# Patient Record
Sex: Female | Born: 1965 | Race: White | Hispanic: No | Marital: Married | Smoking: Former smoker
Health system: Southern US, Community
[De-identification: ages and names within clinical notes are randomized; demographics above are authoritative.]

## PROBLEM LIST (undated history)

## (undated) DIAGNOSIS — K5790 Diverticulosis of intestine, part unspecified, without perforation or abscess without bleeding: Secondary | ICD-10-CM

## (undated) DIAGNOSIS — F329 Major depressive disorder, single episode, unspecified: Secondary | ICD-10-CM

## (undated) DIAGNOSIS — R112 Nausea with vomiting, unspecified: Secondary | ICD-10-CM

## (undated) DIAGNOSIS — Z9889 Other specified postprocedural states: Secondary | ICD-10-CM

## (undated) DIAGNOSIS — F988 Other specified behavioral and emotional disorders with onset usually occurring in childhood and adolescence: Secondary | ICD-10-CM

## (undated) DIAGNOSIS — K315 Obstruction of duodenum: Secondary | ICD-10-CM

## (undated) DIAGNOSIS — G43909 Migraine, unspecified, not intractable, without status migrainosus: Secondary | ICD-10-CM

## (undated) DIAGNOSIS — F419 Anxiety disorder, unspecified: Secondary | ICD-10-CM

## (undated) DIAGNOSIS — K219 Gastro-esophageal reflux disease without esophagitis: Secondary | ICD-10-CM

## (undated) DIAGNOSIS — R569 Unspecified convulsions: Secondary | ICD-10-CM

## (undated) DIAGNOSIS — I82409 Acute embolism and thrombosis of unspecified deep veins of unspecified lower extremity: Secondary | ICD-10-CM

## (undated) DIAGNOSIS — K859 Acute pancreatitis without necrosis or infection, unspecified: Secondary | ICD-10-CM

## (undated) DIAGNOSIS — J189 Pneumonia, unspecified organism: Secondary | ICD-10-CM

## (undated) DIAGNOSIS — F32A Depression, unspecified: Secondary | ICD-10-CM

## (undated) DIAGNOSIS — K2980 Duodenitis without bleeding: Secondary | ICD-10-CM

## (undated) HISTORY — DX: Acute pancreatitis without necrosis or infection, unspecified: K85.90

## (undated) HISTORY — DX: Migraine, unspecified, not intractable, without status migrainosus: G43.909

## (undated) HISTORY — DX: Duodenitis without bleeding: K29.80

## (undated) HISTORY — DX: Acute embolism and thrombosis of unspecified deep veins of unspecified lower extremity: I82.409

## (undated) HISTORY — PX: APPENDECTOMY: SHX54

## (undated) HISTORY — DX: Major depressive disorder, single episode, unspecified: F32.9

## (undated) HISTORY — DX: Obstruction of duodenum: K31.5

## (undated) HISTORY — PX: CHOLECYSTECTOMY: SHX55

## (undated) HISTORY — DX: Depression, unspecified: F32.A

## (undated) HISTORY — DX: Other specified behavioral and emotional disorders with onset usually occurring in childhood and adolescence: F98.8

## (undated) HISTORY — DX: Diverticulosis of intestine, part unspecified, without perforation or abscess without bleeding: K57.90

## (undated) HISTORY — DX: Gastro-esophageal reflux disease without esophagitis: K21.9

## (undated) HISTORY — DX: Unspecified convulsions: R56.9

---

## 1997-09-27 ENCOUNTER — Inpatient Hospital Stay (HOSPITAL_COMMUNITY): Admission: AD | Admit: 1997-09-27 | Discharge: 1997-09-29 | Payer: Self-pay | Admitting: *Deleted

## 1997-09-30 ENCOUNTER — Encounter: Admission: RE | Admit: 1997-09-30 | Discharge: 1997-12-29 | Payer: Self-pay | Admitting: *Deleted

## 1998-12-25 ENCOUNTER — Other Ambulatory Visit: Admission: RE | Admit: 1998-12-25 | Discharge: 1998-12-25 | Payer: Self-pay | Admitting: *Deleted

## 1999-03-23 ENCOUNTER — Encounter: Payer: Self-pay | Admitting: Emergency Medicine

## 1999-03-23 ENCOUNTER — Emergency Department (HOSPITAL_COMMUNITY): Admission: EM | Admit: 1999-03-23 | Discharge: 1999-03-23 | Payer: Self-pay | Admitting: Emergency Medicine

## 1999-08-04 ENCOUNTER — Encounter: Payer: Self-pay | Admitting: Obstetrics and Gynecology

## 1999-08-04 ENCOUNTER — Inpatient Hospital Stay (HOSPITAL_COMMUNITY): Admission: AD | Admit: 1999-08-04 | Discharge: 1999-08-04 | Payer: Self-pay | Admitting: *Deleted

## 1999-08-05 ENCOUNTER — Inpatient Hospital Stay (HOSPITAL_COMMUNITY): Admission: AD | Admit: 1999-08-05 | Discharge: 1999-08-05 | Payer: Self-pay | Admitting: Obstetrics and Gynecology

## 1999-08-27 ENCOUNTER — Inpatient Hospital Stay (HOSPITAL_COMMUNITY): Admission: AD | Admit: 1999-08-27 | Discharge: 1999-08-29 | Payer: Self-pay | Admitting: *Deleted

## 1999-10-12 ENCOUNTER — Other Ambulatory Visit: Admission: RE | Admit: 1999-10-12 | Discharge: 1999-10-12 | Payer: Self-pay | Admitting: *Deleted

## 1999-10-17 ENCOUNTER — Ambulatory Visit (HOSPITAL_COMMUNITY): Admission: RE | Admit: 1999-10-17 | Discharge: 1999-10-18 | Payer: Self-pay | Admitting: General Surgery

## 1999-10-17 ENCOUNTER — Encounter (INDEPENDENT_AMBULATORY_CARE_PROVIDER_SITE_OTHER): Payer: Self-pay | Admitting: *Deleted

## 2000-10-13 ENCOUNTER — Other Ambulatory Visit: Admission: RE | Admit: 2000-10-13 | Discharge: 2000-10-13 | Payer: Self-pay | Admitting: *Deleted

## 2001-01-07 ENCOUNTER — Encounter: Payer: Self-pay | Admitting: *Deleted

## 2001-01-07 ENCOUNTER — Encounter: Admission: RE | Admit: 2001-01-07 | Discharge: 2001-01-07 | Payer: Self-pay | Admitting: *Deleted

## 2004-10-30 ENCOUNTER — Other Ambulatory Visit: Admission: RE | Admit: 2004-10-30 | Discharge: 2004-10-30 | Payer: Self-pay | Admitting: Obstetrics and Gynecology

## 2007-07-17 ENCOUNTER — Ambulatory Visit: Payer: Self-pay | Admitting: Internal Medicine

## 2007-07-21 ENCOUNTER — Ambulatory Visit: Payer: Self-pay | Admitting: Cardiology

## 2007-12-01 ENCOUNTER — Emergency Department (HOSPITAL_COMMUNITY): Admission: EM | Admit: 2007-12-01 | Discharge: 2007-12-01 | Payer: Self-pay | Admitting: Family Medicine

## 2007-12-04 ENCOUNTER — Emergency Department (HOSPITAL_COMMUNITY): Admission: EM | Admit: 2007-12-04 | Discharge: 2007-12-04 | Payer: Self-pay | Admitting: Emergency Medicine

## 2008-02-09 ENCOUNTER — Emergency Department (HOSPITAL_COMMUNITY): Admission: EM | Admit: 2008-02-09 | Discharge: 2008-02-09 | Payer: Self-pay | Admitting: Emergency Medicine

## 2008-02-23 ENCOUNTER — Ambulatory Visit: Payer: Self-pay | Admitting: Vascular Surgery

## 2008-02-23 ENCOUNTER — Encounter (INDEPENDENT_AMBULATORY_CARE_PROVIDER_SITE_OTHER): Payer: Self-pay | Admitting: Orthopaedic Surgery

## 2008-02-23 ENCOUNTER — Ambulatory Visit: Admission: RE | Admit: 2008-02-23 | Discharge: 2008-02-23 | Payer: Self-pay | Admitting: Orthopaedic Surgery

## 2009-08-21 ENCOUNTER — Telehealth: Payer: Self-pay | Admitting: Internal Medicine

## 2009-08-21 ENCOUNTER — Ambulatory Visit: Payer: Self-pay | Admitting: Gastroenterology

## 2009-08-21 DIAGNOSIS — R112 Nausea with vomiting, unspecified: Secondary | ICD-10-CM

## 2009-08-21 DIAGNOSIS — R1013 Epigastric pain: Secondary | ICD-10-CM | POA: Insufficient documentation

## 2009-08-22 ENCOUNTER — Ambulatory Visit (HOSPITAL_COMMUNITY): Admission: RE | Admit: 2009-08-22 | Discharge: 2009-08-22 | Payer: Self-pay | Admitting: Gastroenterology

## 2009-08-22 LAB — CONVERTED CEMR LAB
Albumin: 4.5 g/dL (ref 3.5–5.2)
Alkaline Phosphatase: 52 units/L (ref 39–117)
BUN: 15 mg/dL (ref 6–23)
Basophils Relative: 0.8 % (ref 0.0–3.0)
Eosinophils Relative: 1.8 % (ref 0.0–5.0)
GFR calc non Af Amer: 97 mL/min (ref >60)
Glucose, Bld: 90 mg/dL (ref 70–99)
MCV: 96.9 fL (ref 78.0–100.0)
Monocytes Absolute: 0.4 10*3/uL (ref 0.1–1.0)
Monocytes Relative: 5.2 % (ref 3.0–12.0)
Neutrophils Relative %: 65.5 % (ref 43.0–77.0)
RBC: 4.12 M/uL (ref 3.87–5.11)
Total Bilirubin: 0.8 mg/dL (ref 0.3–1.2)
WBC: 6.8 10*3/uL (ref 4.5–10.5)

## 2009-08-29 ENCOUNTER — Encounter: Payer: Self-pay | Admitting: Internal Medicine

## 2009-08-29 ENCOUNTER — Telehealth: Payer: Self-pay | Admitting: Physician Assistant

## 2009-08-30 ENCOUNTER — Ambulatory Visit: Payer: Self-pay | Admitting: Gastroenterology

## 2009-08-30 ENCOUNTER — Encounter: Payer: Self-pay | Admitting: Internal Medicine

## 2009-08-31 ENCOUNTER — Telehealth: Payer: Self-pay | Admitting: Internal Medicine

## 2009-09-04 ENCOUNTER — Ambulatory Visit (HOSPITAL_COMMUNITY): Admission: RE | Admit: 2009-09-04 | Discharge: 2009-09-04 | Payer: Self-pay | Admitting: Internal Medicine

## 2009-09-05 ENCOUNTER — Ambulatory Visit: Payer: Self-pay | Admitting: Internal Medicine

## 2009-09-05 DIAGNOSIS — R1011 Right upper quadrant pain: Secondary | ICD-10-CM

## 2009-09-06 ENCOUNTER — Encounter: Payer: Self-pay | Admitting: Nurse Practitioner

## 2009-09-06 ENCOUNTER — Telehealth: Payer: Self-pay | Admitting: Nurse Practitioner

## 2009-09-25 ENCOUNTER — Ambulatory Visit: Payer: Self-pay | Admitting: Internal Medicine

## 2009-09-26 LAB — CONVERTED CEMR LAB
ALT: 24 units/L (ref 0–35)
AST: 23 units/L (ref 0–37)
Alkaline Phosphatase: 55 units/L (ref 39–117)
Amylase: 75 units/L (ref 27–131)
Bilirubin, Direct: 0.1 mg/dL (ref 0.0–0.3)
Total Bilirubin: 0.5 mg/dL (ref 0.3–1.2)

## 2010-09-11 NOTE — Assessment & Plan Note (Signed)
Summary: WORSENING EPISODES OF EPIGASTRIC PAIN,N/V    (DR.BRODIE PT.) ...   History of Present Illness Visit Type: Follow-up Visit Primary GI MD: Lina Sar MD Primary Provider: Dory Peru Requesting Provider: n/a Chief Complaint: Nausea and vomiting with abdominal pain that radiates to her chest, pt states she had a HPylori test with Dr Norma Fredrickson that was neg. 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 SHE WAS ABLE TO COME OFF MEDS AND DID NOT HAVE ANY SXS REFERRABLE TO IBD FOR MANY YEARS. SHE WAS SEEN BY DR Juanda Chance IN 2008-HAD CT ABDOMEN PELVIS WHICH WAS NORMAL. 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 SHE DID HAVE GALLSTONES. SHE COMES IN TODAY WITH C/O EPIGASTRIC PAIN, NAUSEA AND VOMIITNG WHICH HAS OCCURRED IN ATTACKS WHICH REMINDS HER OF HER GB SXS. SHE HAS AN EPISODE THIS SUMMER, ONE IN THE FALL AND ANOTHER ABOUT A WEEK AGO. SHE GETS HORRIBLE EPIGASTRIC PAIN THAT RADIATES INTO RIGHT SIDE UNDER BREAST AND SOMETIMES INTO RIGHT SHOULDER OR MID BACK-THIS IS ASSOCIATED WITH NAUSEA AND MULTIPLE EPISODES OF VOMITING. THIS LASTS A DAY THEN SHE WILL FEEL SORE AND NAUSEATED FOR ABOUT A WEEK.. NO FEVER. SHE FEELS 'UNSETTLED', 'ACIDY' IN HER STOMACH. HAS BEEN ON PREVACID FOR A FEW MONTHS, THINKS IT HELPS A LITTLE.  NO REGULAR ASA OR NSAIDS.    GI Review of Systems    Reports abdominal pain, belching, nausea, and  vomiting.     Location of  Abdominal pain: epigastric area.    Denies acid reflux, bloating, chest pain, dysphagia with liquids, dysphagia with solids, heartburn, loss of appetite, vomiting blood, and  weight loss.        Denies anal fissure, black tarry stools, change in bowel habit, constipation, diarrhea, diverticulosis, fecal incontinence,  heme positive stool, hemorrhoids, irritable bowel syndrome, jaundice, light color stool, liver problems, rectal bleeding, and  rectal pain. Preventive Screening-Counseling & Management  Alcohol-Tobacco     Smoking Status: quit      Drug Use:  no.     Current Medications (verified): 1)  Prevacid 30 Mg Cpdr (Lansoprazole) .... Take 1 Tab 30 Min Prior To Breakfast 2)  Promethazine Hcl 25 Mg Tabs (Promethazine Hcl) .... Take 1 Tab Every 6 Hours As Needed For Nausea.  Allergies (verified): No Known Drug Allergies  Past History:  Past Medical History: MIGRAINE HEADACHES  Past Surgical History: Reviewed history from 08/29/2007 and no changes required. Appendectomy Cholecystectomy  Family History: Family History of Colon Cancer: cousin Mother had lung cancer Family History of Diabetes:  father Family History of Heart Disease: fatherv  Social History: [name].  Alcohol Use - yes  occ Daily Caffeine Use coffee daily Illicit Drug Use - no Smoking Status:  quit Drug Use:  no  Review of Systems  The patient denies allergy/sinus, anemia, anxiety-new, arthritis/joint pain, back pain, blood in urine, breast changes/lumps, change in vision, confusion, cough, coughing up blood, depression-new, fainting, fatigue, fever, headaches-new, hearing problems, heart murmur, heart rhythm changes, itching, menstrual pain, muscle pains/cramps, night sweats, nosebleeds, pregnancy symptoms, shortness of breath, skin rash, sleeping problems, sore throat, swelling of feet/legs, swollen lymph glands, thirst - excessive, urination - excessive, urination changes/pain, urine leakage, vision changes, and voice change.  Vital Signs:  Patient profile:   45 year old female Height:      64 inches Weight:      144 pounds BMI:     24.81 BSA:     1.70 Pulse rate:   78 / minute Pulse rhythm:   regular BP sitting:   84 / 60  (left arm)  Vitals Entered By: Merri Ray CMA Duncan Dull)  (August 21, 2009 3:12 PM)  Physical Exam  General:  Well developed, well nourished, no acute distress. Head:  Normocephalic and atraumatic. Eyes:  PERRLA, no icterus. Lungs:  Clear throughout to auscultation. Heart:  Regular rate and rhythm; no murmurs, rubs,  or bruits. Abdomen:  soft, tender epigastrium,and ruq,no guarding,exam brought tears.Marland Kitchen bs+ no mass or hsm Rectal:  not done Extremities:  No clubbing, cyanosis, edema or deformities noted. Neurologic:  Alert and  oriented x4;  grossly normal neurologically. Psych:  Alert and cooperative. Normal mood and affect.   Impression & Recommendations:  Problem # 1:  ABDOMINAL PAIN, EPIGASTRIC (ICD-789.06) Assessment Deteriorated [name] S/P CHOLECYTECTOMY AND APPENDECTOMY WITH  EPISODIC EPIGASTRIC PAIN, NAUSEA AND VOMITING. R/O CHOLEDOCHOLITHIASIS, SPHINCTER OF ODDI DYSFUNCTION, IBS, PARTIAL OBSTRUCTION LABS AS BELOW RESUME PREVACID 30 MG DIALY IN AM BLAND DIET SCHEDULE UPPER ABDOMINAL ULTRASOUND,MAY NEED MRCP VS ERCP PENDING FINDINGS PHENERGAN 25 MG Q 6 HOURS AS NEEDED FOR NAUSEA Orders: TLB-CMP (Comprehensive Metabolic Pnl) (80053-COMP) TLB-CBC Platelet - w/Differential (85025-CBCD) TLB-Lipase (83690-LIPASE) Ultrasound Abdomen (UAS)  Patient Instructions: 1)  Please go to the lab, basement level. 2)  We scheduled an Ultrasound for tomorrow 08-22-09. Arrive at [facility] 1st floor at 10:45AM. 3)  We sent a perscription for Phenergan for Nausea to your pharmacy. 4)  We have given you samples of Prevacid, take 1 capsule 30 min before breakfast. 5)  Copy sent to : [name], MD 6)  The medication list was reviewed and reconciled.  All changed / newly prescribed medications were explained.  A complete medication list was provided to the patient / caregiver.  Prescriptions: PROMETHAZINE HCL 25 MG TABS (PROMETHAZINE HCL) Take 1 tab every 6 hours as needed for nausea.  #60 x 0   Entered by:   Lowry Ram  NCMA   Authorized by:   Sammuel Cooper PA-c   Signed by:   Lowry Ram NCMA on 08/21/2009   Method used:   Electronically to        [facility]  [id number] (retail)       [address]       [place], [place]  [address]       Ph: [phone] or [phone]       Fax: [phone]   RxID:   [id number]

## 2010-09-11 NOTE — Progress Notes (Signed)
Summary: MRCP Scheduled  Phone Note Outgoing Call   Call placed by: Laureen Ochs LPN,  August 31, 2009 2:27 PM Call placed to: Patient Summary of Call: Pt. needs an MRCP and OV. She is scheduled at [facility] on 09-04-09 at [facility] for the MRCP and will f/u w/Dr.Dayelin Balducci on 09-25-09 at 2:15am. All instructions reviewed w/pt. by phone. Pt. instructed to call back as needed.  Initial call taken by: Laureen Ochs LPN,  August 31, 2009 2:35 PM

## 2010-09-11 NOTE — Letter (Signed)
Summary: Out of [facility]  [address] [place], [place] [address]   Phone: [phone]  Fax: [phone]    September 06, 2009   Student:  Alycia Patten Ficken    To Whom It May Concern:   For Medical reasons, please excuse the above named student from school for the following dates:              Our patient Cassie Berry had an Endoscopy on 08-30-09.    Our patient Cassie Berry had a procedure, MRCP,  on September 04, 2009 .             I also saw [name] in our office on 09-05-09.                If you need additional information, please feel free to contact our office.   Sincerely,        ****This is a legal document and cannot be tampered with.  Schools are authorized to verify all information and to do so accordingly.

## 2010-09-11 NOTE — Procedures (Signed)
Summary: Upper Endoscopy  Patient: Cassie Berry Note: All result statuses are Final unless otherwise noted.  Tests: (1) Upper Endoscopy (EGD)   EGD Upper Endoscopy       DONE     [facility]     [address]     [place], [place]  [address]           ENDOSCOPY PROCEDURE REPORT           PATIENT:  [name], [name]  MR#:  [id number]     BIRTHDATE:  11-25-1965, 43 yrs. old  GENDER:  female           ENDOSCOPIST:  Judie Petit T. Russella Dar, MD, [facility]           PROCEDURE DATE:  08/30/2009     PROCEDURE:  EGD, diagnostic     ASA CLASS:  Class I     INDICATIONS:  abdominal pain, right upper quad, chest pain, nausea     and vomiting           MEDICATIONS:  Fentanyl 50 mcg IV, Versed 8 mg IV     TOPICAL ANESTHETIC:  Exactacain Spray           DESCRIPTION OF PROCEDURE:   After the risks benefits and     alternatives of the procedure were thoroughly explained, informed     consent was obtained.  The LB [id number] endoscope was     introduced through the mouth and advanced to the second portion of     the duodenum, without limitations.  The instrument was slowly     withdrawn as the mucosa was fully examined.     <<PROCEDUREIMAGES>>           The esophagus and gastroesophageal junction were completely normal     in appearance. The stomach was entered and closely examined. The     pylorus, antrum, angularis, and lesser curvature were well     visualized, including a retroflexed view of the cardia and fundus.     The stomach wall was normally distensable. The scope passed easily     through the pylorus into the duodenum. The duodenal bulb was     normal in appearance, as was the postbulbar duodenum. Retroflexed     views revealed no abnormalities.  The scope was then withdrawn     from the patient and the procedure completed.           COMPLICATIONS:  None           ENDOSCOPIC IMPRESSION:     1) Normal EGD           RECOMMENDATIONS:     1) Continue current medications     2)  My office will arrange for you to have an MRCP performed for     recurrent RUQ pain, N/V, postcholecystectomy. This is a     specialized MRI test of your bile and pancreatic ducts.     3) Call office in the next few days to schedule an office     appointment for 2-3 weeks with Dr. Jettie Pagan T. Russella Dar, MD, [facility]           CC:  Romero Liner, MD      Lina Sar, MD           n.     Rosalie DoctorVenita Lick. Bay Jarquin at 08/30/2009 03:23 PM  [place], [place], [phone]  Note: An exclamation mark (!) indicates a result that was not dispersed into the flowsheet. Document Creation Date: 08/30/2009 3:24 PM _______________________________________________________________________  (1) Order result status: Final Collection or observation date-time: 08/30/2009 15:19 Requested date-time:  Receipt date-time:  Reported date-time:  Referring Physician:   Ordering Physician: Claudette Head [phone]) Specimen Source:  Source: Launa Grill Order Number: [id number] Lab site:

## 2010-09-11 NOTE — Progress Notes (Signed)
Summary: TRIAGE  Phone Note Call from Patient Call back at [phone]   Caller: Patient Call For: Dr. Juanda Chance Summary of Call: Pt is having alot of abdominal pain and nausea. Scheduled an appt. for 09-25-09 but wants to know if she can be worked in sooner. Initial call taken by: Karna Christmas,  August 21, 2009 10:14 AM  Follow-up for Phone Call        Pt. c/o intermittent epigastric pain, when she has these episodes the pain is under her rib cage and travels to the center of her chest. Pain is in the middle and goes to her right. N/V, bloating and belching with the pain.  Occ. constipation. Denies fever, diarrhea, blood, black stools. Was started on Prevacid in July, helped at first, but not recently. States these episodes are getting more frequent and more severe, had one this weekend.  Pt. will see Mike Gip [facility] today at 3pm.  Follow-up by: Laureen Ochs LPN,  August 21, 2009 10:48 AM

## 2010-09-11 NOTE — Progress Notes (Signed)
Summary: Needs a letter for school  Phone Note Call from Patient Call back at Home Phone [phone]   Call For: Cassie Berry Summary of Call: Was seen here yesterday and needs a letter for her school. would really like to speak to [facility] first. Initial call taken by: Leanor Kail [facility],  September 06, 2009 12:15 PM  Follow-up for Phone Call        Gave pt out of school letter for several recent dates.  Put at front desk today. Follow-up by: Joselyn Glassman,  September 06, 2009 12:40 PM

## 2010-09-11 NOTE — Assessment & Plan Note (Signed)
Summary: ABDOMINAL PAIN--CH.   History of Present Illness Visit Type: Follow-up Visit Primary GI MD: Lina Sar MD Primary Provider: [name], MD Requesting Provider: n/a Chief Complaint: midline abdominal pain that radiates to right side.  Since making appt pain is not radiating as much. History of Present Illness:   45 y.o. white female with episodic epigastric pain localized to the right of the epigastrium and under the right costal margin resembling prior  gallbladder attack. Upper endoscopy by Dr. Russella Dar was normal and MRCP was also normal including common bile duct and pancreas. She had a  laparoscopic cholecystectomy 2001 for cholelithiasis which was uncomplicated. CT Scan of the abdomen in December 2008 was normal as a positive family history for colon cancer in paternal grandfather. Patient underwent acute explorratory laparotomy in 1989  while vacationing at the beach was no definite diagnosis made and she underwent to incidental appendectomy   GI Review of Systems    Reports abdominal pain and  belching.     Location of  Abdominal pain: generalized.    Denies acid reflux, bloating, chest pain, dysphagia with liquids, dysphagia with solids, heartburn, loss of appetite, nausea, vomiting, vomiting blood, weight loss, and  weight gain.        Denies anal fissure, black tarry stools, change in bowel habit, constipation, diarrhea, diverticulosis, fecal incontinence, heme positive stool, hemorrhoids, irritable bowel syndrome, jaundice, light color stool, liver problems, rectal bleeding, and  rectal pain.    Current Medications (verified): 1)  Prevacid 30 Mg Cpdr (Lansoprazole) .... Take 1 Tab 30 Min Prior To Breakfast 2)  Promethazine Hcl 25 Mg Tabs (Promethazine Hcl) .... Take 1 Tab Every 6 Hours As Needed For Nausea. 3)  Levbid 0.375 Mg Xr12h-Tab (Hyoscyamine Sulfate) .... Take 1 Tab Twice Daily 4)  Epidrin 325-65-100 Mg Caps (Apap-Isometheptene-Dichloral) .... As Needed For  Migraines  Allergies (verified): 1)  ! Macrodantin  Past History:  Past Medical History: Reviewed history from 09/19/2009 and no changes required. MIGRAINE HEADACHES ABDOMINAL PAIN-RUQ (ICD-789.01) NAUSEA AND VOMITING (ICD-787.01) ABDOMINAL PAIN, EPIGASTRIC (ICD-789.06) COLORECTAL CANCER, FAMILY HX (ICD-V16.0)    Past Surgical History: Reviewed history from 08/29/2007 and no changes required. Appendectomy Cholecystectomy  Family History: Reviewed history from 09/19/2009 and no changes required. Family History of Colon Cancer: cousin, Paternal Grandfather Mother had lung cancer Family History of Diabetes:  father Family History of Heart Disease: father Family History of Prostate Cancer: Grandfather Family History of Breast Cancer: Maternal Aunt  Social History: Reviewed history from 08/21/2009 and no changes required. [name].  Alcohol Use - yes  occ Daily Caffeine Use coffee daily Illicit Drug Use - no  Review of Systems  The patient denies allergy/sinus, anemia, anxiety-new, arthritis/joint pain, back pain, blood in urine, breast changes/lumps, confusion, cough, coughing up blood, depression-new, fainting, fatigue, fever, headaches-new, hearing problems, heart murmur, heart rhythm changes, itching, menstrual pain, muscle pains/cramps, night sweats, nosebleeds, pregnancy symptoms, shortness of breath, skin rash, sleeping problems, sore throat, swelling of feet/legs, swollen lymph glands, thirst - excessive, urination - excessive, urination changes/pain, urine leakage, vision changes, and voice change.         Pertinent positive and negative review of systems were noted in the above HPI. All other ROS was otherwise negative.   Vital Signs:  Patient profile:   45 year old female Height:      64 inches Weight:      144 pounds BMI:     24.81 Pulse rate:   76 /  minute Pulse rhythm:   regular BP sitting:   100 / 66  (left arm) Cuff size:    regular  Vitals Entered By: Francee Piccolo CMA Duncan Dull) (September 25, 2009 2:29 PM)  Physical Exam  General:  Well developed, well nourished, no acute distress. Eyes:  PERRLA, no icterus. Neck:  Supple; no masses or thyromegaly. Lungs:  Clear throughout to auscultation. Heart:  Regular rate and rhythm; no murmurs, rubs,  or bruits. Abdomen:  tenderness right upper quadrant under the right costal margin. No palpable mass. Normoactive bowel sounds. Left lower and upper quadrants unremarkable. There is no CVA tenderness Extremities:  No clubbing, cyanosis, edema or deformities noted. Skin:  Intact without significant lesions or rashes. Psych:  Alert and cooperative. Normal mood and affect.   Impression & Recommendations:  Problem # 1:  ABDOMINAL PAIN-RUQ (ICD-789.01)  Oepisodic right upper quadrant abdominal pain consistent with biliary colic in a patient who is status post laparoscopic cholecystectomy 2001 , I would consider all possibility of biliary dyskinesia or on sphincter of OD dysfunction or possibly choledocholithiasis. We would have to document abnormal liver function tests. She's doing fine today. Patient will remain on Prevacid 30 mg a day and low-fat diet. She was notified us if she has another attack and we will draw liver function tests. If she develops an acute attack she should  take Phenergan 12 and half milligrams and Levbid 0.375 mgrders: TLB-Hepatic/Liver Function Pnl (80076-HEPATIC) TLB-Amylase (82150-AMYL) TLB-Lipase (83690-LIPASE)  Problem # 2:  NAUSEA AND VOMITING (ICD-787.01) usually associated with abdominal pain may be treated symptomatically Orders: TLB-Hepatic/Liver Function Pnl (80076-HEPATIC) TLB-Amylase (82150-AMYL) TLB-Lipase (83690-LIPASE)  Problem # 3:  COLORECTAL CANCER, FAMILY HX (ICD-V16.0) recommend colonoscopy at age 89  Patient Instructions: 1)  colonoscopy at  age 67 2)  Low-fat diet 3)  Liver function tests today and while she's having  abdominal pain to  to rule out biliary colic, if elevated, will likely need ERCP/sphincterotomy 4)  Take Phenergan or  Levbid .375 mg p.r.n. abdominal pain 5)  Copy sent to : Dr W.Pharr

## 2010-09-11 NOTE — Progress Notes (Signed)
Summary: abdominal pain  Phone Note Call from Patient   Summary of Call: Some increase in the pain she has had for 3 weeks and in epigastrium with some radiation to back. No fever, nausea or vomiting at this time. Has just had [place], labs and EGD which were unrevealing (reviewed). Was able to work today but it was difficult. Plan for now is for her to go on liquid diet abnd we will call her tomorow AM re: follow-up needs Told  to go to ED if worse, fever, intractable vomiting  Initial call taken by: Iva Boop MD, [facility],  August 31, 2009 7:40 PM     Appended Document: abdominal pain Pt. has an MRCP scheduled for 09-04-09. Message left for patient to callback.  Appended Document: abdominal pain Message left for patient to callback.  Appended Document: abdominal pain Pt. states she is still hurting, but she is improved today.She will keep appt. for MRCP on 09-04-09. If symptoms become worse call back immediately or go to ER.

## 2010-09-11 NOTE — Progress Notes (Signed)
Summary: Urgent appt. -- Abdominal pain.  Phone Note From Other Clinic Call back at [phone]   Caller: Patient Caller: Nurse -Dr. Renne Crigler office.  Summary of Call: patient was just seen by Dr. Renne Crigler today and Dr. Renne Crigler states that her abdominal pain is I related and want patient to be seen again in GI within the next two days. I advised Dr. Shellia Cleverly nurse that the patient has severl up coming things scheduled already including a EGD and a follow up visit. She says she knows but wants patient seen again anyway. I told her I will have this note forwarded to the nurse and have someone call the patient.  Initial call taken by: Harlow Mares CMA Duncan Dull),  August 29, 2009 4:36 PM  Follow-up for Phone Call        Pt. was seen 08-21-09 by Mike Gip PAC.   AMY--PLEASE REVIEW AND ADVISE.   Follow-up by: Laureen Ochs LPN,  August 29, 2009 4:44 PM  Additional Follow-up for Phone Call Additional follow up Details #1::        SHERI,I SPOKE TO DR Russella Dar WHO SUPERVISED WITH ME ON THIS PT . SHE IS SET UP FOR EGD WITH DB 2/8,DR. STARK SAYS HE CAN DO IT THIS WEEK IF YOU CAN FIND A SPOT. PLEASE CALL PT IN AM- I DO NOT NEED TO SEE HER IN OFFICE. Additional Follow-up by: Peterson Ao,  August 29, 2009 5:12 PM     Appended Document: Urgent appt. -- Abdominal pain. I spoke with the patient this am she will come for EGD today with Dr Russella Dar at 3:30.  Patient  verbalized understanding of verbal instructions.   Clinical Lists Changes  Orders: Added new Test order of EGD (EGD) - Signed      Appended Document: Urgent appt. -- Abdominal pain. reviewed and agree.DB

## 2010-09-11 NOTE — Assessment & Plan Note (Signed)
Summary: ONGOING ABD.PAIN,N/V          (DR.BRODIE PT.)    Cassie Berry   History of Present Illness Primary GI MD: Lina Sar MD Primary Provider: Dory Peru Requesting Provider: n/a Chief Complaint: Ongoing RUQ dull pain and tenderness that can radiate to her chest and shoulder blades. Pt also still has some nausea. Pt states the phenergan is the only thing that helps.  History of Present Illness:   Cassie Berry is back for persistent upper abdominal pain for which she saw Amy Esterwood, P.A early January. U/S, EGD and labs normal. She describes RUQ "gnawing" which often radiates around right flank and sometimes up into chest. Pain not related to meals though she has early satiety without weight loss. The pain occurs almost daily, is constant when it occurs and pretty much lasts all day. Pain not exacerbated by physical activity. Her nausea and vomiting are better. She still takes a Phenergan at night, as well as uses a heating pad to promote sleep and not be woken up by the pain. Prevacid recently increased to twice daily, she hasn't noticed any improvement.    Cassie Berry was originally seen by Dr. Juanda Chance in 2008 for questionable Crohn's. In 1989, while away on her honeymoon, patient developed severe RLQ pain, had emergent exploratory laparotomy for presumed appendicitis. An appendectomy was carried out, but the findings of the surgery, according to the patient, were inflammation in her intestines.  She was subsequently treated for Crohn's by Dr. Victorino Dike. After two years patient felt fine and discontinued medications. Sounds like patient never convinced she had IBD and we never recieved records.  In 1995 or so, she saw Dr. Sherin Quarry, just one time in the office.  No tests were done. Though no major GI complaints at the time, patient saw Dr. Juanda Chance Dec. 2008 to determine whether she had Crohn's. A CTscan at that time was okay. No further GI problems until recent development of RUQ pain.  Patient has  normal BMs, no hematochezia. Weight is stable.     GI Review of Systems    Reports abdominal pain, chest pain, and  nausea.     Location of  Abdominal pain: RUQ.    Denies acid reflux, belching, bloating, dysphagia with liquids, dysphagia with solids, heartburn, loss of appetite, vomiting, vomiting blood, weight loss, and  weight gain.        Denies anal fissure, black tarry stools, change in bowel habit, constipation, diarrhea, diverticulosis, fecal incontinence, heme positive stool, hemorrhoids, irritable bowel syndrome, jaundice, light color stool, liver problems, rectal bleeding, and  rectal pain.    Current Medications (verified): 1)  Prevacid 30 Mg Cpdr (Lansoprazole) .... Take 1 Tab 30 Min Prior To Breakfast 2)  Promethazine Hcl 25 Mg Tabs (Promethazine Hcl) .... Take 1 Tab Every 6 Hours As Needed For Nausea.  Allergies (verified): 1)  ! Macrodantin  Past History:  Past Medical History: Reviewed history from 08/21/2009 and no changes required. MIGRAINE HEADACHES  Past Surgical History: Reviewed history from 08/29/2007 and no changes required. Appendectomy Cholecystectomy  Family History: Reviewed history from 08/21/2009 and no changes required. Family History of Colon Cancer: cousin Mother had lung cancer Family History of Diabetes:  father Family History of Heart Disease: fatherv  Social History: Reviewed history from 08/21/2009 and no changes required. [name].  Alcohol Use - yes  occ Daily Caffeine Use coffee daily Illicit Drug Use - no  Review of Systems  The patient denies allergy/sinus,  anemia, anxiety-new, arthritis/joint pain, back pain, blood in urine, breast changes/lumps, change in vision, confusion, cough, coughing up blood, depression-new, fainting, fatigue, fever, headaches-new, hearing problems, heart murmur, heart rhythm changes, itching, menstrual pain, muscle pains/cramps, night sweats, nosebleeds, pregnancy symptoms,  shortness of breath, skin rash, sleeping problems, sore throat, swelling of feet/legs, swollen lymph glands, thirst - excessive , urination - excessive , urination changes/pain, urine leakage, vision changes, and voice change.    Vital Signs:  Patient profile:   45 year old female Height:      64 inches Weight:      145.38 pounds BMI:     25.04 Pulse rate:   80 / minute Pulse rhythm:   regular BP sitting:   104 / 60  (left arm) Cuff size:   regular  Vitals Entered By: Christie Nottingham CMA Duncan Dull) (September 05, 2009 9:07 AM)  Physical Exam  General:  Well developed, well nourished, no acute distress. Eyes:  Conjunctiva pink, no icterus.  Neck:  no obvious masses  Lungs:  Clear throughout to auscultation. Heart:  Regular rate and rhythm; no murmurs, rubs,  or bruits. Abdomen:  Abdomen soft, nondistended. Very localized tenderness in RUQ a few millimeters below lap cholecystectomy port. Cannot reproduce pain with contraction of abdominal wall. No obvious masses or hepatomegaly.Normal bowel sounds.  Msk:  Symmetrical with no gross deformities. Normal posture. Extremities:  No palmar erythema, no edema.  Neurologic:  Alert and  oriented x4;  grossly normal neurologically. Skin:  Intact without significant lesions or rashes. Cervical Nodes:  No significant cervical adenopathy. Psych:  Pleasant but appears depressed about her ongoing pain.   Impression & Recommendations:  Problem # 1:  ABDOMINAL PAIN-RUQ (ICD-789.01) Assessment Unchanged Four week history of RUQ pain radiating around right flank, ocassionally into chest and associated with nausea and vomiting. Pain  reminiscent of gallbladder pain. History of cholecystectomy in 2001.  Her nausea is better. So far workup has included CMET, lipase, EGD, U/S and MRCP - all normal. She has very localized tenderness in RUQ. I don't think a CTscan would offer much so will not pursue one at this point. Patient  could have adhesions but she  is10 years years out from her laparascopic cholecystectomy so this seems un likely. She may have abdominal wall pain but not clear why the associated nausea and vomiting. At this point, her nausea is better. Will try Hyoscyamine. If no improvment, she will try low dose Flexeril. If pain persists, may need referral for trigger point injection. Decrease PPI to once daily. Patient will call in 7-10 days with medical update.   Patient Instructions: 1)  Please call us in 7-10 days with a progress repport. 2)  We sent a perscription for Levbid to [facility] [address] 3)  We have a perscription for Flexeril on hold at the pharmacy. 4)  Copy sent to : [name], MD 5)  The medication list was reviewed and reconciled.  All changed / newly prescribed medications were explained.  A complete medication list was provided to the patient / caregiver. Prescriptions: FLEXERIL 10 MG TABS (CYCLOBENZAPRINE HCL) Take 1/2 tab twice daily  #60 x 0   Entered by:   Lowry Ram NCMA   Authorized by:   [name] NP   Signed by:   Lowry Ram NCMA on 09/05/2009   Method used:   Printed then faxed to ...       [facility]  [id number] (retail)  [address]       [place], [place]  [address]       Ph: [phone] or [phone]       Fax: [phone]   RxID:   [id number] LEVBID 0.375 MG XR12H-TAB (HYOSCYAMINE SULFATE) Take 1 tab twice daily  #60 x 1   Entered by:   Lowry Ram NCMA   Authorized by:   [name] NP   Signed by:   Lowry Ram NCMA on 09/05/2009   Method used:   [facility] to        [facility]  [id number]       [address]       [place], [place]  [address]       Ph: [phone] or [phone]       Fax: [phone]   RxID:   [id number]

## 2010-11-28 ENCOUNTER — Ambulatory Visit (INDEPENDENT_AMBULATORY_CARE_PROVIDER_SITE_OTHER): Payer: 59 | Admitting: Sports Medicine

## 2010-11-28 VITALS — BP 115/76 | Ht 64.0 in | Wt 150.0 lb

## 2010-11-28 DIAGNOSIS — M76899 Other specified enthesopathies of unspecified lower limb, excluding foot: Secondary | ICD-10-CM

## 2010-11-28 DIAGNOSIS — M7060 Trochanteric bursitis, unspecified hip: Secondary | ICD-10-CM | POA: Insufficient documentation

## 2010-11-28 DIAGNOSIS — M25551 Pain in right hip: Secondary | ICD-10-CM | POA: Insufficient documentation

## 2010-11-28 DIAGNOSIS — M25559 Pain in unspecified hip: Secondary | ICD-10-CM

## 2010-11-28 DIAGNOSIS — M2170 Unequal limb length (acquired), unspecified site: Secondary | ICD-10-CM

## 2010-11-28 NOTE — Assessment & Plan Note (Signed)
A 2 cm wedge was added to her right shoe. After this she was able to walk with no Trendelenburg and with her shoulders and hips level. I would like her to use this over the next month. Consistently doing her exercise program. She may resume packing on the treadmill but for only 10 minutes at a time to alternate with the exercises. Recheck her in one month to see if she is improving her strength and to give her additional leg lengths pads to use if this is helping her symptoms.

## 2010-11-28 NOTE — Progress Notes (Signed)
  Subjective:    Patient ID: Cassie Berry, female    DOB: November 14, 1965, 45 y.o.   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 Longer shifts are harder Most pain is lateral Sometimes now has some groin and even buttocks area pain  No Xrays done and no other specific treatment  No regular exercise now as most things have caused this to hurt more  Meds Wellbutrin 200 mgm reg med Allergy meds and OTC NSAIDS   Review of Systems Migraines - used Midrin and this worked well/ now on Imitrex as midrin off market More hormonal assoc    Objective:   Physical Exam    no acute distress  Patient has normal hip range of motion on the right and left Strength in flexion abduction and abduction is equal on right and left Right hip rotation is very weak but the left is strong Pearlean Brownie test is very tight on the right and is normal on the left SI joints move well bilaterally Today there is minimal tenderness over the greater trochanter  Right leg is 2 cm shorter than the left Walking shows a drop of the right shoulder and hip that is subtle with only a very slight Trendelenburg    Assessment & Plan:

## 2010-11-28 NOTE — Assessment & Plan Note (Signed)
This is chronic and has been persistent for at least a year now. The last several months this is increased and it has limited her from exercising. 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 However, with the increased pain she has also had to decrease her walking and running and as such has gained about 20 pounds. She would like to get back into an exercise program that would help her lose the weight again.

## 2010-11-28 NOTE — Assessment & Plan Note (Signed)
I think she has had a chronic low-grade trochanteric bursitis that is not very inflamed today. This sounds like it was more significant when she was seen initially by Dr. Renne Crigler.  In any case with her weakness we will give her a series of hip exercises focusing on hip abduction lateral step up and crossover exercises. We will give her 2 stretches to do on a regular basis to improve the rotation and motion of the left hip.  A key trigger in this leg length inequality which has a strong association with pelvic and hip girdle pain.

## 2010-12-25 NOTE — Assessment & Plan Note (Signed)
HEALTHCARE                         GASTROENTEROLOGY OFFICE NOTE   MADALEINE, [name]                       MRN:          [id number]  DATE:07/17/2007                            DOB:          [birth year]    Ms. Cassie Berry is a 45 year old white female.  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  She is here today at Dr. Carolee Rota request to clarify  the question of her having Crohn's disease.  In 1989 at the age of 11,  she was on her [place]ymoon at the beach and developed severe right lower  quadrant pain, vomiting, and dehydration, necessitating emergency  admission and exploratory laparotomy for presumed appendicitis.  An  appendectomy was carried out, but the findings of the surgery, according  to the patient, were inflammation in her intestines.  She then returned  to [place] to be seen by Dr. Victorino Dike and treated for presumed  Crohn's disease.  Unfortunately, we do not have those records, either at  [facility] or in our office.  She remembers having a small bowel  follow-through and being treated with prednisone and subsequently with  Azulfidine for about two years.  She discontinued all of her medications  after about two years and did not return for followup.  She at that  point did not have any insurance.  In 1995 or so, she saw Dr. Sherin Quarry,  just one time in the office.  No tests were done.  She was told that she  did not have to take her medications.  Since then, she has occasional  diarrhea occurring about once or twice a month.  Her weight has been  stable.  She had two uneventful pregnancies.   There is a family history of colon cancer in her paternal grandfather  and paternal cousin.  There is no family history of inflammatory bowel  disease.   She denies having canker sores or any aphthous stomatitis, but she does  have occasional low back pain which she attributes to being a nurse,  turning patients, and doing heavy lifting.   MEDICATIONS:  1. Multivitamins.  2. Serevent p.o. daily.  3. B12 shots monthly.   PAST MEDICAL HISTORY:  1. Appendectomy.  2. Cholecystectomy for cholelithiasis in 2001.   FAMILY HISTORY:  Positive for breast cancer in a maternal aunt, diabetes  in father, prostate cancer in paternal grandfather, heart disease in  father.   SOCIAL HISTORY:  She is married with two children.  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  She does not smoke and drinks alcohol only  occasionally.   REVIEW OF SYSTEMS:  Positive for stable weight, eye glasses, allergies,  frequent cough, slight anemia, based on Dr. Carolee Rota report.  She has  some premenopausal night sweats and back pain.   PHYSICAL EXAMINATION:  Blood pressure 100/60, pulse 76, weight 136  pounds.  She appears healthy in no distress.  Oral cavity shows papillated tongue.  No cheilosis.  No aphthous  ulcerations.  NECK:  Supple.  No adenopathy.  LUNGS:  Clear to auscultation.  COR:  Normal  precordium.  Normal S1, normal S2.  ABDOMEN:  Soft with a well-healed post appendectomy scar in the right  lower quadrant.  Bowel sounds were normoactive.  There were no abnormal  rushes.  The lower abdomen was normal.  Liver edge was at the costal  margin.  Post laparoscopic cholecystectomy in periumbilical area.  No  CVA tenderness.  RECTAL:  There is no perianal disease.  Rectal tone was normal.  Stool  was hemoccult negative.   Laboratory data from Dr. Carolee Rota office showed hemoglobin to be 12.6,  hematocrit 35.7 with MCV of 88 and white cell count of 4900 and platelet  count 283,000.  Her liver function tests were normal.  Serum albumin was  4.2.   IMPRESSION:  A 45 year old white female who is in good health and  asymptomatic  Her medical history is unclear.  We will make an attempt  to obtain some of the records possibly from Dr. Sherin Quarry, at least the  op note from 1989 or possibly the small bowel follow-through report.  As  far as any  current GI workup, we have a choice of either doing a  colonoscopy or CT scan of the abdomen with attention to the right lower  quadrants.  I have discussed these options with the patient, and I  believe the CT scan would have more likelihood of diagnosing Crohn's  disease of the small bowel because it would visualize the terminal ileum  better than colonoscopy.  A colonoscopy would be useful if the patient  was having colitis, but according to her history , her disease was in  the small bowel.  She currently has no symptoms of intestinal  obstruction; therefore, no particular treatment is necessary unless the  CT scan shows narrowing or thickening of the terminal ileum, which could  be consistent with inflammatory bowel disease.  In that case, we would  proceed with further evaluation, including Prometheus profile to check  for IBD antibodies, sed rate, B12 levels, etc.   As far as future colonoscopy is concerned, she is 45 years old and ought  to have a colonic exam before the age of 72 or depending on the results  of the CT scan of the abdomen, if there is any question of active  Crohn's disease.     [name]. Juanda Chance, MD  Electronically Signed    DMB/MedQ  DD: 07/17/2007  DT: 07/18/2007  Job #: [id number]   cc:   Soyla Genet. Renne Crigler, M.D.

## 2010-12-28 NOTE — Op Note (Signed)
[place]  Patient:    Cassie Berry, Cassie Berry                       MRN: [id number] Proc. Date: 10/17/99 Adm. Date:  [id number] Disc. Date: [id number] Attending:  Tempie Donning CC:         Soyla Morace. Renne Crigler, M.D.                           Operative Report  PREOPERATIVE DIAGNOSIS:  Cholelithiasis, cholecystitis.  POSTOPERATIVE DIAGNOSIS:  Cholelithiasis, cholecystitis.  OPERATION PERFORMED:  Laparoscopic cholecystectomy.  SURGEON:  Gita Kudo, M.D.  ASSISTANT:  Donnie Coffin. Samuella Cota, M.D.  ANESTHESIA:  General endotracheal.  INDICATIONS FOR PROCEDURE:  The patient is a 45 year old female with a recent acute attack of right upper quadrant abdominal pain. She gives a significant history f fatty food intolerance and no family history of gallbladder disease. Gallbladder ultrasound consistent with stones and liver function studies were normal.  OPERATIVE FINDINGS:  The patients liver looked totally normal.  The gallbladder  was not inflamed and was shiny.  There were no adhesions to it.  The cystic duct and artery were each normal in anatomy and size.  DESCRIPTION OF PROCEDURE:  Under satisfactory general endotracheal anesthesia, having received 1.0 gm Ancef preoperatively, the patients abdomen was prepped and draped in a standard fashion.  A transverse incision was made above the umbilicus and the midline opened into the peritoneum.  This was controlled with a figure-of-eight 0 Vicryl suture and then Hasson operating port inserted and secured.  Good CO2 pneumoperitoneum was established and camera placed.  Under direct vision, two #5 ports placed laterally and a second #10 port medially. Graspers placed through the lateral ports, gave excellent exposure and operating through the medial port, I dissected the gallbladder cystic duct junction. When I was sure of the anatomy, using a right angle clamp to circumferentially dissect  both the  cystic duct and cystic artery, we then controlled these positively identified structures with multiple metal clips and divided between the distal wo. A second posterior cystic artery branch was likewise clipped.  Then the gallbladder was removed from below upward using the coagulating spatula for hemostasis and dissection.  The liver bed was made dry by cautery and lavaged with saline and suctioned dry.  The gallbladder was then severed.  The camera was moved to the upper port and a large grasper placed through the umbilical port and used to extract the gallbladder intact and without spillage r complication.  Following this, the operative site was again lavaged and suctioned dry.  All ports and CO2 were released.  Each site was infiltrated with Marcaine for postoperative analgesia.  The midline closed with a previous suture plus a second interrupted 0 Vicryl suture.  Subcutaneous tissues approximated with 4-0 Vicryl and skin edges with Steri-Strips.  Sterile absorbent dressings were applied.  The patient then went to the recovery room the operating room in good condition with sponge and needle count correct. DD:  10/17/99 TD:  10/18/99 Job: [id number]

## 2011-01-02 ENCOUNTER — Ambulatory Visit (INDEPENDENT_AMBULATORY_CARE_PROVIDER_SITE_OTHER): Payer: 59 | Admitting: Family Medicine

## 2011-01-02 DIAGNOSIS — M7060 Trochanteric bursitis, unspecified hip: Secondary | ICD-10-CM

## 2011-01-02 DIAGNOSIS — M2170 Unequal limb length (acquired), unspecified site: Secondary | ICD-10-CM

## 2011-01-02 DIAGNOSIS — M76899 Other specified enthesopathies of unspecified lower limb, excluding foot: Secondary | ICD-10-CM

## 2011-01-02 NOTE — Progress Notes (Signed)
  Subjective:    Patient ID: Cassie Berry, female    DOB: Aug 30, 1965, 45 y.o.   MRN: [id number]  HPI Urvi comes in to followup on her right lateral hip pain and leg length difference. She has been doing her stretches and hip strengthening exercises. She was also given a 2 cm heel wedge for the right shoe at last visit and states this is helping. She is very pleased that she now has days where she has no pain at all. Even the days that she does have pain, it is 60-70% better. 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  Still no groin pain. Occasionally will get some mild numbness in her toes.   Review of Systems Denies fever, sweats, chills, weight loss    Objective:   Physical Exam Gen. appearance: Well-appearing female in no distress Right hip: Full range of motion without pain. Mild tenderness over the right greater trochanter. Negative log roll FAI impingement test. Improved strength on hip abduction and external rotation. Leg lengths: Right leg still 1.5-2 cm shorter than the left Back: Negative seated straight leg raise. Normal strength.       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

## 2011-05-07 LAB — POCT URINALYSIS DIP (DEVICE)
Bilirubin Urine: NEGATIVE
Glucose, UA: NEGATIVE
Ketones, ur: 40 — AB
Nitrite: NEGATIVE
Operator id: 235561
Protein, ur: NEGATIVE
Specific Gravity, Urine: 1.015
Urobilinogen, UA: 0.2
pH: 8

## 2011-05-07 LAB — URINALYSIS, MICROSCOPIC ONLY
Glucose, UA: NEGATIVE
Leukocytes, UA: NEGATIVE
Nitrite: NEGATIVE
Specific Gravity, Urine: 1.017
pH: 8

## 2011-05-07 LAB — URINALYSIS, ROUTINE W REFLEX MICROSCOPIC
Bilirubin Urine: NEGATIVE
Glucose, UA: NEGATIVE
Hgb urine dipstick: NEGATIVE
Specific Gravity, Urine: 1.007
pH: 6.5

## 2011-05-07 LAB — BASIC METABOLIC PANEL
BUN: 8
CO2: 25
CO2: 29
Chloride: 106
Chloride: 99
Creatinine, Ser: 0.64
GFR calc Af Amer: >60
Glucose, Bld: 85
Potassium: 3.5
Potassium: 3.8
Sodium: 139

## 2011-05-07 LAB — CBC
HCT: 33.2 — ABNORMAL LOW
HCT: 34.7 — ABNORMAL LOW
Hemoglobin: 11.5 — ABNORMAL LOW
Hemoglobin: 11.8 — ABNORMAL LOW
MCHC: 34
MCHC: 34.6
MCV: 89.7
MCV: 90.9
Platelets: 190
Platelets: 229
RBC: 3.71 — ABNORMAL LOW
RBC: 3.82 — ABNORMAL LOW
RDW: 12.4
RDW: 12.6
WBC: 3.4 — ABNORMAL LOW
WBC: 6.3

## 2011-05-07 LAB — DIFFERENTIAL
Basophils Relative: 0
Basophils Relative: 0
Eosinophils Absolute: 0
Eosinophils Absolute: 0.1
Eosinophils Relative: 1
Eosinophils Relative: 4
Lymphs Abs: 0.3 — ABNORMAL LOW
Lymphs Abs: 0.6 — ABNORMAL LOW
Monocytes Absolute: 0.3
Monocytes Relative: 8
Monocytes Relative: 9
Neutrophils Relative %: 86 — ABNORMAL HIGH

## 2011-05-07 LAB — URINE CULTURE

## 2011-05-07 LAB — MONONUCLEOSIS SCREEN: Mono Screen: NEGATIVE

## 2011-12-25 ENCOUNTER — Telehealth: Payer: Self-pay | Admitting: Internal Medicine

## 2011-12-25 ENCOUNTER — Other Ambulatory Visit (INDEPENDENT_AMBULATORY_CARE_PROVIDER_SITE_OTHER): Payer: 59

## 2011-12-25 ENCOUNTER — Encounter: Payer: Self-pay | Admitting: Physician Assistant

## 2011-12-25 ENCOUNTER — Ambulatory Visit (INDEPENDENT_AMBULATORY_CARE_PROVIDER_SITE_OTHER): Payer: 59 | Admitting: Physician Assistant

## 2011-12-25 VITALS — BP 118/64 | HR 80 | Ht 64.0 in | Wt 165.0 lb

## 2011-12-25 DIAGNOSIS — R1011 Right upper quadrant pain: Secondary | ICD-10-CM

## 2011-12-25 DIAGNOSIS — R1013 Epigastric pain: Secondary | ICD-10-CM

## 2011-12-25 DIAGNOSIS — R110 Nausea: Secondary | ICD-10-CM

## 2011-12-25 LAB — CBC WITH DIFFERENTIAL/PLATELET
Basophils Relative: 0.8 % (ref 0.0–3.0)
Eosinophils Absolute: 0.1 10*3/uL (ref 0.0–0.7)
Hemoglobin: 13.2 g/dL (ref 12.0–15.0)
Lymphocytes Relative: 36.7 % (ref 12.0–46.0)
MCHC: 33.5 g/dL (ref 30.0–36.0)
Monocytes Relative: 6.3 % (ref 3.0–12.0)
Neutrophils Relative %: 55 % (ref 43.0–77.0)
RBC: 4.31 Mil/uL (ref 3.87–5.11)
WBC: 4.9 10*3/uL (ref 4.5–10.5)

## 2011-12-25 LAB — HEPATIC FUNCTION PANEL
ALT: 44 U/L — ABNORMAL HIGH (ref 0–35)
Albumin: 4.5 g/dL (ref 3.5–5.2)
Bilirubin, Direct: 0.1 mg/dL (ref 0.0–0.3)
Total Protein: 7.4 g/dL (ref 6.0–8.3)

## 2011-12-25 MED ORDER — ONDANSETRON HCL 8 MG PO TABS: 8.0000 mg | ORAL_TABLET | Freq: Two times a day (BID) | ORAL | Status: AC | PRN

## 2011-12-25 MED ORDER — ESOMEPRAZOLE MAGNESIUM 40 MG PO PACK: 40.0000 mg | PACK | Freq: Every day | ORAL | Status: DC

## 2011-12-25 NOTE — Telephone Encounter (Signed)
I agree with OV.

## 2011-12-25 NOTE — Telephone Encounter (Signed)
Patient calling to report nausea x 1 week that is not relieved by Phenergan. States she had an upper respiratory infection and thought the drainage was causing nausea but that is better and she still has nausea. The Phenergan stops the vomiting but not the constant nausea. She says Dr. Juanda Chance told her to call if she ever had nausea that was not relieved by Phenergan. Scheduled patient with Mike Gip, PA today at 2:30 PM.

## 2011-12-25 NOTE — Patient Instructions (Signed)
We sent a prescription to [facility] [address] for Zofran 8 mg. Take as directed. We gave you samples of Nexium 40 mg, take 1 capsule by mouth 30 min before breakfast. Take the Nexium samples in place of the Pepcid AC.   We did schedule Ultrasound at [facility], Radiology dept 1st floor. Arrive at 10:15 Am. Have nothing to eat or drink after 4:00 AM.

## 2011-12-25 NOTE — Progress Notes (Signed)
Subjective:    Patient ID: Cassie Berry, female    DOB: 12/21/1965, 46 y.o.   MRN: [id number]  HPI Cassie Berry is a pleasant 46 year old white female known to Dr. Lina Sar who is status post cholecystectomy approximately 12 years ago and also has had appendectomy. She was seen in January of 2011 with complaints of recurrent right upper quadrant pain nausea and vomiting which he said felt very similar to her prior gallbladder symptoms. She underwent upper endoscopy per Dr. Ailene Ravel in Dr. Regino Schultze absence and this was a normal exam. She then had MRCP done which was negative as well.  Patient says since that time she has had occasional episodes of nausea which she says Dr. Juanda Chance has attributed to possible spasm of her bile duct and these episodes are relieved by Phenergan fairly quickly. Now over the past 1-1/2-2 weeks she has been having constant problems with nausea. She says her current symptoms started with an upper respiratory infection which was followed by sudden onset of nausea and vomiting. She did get an antibiotic- Zithromax which he took for about 5 days but says that the nausea started before the Zithromax and has been persistent and constant ever since. She says it is very bad nausea and she feels as if she is on the verge of vomiting 24 hours a day. She has been taking Phenergan and says that this does help but when it wears off the nausea comes right back. She says she also cannot take Phenergan on a regular basis because she can't function. She is also noted a dull ache under her right breast into the right upper quadrant and sometimes into the right axillary area. She says this been very mild if she wasn't still nauseated she wouldn't be concerned about this discomfort. She's not had any diarrhea melena or hematochezia no fever or chills. She has not been taking any regular aspirin or NSAIDs. She says she has been using OTC Pepcid about once daily over the past couple of months for intermittent  indigestion type symptoms.  Vision is concerned because she says this is how she felt when she did have her gallbladder problems.    Review of Systems  Constitutional: Positive for appetite change.  HENT: Negative.   Eyes: Negative.   Respiratory: Negative.   Cardiovascular: Negative.   Gastrointestinal: Positive for nausea and abdominal pain.  Genitourinary: Negative.   Musculoskeletal: Negative.   Neurological: Negative.   Hematological: Negative.   Psychiatric/Behavioral: Negative.    Outpatient Encounter Prescriptions as of 12/25/2011  Medication Sig Dispense Refill  . buPROPion (WELLBUTRIN XL) 300 MG 24 hr tablet Take 300 mg by mouth daily.      . cetirizine (ZYRTEC) 10 MG tablet Take 10 mg by mouth as needed.      . cholecalciferol (VITAMIN D) 1000 UNITS tablet Take 1,000 Units by mouth daily.      . Famotidine (PEPCID PO) Take by mouth as needed.      . promethazine (PHENERGAN) 25 MG tablet Take 25 mg by mouth every 6 (six) hours as needed.      . Pyridoxine HCl (VITAMIN B-6 PO) Take 1 tablet by mouth daily.      . vitamin B-12 (CYANOCOBALAMIN) 1000 MCG tablet Take 1,000 mcg by mouth daily.      Marland Kitchen esomeprazole (NEXIUM) 40 MG packet Take 40 mg by mouth daily before breakfast.  24 each  0  . ondansetron (ZOFRAN) 8 MG tablet Take 1 tablet (8 mg total) by  mouth every 12 (twelve) hours as needed for nausea.  40 tablet  0        Allergies  Allergen Reactions  . Macrodantin   . Nitrofurantoin    Patient Active Problem List  Diagnoses  . ABDOMINAL PAIN-RUQ  . ABDOMINAL PAIN, EPIGASTRIC  . Right hip pain  . Greater trochanteric bursitis  . Leg length inequality   History   Social History  . Marital Status: Married    Spouse Name: N/A    Number of Children: N/A  . Years of Education: N/A   Occupational History  . Not on file.   Social History Main Topics  . Smoking status: Former Games developer  . Smokeless tobacco: Never Used  . Alcohol Use: Yes     Occ   . Drug  Use: No  . Sexually Active: Not on file   Other Topics Concern  . Not on file   Social History Narrative   Daily caffeine    Objective:   Physical Exam well-developed white female in no acute distress, pleasant blood pressure 118/64 pulse 80 height 5 foot 4 weight 165. HEENT ;nontraumatic normocephalic EOMI PERRLA sclera anicteric, Supple no JVD, Cardiovascular; regular rate and rhythm with S1-S2 no murmur or gallop, Pulmonary; clear bilaterally, Abdomen; soft she is mildly tender in the epigastrium and right upper quadrant with deep palpation there is no guarding no rebound no palpable mass or hepatosplenomegaly bowel sounds are active, Rectal; exams not done, Extremities; no clubbing cyanosis or edema skin warm dry, Psych; mood and affect normal an appropriate        Assessment & Plan:  #46 year old white female status post remote cholecystectomy with recurrent complaints of constant nausea over the past 1-1/2 weeks associated with vague right upper quadrant and right chest discomfort . Sphincter of OD spasm has been discussed in the past . Her last significant episode was about 2 years ago and at that time an MRCP was negative. Other possibilities could be small retained stone, or gastropathy/peptic ulcer disease. #2 GERD #3 S/ P appendectomy Plan; start Nexium 40 mg by mouth every morning empirically in place of Pepcid patient was given a couple of week's worth of samples. Check CBC hepatic panel and lipase today Schedule for upper abdominal ultrasound. Further plans pending results of above. Will need to discuss possible sphincterotomy with Dr. Juanda Chance.

## 2011-12-26 ENCOUNTER — Ambulatory Visit (HOSPITAL_COMMUNITY)
Admission: RE | Admit: 2011-12-26 | Discharge: 2011-12-26 | Disposition: A | Discharge status: Home / Self Care | Payer: 59 | Source: Ambulatory Visit | Attending: Physician Assistant | Admitting: Physician Assistant

## 2011-12-26 DIAGNOSIS — R1011 Right upper quadrant pain: Secondary | ICD-10-CM | POA: Insufficient documentation

## 2011-12-26 DIAGNOSIS — R1013 Epigastric pain: Secondary | ICD-10-CM | POA: Insufficient documentation

## 2011-12-26 DIAGNOSIS — R110 Nausea: Secondary | ICD-10-CM

## 2011-12-26 DIAGNOSIS — Z9089 Acquired absence of other organs: Secondary | ICD-10-CM | POA: Insufficient documentation

## 2011-12-26 NOTE — Progress Notes (Signed)
Reviewed and agree with management. Romie Keeble D. Erion Hermans, M.D., FACG  

## 2011-12-27 ENCOUNTER — Telehealth: Payer: Self-pay | Admitting: Internal Medicine

## 2011-12-27 MED ORDER — HYOSCYAMINE SULFATE 0.125 MG SL SUBL: SUBLINGUAL_TABLET | SUBLINGUAL | Status: DC

## 2011-12-27 NOTE — Telephone Encounter (Signed)
Spoke with patient and gave her results and recommendations. Rx sent to pharmacy. Appointment scheduled on 01/14/12 at 2:00 PM with Dr. Juanda Chance.

## 2011-12-27 NOTE — Telephone Encounter (Signed)
Patient asking for lab and ultrasound results. Please, advise.

## 2011-12-27 NOTE — Telephone Encounter (Signed)
Abd. Ultrasound shows normal post cholecystectomy anatomy. LFT's are normal except for mild elevation of ALT of questionable significance. I suggest that she breaks her Phenergan in 1/2 to reduce the nausea, also add Levsin SL .125 mg ,#30, 1 q 4-6 hrs prn abd. Pain. She should follow up with me in the office

## 2011-12-31 ENCOUNTER — Other Ambulatory Visit: Payer: Self-pay | Admitting: *Deleted

## 2012-01-02 ENCOUNTER — Encounter: Payer: Self-pay | Admitting: *Deleted

## 2012-01-10 ENCOUNTER — Other Ambulatory Visit (INDEPENDENT_AMBULATORY_CARE_PROVIDER_SITE_OTHER): Payer: 59

## 2012-01-10 DIAGNOSIS — R109 Unspecified abdominal pain: Secondary | ICD-10-CM

## 2012-01-10 LAB — HEPATIC FUNCTION PANEL
ALT: 22 U/L (ref 0–35)
Alkaline Phosphatase: 69 U/L (ref 39–117)
Bilirubin, Direct: 0.1 mg/dL (ref 0.0–0.3)
Total Bilirubin: 0.6 mg/dL (ref 0.3–1.2)

## 2012-01-13 ENCOUNTER — Telehealth: Payer: Self-pay | Admitting: *Deleted

## 2012-01-13 NOTE — Telephone Encounter (Signed)
Message copied by Richardson Chiquito on Mon Jan 13, 2012 12:29 PM ------      Message from: Hart Carwin      Created: Fri Jan 10, 2012 11:53 PM       Please call pt with normal  Liver function tests

## 2012-01-13 NOTE — Telephone Encounter (Signed)
Patient has a follow up appointment with Dr Juanda Chance tomorrow (01/14/12). We will advise her of normal labs at that time.

## 2012-01-14 ENCOUNTER — Ambulatory Visit (INDEPENDENT_AMBULATORY_CARE_PROVIDER_SITE_OTHER): Payer: 59 | Admitting: Internal Medicine

## 2012-01-14 ENCOUNTER — Other Ambulatory Visit (INDEPENDENT_AMBULATORY_CARE_PROVIDER_SITE_OTHER): Payer: 59

## 2012-01-14 ENCOUNTER — Encounter: Payer: Self-pay | Admitting: Internal Medicine

## 2012-01-14 VITALS — BP 110/64 | HR 80 | Ht 64.0 in | Wt 167.0 lb

## 2012-01-14 DIAGNOSIS — R1011 Right upper quadrant pain: Secondary | ICD-10-CM

## 2012-01-14 DIAGNOSIS — R110 Nausea: Secondary | ICD-10-CM

## 2012-01-14 DIAGNOSIS — R7989 Other specified abnormal findings of blood chemistry: Secondary | ICD-10-CM

## 2012-01-14 LAB — HEPATIC FUNCTION PANEL
Albumin: 4.2 g/dL (ref 3.5–5.2)
Alkaline Phosphatase: 72 U/L (ref 39–117)
Total Protein: 7.6 g/dL (ref 6.0–8.3)

## 2012-01-14 NOTE — Patient Instructions (Signed)
You have been scheduled for an endoscopic retrograde cholangiopancreatography (ERCP) with propofol. Please follow written instructions given to you at your visit today. Please go to [facility] Endoscopy (1st floor) at 1 pm on 01/21/12 for your anesthesia pre-op appointment. Your physician has requested that you go to the basement for the following lab work before leaving today: Hepatic Panel CC: Dr [name]

## 2012-01-14 NOTE — Progress Notes (Signed)
Cassie Berry 28-May-1966 MRN [id number]   History of Present Illness:  This is a 45 year old white female with intermittent right upper quadrant abdominal pain radiating to her right shoulder blade suggestive of sphincter of Odi dysfunction. She has mild elevation of transaminases. She has a history of a cholecystectomy in 2001 for cholecystitis and cholelithiasis. She is having episodes of abdominal pain following fatty meals. Pain lasts several hours and then goes away. An upper abdominal ultrasound showed postcholecystectomy common bile duct with no dilatation or stones. Her liver function tests showed mild elevation of ALT. She has been taking Phenergan 12.5 mg when necessary for abdominal pain. She is also on Nexium 40 mg a day.which does not seem to relieve the abd. pain   Past Medical History  Diagnosis Date  . GERD (gastroesophageal reflux disease)   . Migraine headache    Past Surgical History  Procedure Date  . Appendectomy   . Cholecystectomy     reports that she has quit smoking. She has never used smokeless tobacco. She reports that she drinks alcohol. She reports that she does not use illicit drugs. family history includes Breast cancer in her maternal aunt; Colon cancer in her cousin and paternal grandfather; Diabetes in her father; Heart disease in her father; Lung cancer in her mother; and Prostate cancer in an unspecified family member. Allergies  Allergen Reactions  . Macrodantin   . Nitrofurantoin         Review of Systems:  The remainder of the 10 point ROS is negative except as outlined in H&P   Physical Exam: General appearance  Well developed, in no distress. Eyes- non icteric. HEENT nontraumatic, normocephalic. Mouth no lesions, tongue papillated, no cheilosis. Neck supple without adenopathy, thyroid not enlarged, no carotid bruits, no JVD. Lungs Clear to auscultation bilaterally. Cor normal S1, normal S2, regular rhythm, no murmur,  quiet  precordium. Abdomen: Marked tenderness along right costal margin. Normal active bowel sounds. No distention.  Rectal: Not done. Extremities no pedal edema. Skin no lesions. Neurological alert and oriented x 3. Psychological normal mood and affect.  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 She has been on proton pump inhibitors. Her upper endoscopy was normal as well as an MRCP done in 2011. We will proceed with an ERCP and sphincterotomy. We will refill her Phenergan. I have discussed the procedure and sedation with the patient and she understands and wants to proceed.  01/14/2012 Lina Sar

## 2012-01-21 ENCOUNTER — Encounter (HOSPITAL_COMMUNITY): Payer: Self-pay | Admitting: Gastroenterology

## 2012-01-21 ENCOUNTER — Ambulatory Visit (HOSPITAL_COMMUNITY): Payer: 59

## 2012-01-23 ENCOUNTER — Inpatient Hospital Stay (HOSPITAL_COMMUNITY)
Admission: AD | Admit: 2012-01-23 | Discharge: 2012-03-03 | DRG: 438 | Disposition: A | Discharge status: Home Health | Payer: 59 | Source: Ambulatory Visit | Attending: Gastroenterology | Admitting: Gastroenterology

## 2012-01-23 ENCOUNTER — Encounter (HOSPITAL_COMMUNITY): Admission: RE | Payer: Self-pay | Source: Ambulatory Visit

## 2012-01-23 ENCOUNTER — Encounter (HOSPITAL_COMMUNITY)
Admission: AD | Disposition: A | Discharge status: Home Health | Payer: Self-pay | Source: Ambulatory Visit | Attending: Gastroenterology

## 2012-01-23 ENCOUNTER — Encounter (HOSPITAL_COMMUNITY): Payer: Self-pay | Admitting: *Deleted

## 2012-01-23 ENCOUNTER — Encounter: Payer: Self-pay | Admitting: Gastroenterology

## 2012-01-23 ENCOUNTER — Telehealth: Payer: Self-pay | Admitting: *Deleted

## 2012-01-23 ENCOUNTER — Ambulatory Visit (HOSPITAL_COMMUNITY): Payer: 59

## 2012-01-23 ENCOUNTER — Other Ambulatory Visit: Payer: Self-pay | Admitting: *Deleted

## 2012-01-23 ENCOUNTER — Ambulatory Visit (HOSPITAL_COMMUNITY): Admission: RE | Admit: 2012-01-23 | Payer: 59 | Source: Ambulatory Visit | Admitting: Internal Medicine

## 2012-01-23 ENCOUNTER — Ambulatory Visit (HOSPITAL_COMMUNITY): Payer: 59 | Admitting: *Deleted

## 2012-01-23 DIAGNOSIS — R1013 Epigastric pain: Secondary | ICD-10-CM

## 2012-01-23 DIAGNOSIS — K859 Acute pancreatitis without necrosis or infection, unspecified: Principal | ICD-10-CM | POA: Diagnosis present

## 2012-01-23 DIAGNOSIS — R933 Abnormal findings on diagnostic imaging of other parts of digestive tract: Secondary | ICD-10-CM

## 2012-01-23 DIAGNOSIS — R0902 Hypoxemia: Secondary | ICD-10-CM | POA: Diagnosis not present

## 2012-01-23 DIAGNOSIS — R1011 Right upper quadrant pain: Secondary | ICD-10-CM

## 2012-01-23 DIAGNOSIS — K315 Obstruction of duodenum: Secondary | ICD-10-CM | POA: Diagnosis present

## 2012-01-23 DIAGNOSIS — J90 Pleural effusion, not elsewhere classified: Secondary | ICD-10-CM | POA: Diagnosis present

## 2012-01-23 DIAGNOSIS — K862 Cyst of pancreas: Secondary | ICD-10-CM | POA: Diagnosis present

## 2012-01-23 DIAGNOSIS — E46 Unspecified protein-calorie malnutrition: Secondary | ICD-10-CM | POA: Diagnosis present

## 2012-01-23 DIAGNOSIS — E871 Hypo-osmolality and hyponatremia: Secondary | ICD-10-CM | POA: Diagnosis not present

## 2012-01-23 DIAGNOSIS — A419 Sepsis, unspecified organism: Secondary | ICD-10-CM | POA: Diagnosis not present

## 2012-01-23 DIAGNOSIS — R609 Edema, unspecified: Secondary | ICD-10-CM | POA: Diagnosis present

## 2012-01-23 DIAGNOSIS — J9600 Acute respiratory failure, unspecified whether with hypoxia or hypercapnia: Secondary | ICD-10-CM | POA: Diagnosis not present

## 2012-01-23 DIAGNOSIS — F329 Major depressive disorder, single episode, unspecified: Secondary | ICD-10-CM | POA: Diagnosis present

## 2012-01-23 DIAGNOSIS — T189XXA Foreign body of alimentary tract, part unspecified, initial encounter: Secondary | ICD-10-CM

## 2012-01-23 DIAGNOSIS — E876 Hypokalemia: Secondary | ICD-10-CM | POA: Diagnosis not present

## 2012-01-23 DIAGNOSIS — R112 Nausea with vomiting, unspecified: Secondary | ICD-10-CM | POA: Diagnosis present

## 2012-01-23 DIAGNOSIS — R633 Feeding difficulties: Secondary | ICD-10-CM

## 2012-01-23 DIAGNOSIS — R509 Fever, unspecified: Secondary | ICD-10-CM | POA: Diagnosis not present

## 2012-01-23 DIAGNOSIS — R109 Unspecified abdominal pain: Secondary | ICD-10-CM

## 2012-01-23 DIAGNOSIS — I82629 Acute embolism and thrombosis of deep veins of unspecified upper extremity: Secondary | ICD-10-CM | POA: Diagnosis not present

## 2012-01-23 DIAGNOSIS — D649 Anemia, unspecified: Secondary | ICD-10-CM | POA: Diagnosis present

## 2012-01-23 DIAGNOSIS — F3289 Other specified depressive episodes: Secondary | ICD-10-CM | POA: Diagnosis present

## 2012-01-23 DIAGNOSIS — I82621 Acute embolism and thrombosis of deep veins of right upper extremity: Secondary | ICD-10-CM

## 2012-01-23 HISTORY — DX: Pneumonia, unspecified organism: J18.9

## 2012-01-23 HISTORY — DX: Other specified postprocedural states: Z98.890

## 2012-01-23 HISTORY — DX: Other specified postprocedural states: R11.2

## 2012-01-23 HISTORY — DX: Anxiety disorder, unspecified: F41.9

## 2012-01-23 HISTORY — PX: ERCP: SHX5425

## 2012-01-23 HISTORY — PX: SPHINCTEROTOMY: SHX5544

## 2012-01-23 LAB — CBC
HCT: 35.3 % — ABNORMAL LOW (ref 36.0–46.0)
Hemoglobin: 12.2 g/dL (ref 12.0–15.0)
MCH: 30.8 pg (ref 26.0–34.0)
MCHC: 34.6 g/dL (ref 30.0–36.0)
MCV: 89.1 fL (ref 78.0–100.0)
RDW: 12.5 % (ref 11.5–15.5)

## 2012-01-23 LAB — COMPREHENSIVE METABOLIC PANEL
ALT: 166 U/L — ABNORMAL HIGH (ref 0–35)
AST: 267 U/L — ABNORMAL HIGH (ref 0–37)
Albumin: 3.8 g/dL (ref 3.5–5.2)
Alkaline Phosphatase: 96 U/L (ref 39–117)
GFR calc Af Amer: >90 mL/min (ref >90)
Glucose, Bld: 184 mg/dL — ABNORMAL HIGH (ref 70–99)
Potassium: 3.5 mEq/L (ref 3.5–5.1)
Sodium: 137 mEq/L (ref 135–145)
Total Protein: 6.3 g/dL (ref 6.0–8.3)

## 2012-01-23 LAB — LIPASE, BLOOD: Lipase: 237 U/L — ABNORMAL HIGH (ref 11–59)

## 2012-01-23 SURGERY — ERCP, WITH INTERVENTION IF INDICATED
Anesthesia: Moderate Sedation

## 2012-01-23 SURGERY — ERCP, WITH INTERVENTION IF INDICATED
Anesthesia: Monitor Anesthesia Care

## 2012-01-23 MED ORDER — ONDANSETRON HCL 4 MG/2ML IJ SOLN
INTRAMUSCULAR | Status: DC | PRN
  Administered 2012-01-23: 4 mg via INTRAVENOUS

## 2012-01-23 MED ORDER — SUCCINYLCHOLINE CHLORIDE 20 MG/ML IJ SOLN
INTRAMUSCULAR | Status: DC | PRN
  Administered 2012-01-23: 100 mg via INTRAVENOUS

## 2012-01-23 MED ORDER — MEPERIDINE HCL 100 MG/ML IJ SOLN: 6.2500 mg | INTRAMUSCULAR | Status: DC | PRN

## 2012-01-23 MED ORDER — PANTOPRAZOLE SODIUM 40 MG PO TBEC
40.0000 mg | DELAYED_RELEASE_TABLET | Freq: Every day | ORAL | Status: DC
  Filled 2012-01-23: qty 1

## 2012-01-23 MED ORDER — PROMETHAZINE HCL 25 MG/ML IJ SOLN
6.2500 mg | INTRAMUSCULAR | Status: AC | PRN
  Administered 2012-01-23: 12.5 mg via INTRAVENOUS
  Administered 2012-01-24: 6.25 mg via INTRAVENOUS
  Filled 2012-01-23 (×2): qty 1

## 2012-01-23 MED ORDER — ONDANSETRON HCL 4 MG/2ML IJ SOLN: 4.0000 mg | Freq: Four times a day (QID) | INTRAMUSCULAR | Status: DC | PRN

## 2012-01-23 MED ORDER — ONDANSETRON HCL 4 MG/2ML IJ SOLN
4.0000 mg | Freq: Four times a day (QID) | INTRAMUSCULAR | Status: DC | PRN
  Administered 2012-01-24 – 2012-01-30 (×9): 4 mg via INTRAVENOUS
  Filled 2012-01-23 (×12): qty 2

## 2012-01-23 MED ORDER — INDOMETHACIN 50 MG RE SUPP
50.0000 mg | Freq: Once | RECTAL | Status: AC
  Administered 2012-01-23: 50 mg via RECTAL
  Filled 2012-01-23: qty 1

## 2012-01-23 MED ORDER — GLUCAGON HCL (RDNA) 1 MG IJ SOLR
INTRAMUSCULAR | Status: DC | PRN
  Administered 2012-01-23: 1 mg via INTRAVENOUS

## 2012-01-23 MED ORDER — LACTATED RINGERS IV SOLN
INTRAVENOUS | Status: DC | PRN
  Administered 2012-01-23 (×2): via INTRAVENOUS

## 2012-01-23 MED ORDER — GLUCAGON HCL (RDNA) 1 MG IJ SOLR
INTRAMUSCULAR | Status: AC
  Filled 2012-01-23: qty 2

## 2012-01-23 MED ORDER — LIDOCAINE HCL (CARDIAC) 20 MG/ML IV SOLN
INTRAVENOUS | Status: DC | PRN
  Administered 2012-01-23: 50 mg via INTRAVENOUS

## 2012-01-23 MED ORDER — DEXAMETHASONE SODIUM PHOSPHATE 10 MG/ML IJ SOLN
INTRAMUSCULAR | Status: DC | PRN
  Administered 2012-01-23: 10 mg via INTRAVENOUS

## 2012-01-23 MED ORDER — SODIUM CHLORIDE 0.9 % IJ SOLN: 3.0000 mL | INTRAMUSCULAR | Status: DC | PRN

## 2012-01-23 MED ORDER — MIDAZOLAM HCL 5 MG/5ML IJ SOLN
INTRAMUSCULAR | Status: DC | PRN
  Administered 2012-01-23: 2 mg via INTRAVENOUS

## 2012-01-23 MED ORDER — FENTANYL CITRATE 0.05 MG/ML IJ SOLN
INTRAMUSCULAR | Status: AC
  Filled 2012-01-23: qty 2

## 2012-01-23 MED ORDER — PROPOFOL 10 MG/ML IV BOLUS
INTRAVENOUS | Status: DC | PRN
  Administered 2012-01-23: 140 mg via INTRAVENOUS

## 2012-01-23 MED ORDER — LACTATED RINGERS IV SOLN
INTRAVENOUS | Status: DC
  Administered 2012-01-23: 1000 mL via INTRAVENOUS

## 2012-01-23 MED ORDER — SODIUM CHLORIDE 0.9 % IJ SOLN: 3.0000 mL | Freq: Two times a day (BID) | INTRAMUSCULAR | Status: DC

## 2012-01-23 MED ORDER — SODIUM CHLORIDE 0.9 % IV SOLN
1.5000 g | Freq: Once | INTRAVENOUS | Status: AC
  Administered 2012-01-23: 1.5 g via INTRAVENOUS
  Filled 2012-01-23: qty 1.5

## 2012-01-23 MED ORDER — HYDROMORPHONE HCL PF 1 MG/ML IJ SOLN
1.0000 mg | INTRAMUSCULAR | Status: DC | PRN
  Administered 2012-01-23 – 2012-01-24 (×7): 1 mg via INTRAVENOUS
  Filled 2012-01-23 (×8): qty 1

## 2012-01-23 MED ORDER — ONDANSETRON HCL 4 MG PO TABS
4.0000 mg | ORAL_TABLET | Freq: Four times a day (QID) | ORAL | Status: DC | PRN
  Filled 2012-01-23: qty 1

## 2012-01-23 MED ORDER — INDOMETHACIN 50 MG RE SUPP
100.0000 mg | Freq: Once | RECTAL | Status: AC
  Administered 2012-01-23: 50 mg via RECTAL
  Filled 2012-01-23: qty 2

## 2012-01-23 MED ORDER — HYOSCYAMINE SULFATE 0.125 MG SL SUBL
0.1250 mg | SUBLINGUAL_TABLET | Freq: Four times a day (QID) | SUBLINGUAL | Status: DC | PRN
  Filled 2012-01-23 (×2): qty 1

## 2012-01-23 MED ORDER — FENTANYL CITRATE 0.05 MG/ML IJ SOLN
INTRAMUSCULAR | Status: DC | PRN
  Administered 2012-01-23: 50 ug via INTRAVENOUS
  Administered 2012-01-23: 25 ug via INTRAVENOUS
  Administered 2012-01-23 (×2): 50 ug via INTRAVENOUS
  Administered 2012-01-23: 25 ug via INTRAVENOUS

## 2012-01-23 MED ORDER — SODIUM CHLORIDE 0.9 % IV SOLN
INTRAVENOUS | Status: AC
  Filled 2012-01-23: qty 1.5

## 2012-01-23 MED ORDER — SODIUM CHLORIDE 0.9 % IV SOLN
INTRAVENOUS | Status: DC | PRN
  Administered 2012-01-23: 16:00:00

## 2012-01-23 MED ORDER — ONDANSETRON HCL 4 MG/2ML IJ SOLN
INTRAMUSCULAR | Status: AC
  Filled 2012-01-23: qty 2

## 2012-01-23 MED ORDER — SODIUM CHLORIDE 0.9 % IV SOLN
INTRAVENOUS | Status: AC
Start: 2012-01-23 — End: 2012-01-23
  Administered 2012-01-23: 1000 mL via INTRAVENOUS

## 2012-01-23 MED ORDER — FENTANYL CITRATE 0.05 MG/ML IJ SOLN
50.0000 ug | Freq: Once | INTRAMUSCULAR | Status: AC
  Administered 2012-01-23: 50 ug via INTRAVENOUS

## 2012-01-23 MED ORDER — CIPROFLOXACIN IN D5W 400 MG/200ML IV SOLN
INTRAVENOUS | Status: AC
  Filled 2012-01-23: qty 200

## 2012-01-23 MED ORDER — SODIUM CHLORIDE 0.9 % IV SOLN: 250.0000 mL | INTRAVENOUS | Status: DC | PRN

## 2012-01-23 MED ORDER — LACTATED RINGERS IV SOLN: INTRAVENOUS | Status: DC

## 2012-01-23 MED ORDER — GLYCOPYRROLATE 0.2 MG/ML IJ SOLN
INTRAMUSCULAR | Status: AC
  Filled 2012-01-23: qty 1

## 2012-01-23 NOTE — Progress Notes (Signed)
Pt has c/o N/V.  Notified Dr. Arlyce Dice.  Per Dr. Arlyce Dice give Zofran 4mg  IV x1 and continue to monitor pt until N/V gets better.

## 2012-01-23 NOTE — H&P (View-Only) (Signed)
Cassie Berry 01/10/1966 MRN [id number]   History of Present Illness:  This is a 46-year-old white female with intermittent right upper quadrant abdominal pain radiating to her right shoulder blade suggestive of sphincter of Odi dysfunction. She has mild elevation of transaminases. She has a history of a cholecystectomy in 2001 for cholecystitis and cholelithiasis. She is having episodes of abdominal pain following fatty meals. Pain lasts several hours and then goes away. An upper abdominal ultrasound showed postcholecystectomy common bile duct with no dilatation or stones. Her liver function tests showed mild elevation of ALT. She has been taking Phenergan 12.5 mg when necessary for abdominal pain. She is also on Nexium 40 mg a day.which does not seem to relieve the abd. pain   Past Medical History  Diagnosis Date  . GERD (gastroesophageal reflux disease)   . Migraine headache    Past Surgical History  Procedure Date  . Appendectomy   . Cholecystectomy     reports that she has quit smoking. She has never used smokeless tobacco. She reports that she drinks alcohol. She reports that she does not use illicit drugs. family history includes Breast cancer in her maternal aunt; Colon cancer in her cousin and paternal grandfather; Diabetes in her father; Heart disease in her father; Lung cancer in her mother; and Prostate cancer in an unspecified family member. Allergies  Allergen Reactions  . Macrodantin   . Nitrofurantoin         Review of Systems:  The remainder of the 10 point ROS is negative except as outlined in H&P   Physical Exam: General appearance  Well developed, in no distress. Eyes- non icteric. HEENT nontraumatic, normocephalic. Mouth no lesions, tongue papillated, no cheilosis. Neck supple without adenopathy, thyroid not enlarged, no carotid bruits, no JVD. Lungs Clear to auscultation bilaterally. Cor normal S1, normal S2, regular rhythm, no murmur,  quiet  precordium. Abdomen: Marked tenderness along right costal margin. Normal active bowel sounds. No distention.  Rectal: Not done. Extremities no pedal edema. Skin no lesions. Neurological alert and oriented x 3. Psychological normal mood and affect.  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 She has been on proton pump inhibitors. Her upper endoscopy was normal as well as an MRCP done in 2011. We will proceed with an ERCP and sphincterotomy. We will refill her Phenergan. I have discussed the procedure and sedation with the patient and she understands and wants to proceed.  01/14/2012 Quint Chestnut  

## 2012-01-23 NOTE — H&P (Signed)
Primary Care Physician:  Londell Moh, MD Primary Gastroenterologist:  Lina Sar, MD  **H&P done by Dr. Arlyce Dice. His progress note done today at 5:51pm is pasted below.   HPI: Cassie Berry is a 46 y.o. female followed by  Dr. Juanda Chance for persistent right upper quadrant pain with only minor elevation of liver tests. Prior workup including ultrasound was negative. She is status post cholecystectomy. Today she underwent ERCP with sphincterotomy and insertion of a pancreatic stent. She initially awakened feeling well but has since developed nausea and moderate midepigastric pain. She received Indocin 100 mg suppository pre-procedure.    Past Medical History  Diagnosis Date  . GERD (gastroesophageal reflux disease)   . Anxiety   . Migraine headache   . PONV (postoperative nausea and vomiting)   . Pneumonia     02/2011    Past Surgical History  Procedure Date  . Appendectomy   . Cholecystectomy     Prior to Admission medications   Medication Sig Start Date End Date Taking? Authorizing Provider  buPROPion (WELLBUTRIN XL) 300 MG 24 hr tablet Take 300 mg by mouth daily.   Yes Historical Provider, MD  cetirizine (ZYRTEC) 10 MG tablet Take 10 mg by mouth as needed.   Yes Historical Provider, MD  cholecalciferol (VITAMIN D) 1000 UNITS tablet Take 1,000 Units by mouth daily.   Yes Historical Provider, MD  esomeprazole (NEXIUM) 40 MG packet Take 40 mg by mouth daily before breakfast. 12/25/11 12/24/12 Yes Amy S Esterwood, PA  Ondansetron HCl (ZOFRAN PO) Take by mouth as needed.   Yes Historical Provider, MD  promethazine (PHENERGAN) 25 MG tablet Take 25 mg by mouth every 6 (six) hours as needed.   Yes Historical Provider, MD  Pyridoxine HCl (VITAMIN B-6 PO) Take 1 tablet by mouth daily.   Yes Historical Provider, MD  vitamin B-12 (CYANOCOBALAMIN) 1000 MCG tablet Take 1,000 mcg by mouth daily.   Yes Historical Provider, MD  hyoscyamine (LEVSIN/SL) 0.125 MG SL tablet Place one tablet under  the tongue every 4-6 hours prn abdominal pain 12/27/11   Hart Carwin, MD    Current Facility-Administered Medications  Medication Dose Route Frequency Provider Last Rate Last Dose  . 0.9 %  sodium chloride infusion   Intravenous Continuous Louis Meckel, MD 250 mL/hr at 01/23/12 1753 1,000 mL at 01/23/12 1753  . ampicillin-sulbactam (UNASYN) 1.5 g in sodium chloride 0.9 % 50 mL IVPB  1.5 g Intravenous Once Hart Carwin, MD   1.5 g at 01/23/12 1411  . fentaNYL (SUBLIMAZE) injection 50 mcg  50 mcg Intravenous Once Louis Meckel, MD   50 mcg at 01/23/12 1757  . indomethacin (INDOCIN) 50 MG suppository 100 mg  100 mg Rectal Once Louis Meckel, MD   50 mg at 01/23/12 1602  . indomethacin (INDOCIN) 50 MG suppository 50 mg  50 mg Rectal Once Louis Meckel, MD   50 mg at 01/23/12 1806  . ondansetron (ZOFRAN) injection 4 mg  4 mg Intravenous Q6H PRN Meredith Pel, NP      . promethazine (PHENERGAN) injection 6.25-12.5 mg  6.25-12.5 mg Intravenous Q15 min PRN Phillips Grout, MD      . DISCONTD: 0.9 %  sodium chloride infusion  250 mL Intravenous PRN Meredith Pel, NP      . DISCONTD: lactated ringers infusion   Intravenous Continuous Hart Carwin, MD 20 mL/hr at 01/23/12 1300 1,000 mL at 01/23/12 1300  . DISCONTD: lactated ringers infusion  Intravenous Continuous Phillips Grout, MD      . DISCONTD: meperidine (DEMEROL) injection 6.25-12.5 mg  6.25-12.5 mg Intravenous Q5 min PRN Phillips Grout, MD      . DISCONTD: Omnipaque 350 mg/mL (50 mL) in 0.9% normal saline (50 mL)    PRN Louis Meckel, MD      . DISCONTD: ondansetron [facility]) injection 4 mg  4 mg Intravenous Q6H PRN Meredith Pel, NP      . DISCONTD: ondansetron (ZOFRAN) tablet 4 mg  4 mg Oral Q6H PRN Meredith Pel, NP      . DISCONTD: sodium chloride 0.9 % injection 3 mL  3 mL Intravenous Q12H Meredith Pel, NP      . DISCONTD: sodium chloride 0.9 % injection 3 mL  3 mL Intravenous PRN Meredith Pel, NP        Facility-Administered Medications Ordered in Other Encounters  Medication Dose Route Frequency Provider Last Rate Last Dose  . DISCONTD: dexamethasone (DECADRON) injection    PRN Doran Clay, CRNA   10 mg at 01/23/12 1453  . DISCONTD: fentaNYL (SUBLIMAZE) injection    PRN Lattie Haw, CRNA   25 mcg at 01/23/12 1448  . DISCONTD: glucagon (GLUCAGEN) injection    PRN Doran Clay, CRNA   1 mg at 01/23/12 1545  . DISCONTD: lactated ringers infusion    Continuous PRN Lattie Haw, CRNA      . DISCONTD: lidocaine (cardiac) 100 mg/63ml (XYLOCAINE) 20 MG/ML injection 2%    PRN Lattie Haw, CRNA   50 mg at 01/23/12 1407  . DISCONTD: midazolam (VERSED) 5 MG/5ML injection    PRN Lattie Haw, CRNA   2 mg at 01/23/12 1405  . DISCONTD: ondansetron (ZOFRAN) injection    PRN Doran Clay, CRNA   4 mg at 01/23/12 1445  . DISCONTD: propofol (DIPRIVAN) 10 mg/mL bolus/IV push    PRN Lattie Haw, CRNA   140 mg at 01/23/12 1407  . DISCONTD: succinylcholine (ANECTINE) injection    PRN Lattie Haw, CRNA   100 mg at 01/23/12 1408    Allergies as of 01/15/2012 - Review Complete 01/14/2012  Allergen Reaction Noted  . Macrodantin  11/28/2010  . Nitrofurantoin      Family History  Problem Relation Age of Onset  . Colon cancer Cousin     Paternal side  . Colon cancer Paternal Grandfather   . Lung cancer Mother   . Diabetes Father   . Heart disease Father   . Prostate cancer      grandfather  . Breast cancer Maternal Aunt     History   Social History  . Marital Status: Married    Spouse Name: N/A    Number of Children: N/A  . Years of Education: N/A   Occupational History  . Not on file.   Social History Main Topics  . Smoking status: Former Games developer  . Smokeless tobacco: Never Used  . Alcohol Use: Yes     Occasional  . Drug Use: No  . Sexually Active: Not on file    Social History  Narrative   Daily caffeine    Review of Systems: Pertinent positive and negative review of systems were noted in the above HPI section. All other review of systems were otherwise negative  Physical Exam: Vital signs in last 24 hours: Temp:  [97.4 F (36.3 C)-98.2 F (36.8 C)] 97.4 F (36.3 C) (  06/13 1625) Resp:  [12-34] 12  (06/13 1730) BP: (112-136)/(70-89) 125/80 mmHg (06/13 1730) SpO2:  [95 %-100 %] 97 % (06/13 1730)  Vital signs were reviewed in today's medical record  Physical Exam:  General: Well developed , well nourished, slightly ill-appearing  Head: Normocephalic and atraumatic  Eyes: sclerae anicteric, EOMI  Ears: Normal auditory acuity  Mouth: No deformity or lesions  Neck: Supple, no masses or thyromegaly  Lungs: Clear throughout to auscultation  Heart: Regular rate and rhythm; no murmurs, rubs or bruits  Abdomen: Soft,and non distended. No masses, hepatosplenomegaly or hernias noted. Normal Bowel sounds; there is mild tenderness in the midepigastrium  Rectal:deferred  Musculoskeletal: Symmetrical with no gross deformities  Skin: No lesions on visible extremities  Pulses: Normal pulses noted  Extremities: No clubbing, cyanosis, edema or deformities noted  Neurological: Alert oriented x 4, grossly nonfocal  Cervical Nodes: No significant cervical adenopathy  Inguinal Nodes: No significant inguinal adenopathy  Psychological: Alert and cooperative. Normal mood and affect   Lab Results: Studies/Results: Dg Ercp With Sphincterotomy  01/23/2012  *RADIOLOGY REPORT*  Clinical Data: Choledocholithiasis, epigastric pain  ERCP with sphincterotomy  Comparison:  12/26/2011 ultrasound, 09/04/2009 MRCP  Technique:  Multiple spot images obtained with the fluoroscopic device and submitted for interpretation post-procedure.  ERCP was performed by Dr. Arlyce Dice.  Findings:  four spot fluoroscopic images obtained during ERCP. Initial image demonstrates a radiopaque biliary stent.  Prior  cholecystectomy evident.  Common bile duct cannulation was performed for a sphincterotomy and cholangiogram.  Final image demonstrates patent biliary system without significant dilatation or obstruction following balloon sweep and sphincterotomy.  IMPRESSION: Biliary system appears patent after sphincterotomy and balloon sweep.  These images were submitted for radiologic interpretation only. Please see the procedural report for the amount of contrast and the fluoroscopy time utilized.  Original Report Authenticated By: Judie Petit. Ruel Favors, M.D.    Impression / Plan:  1. Abdominal pain post ERCP. This likely represents mild post ERCP pancreatitis. Will admit for observation and supportive care including vigorous IV fluids, analgesics and anti-emetics. Will obtain labs including CBC, amylase, lipase and CMP.   2. GERD, continue PPI  3. Anxiety / ? Depression based on fact she is on Wellbutrin. She had dose today. Will hold for now.    LOS: 0 days   [name]  01/23/2012, 6:18 PM

## 2012-01-23 NOTE — Discharge Instructions (Signed)
My office will schedule a abdominal film on June 17 to see if pancreatic stent is still in place.  If so Dr. Juanda Chance will remove it endoscopically on Mon or Tuesday.

## 2012-01-23 NOTE — Anesthesia Postprocedure Evaluation (Signed)
  Anesthesia Post-op Note  Patient: Cassie Berry  Procedure(s) Performed: Procedure(s) (LRB): ENDOSCOPIC RETROGRADE CHOLANGIOPANCREATOGRAPHY (ERCP) (N/A) SPHINCTEROTOMY (N/A)  Patient Location: PACU  Anesthesia Type: General  Level of Consciousness: awake and alert   Airway and Oxygen Therapy: Patient Spontanous Breathing  Post-op Pain: mild  Post-op Assessment: Post-op Vital signs reviewed, Patient's Cardiovascular Status Stable, Respiratory Function Stable, Patent Airway and No signs of Nausea or vomiting  Post-op Vital Signs: stable  Complications: No apparent anesthesia complications

## 2012-01-23 NOTE — Progress Notes (Addendum)
Patient ID: Cassie Berry, female   DOB: 1966-06-23, 46 y.o.   MRN: [id number] History of Present Illness: Ms. Zmuda has had persistent right upper quadrant pain with only minor elevation of liver tests. Prior workup including ultrasound was negative. She is status post cholecystectomy. Today she underwent ERCP with sphincterotomy and insertion of a pancreatic stent. She initially awakened feeling well but has since developed nausea and moderate midepigastric pain. She received indomethacin 100 mg suppository following the procedure.    Past Medical History  Diagnosis Date  . GERD (gastroesophageal reflux disease)   . Anxiety   . Migraine headache   . PONV (postoperative nausea and vomiting)   . Pneumonia     02/2011   Past Surgical History  Procedure Date  . Appendectomy   . Cholecystectomy    family history includes Breast cancer in her maternal aunt; Colon cancer in her cousin and paternal grandfather; Diabetes in her father; Heart disease in her father; Lung cancer in her mother; and Prostate cancer in an unspecified family member. No current facility-administered medications for this visit.   No current outpatient prescriptions on file.   Facility-Administered Medications Ordered in Other Visits  Medication Dose Route Frequency Provider Last Rate Last Dose  . 0.9 %  sodium chloride infusion   Intravenous Continuous Louis Meckel, MD      . ampicillin-sulbactam (UNASYN) 1.5 g in sodium chloride 0.9 % 50 mL IVPB  1.5 g Intravenous Once Hart Carwin, MD   1.5 g at 01/23/12 1411  . fentaNYL (SUBLIMAZE) injection 50 mcg  50 mcg Intravenous Once Louis Meckel, MD      . indomethacin (INDOCIN) 50 MG suppository 100 mg  100 mg Rectal Once Louis Meckel, MD   50 mg at 01/23/12 1602  . lactated ringers infusion   Intravenous Continuous Hart Carwin, MD 20 mL/hr at 01/23/12 1300 1,000 mL at 01/23/12 1300  . lactated ringers infusion   Intravenous Continuous Phillips Grout, MD      .  meperidine (DEMEROL) injection 6.25-12.5 mg  6.25-12.5 mg Intravenous Q5 min PRN Phillips Grout, MD      . promethazine (PHENERGAN) injection 6.25-12.5 mg  6.25-12.5 mg Intravenous Q15 min PRN Phillips Grout, MD      . DISCONTD: dexamethasone (DECADRON) injection    PRN Doran Clay, CRNA   10 mg at 01/23/12 1453  . DISCONTD: fentaNYL (SUBLIMAZE) injection    PRN Lattie Haw, CRNA   25 mcg at 01/23/12 1448  . DISCONTD: glucagon (GLUCAGEN) injection    PRN Doran Clay, CRNA   1 mg at 01/23/12 1545  . DISCONTD: lactated ringers infusion    Continuous PRN Lattie Haw, CRNA      . DISCONTD: lidocaine (cardiac) 100 mg/35ml (XYLOCAINE) 20 MG/ML injection 2%    PRN Lattie Haw, CRNA   50 mg at 01/23/12 1407  . DISCONTD: midazolam (VERSED) 5 MG/5ML injection    PRN Lattie Haw, CRNA   2 mg at 01/23/12 1405  . DISCONTD: Omnipaque 350 mg/mL (50 mL) in 0.9% normal saline (50 mL)    PRN Louis Meckel, MD      . DISCONTD: ondansetron [facility]) injection    PRN Doran Clay, CRNA   4 mg at 01/23/12 1445  . DISCONTD: propofol (DIPRIVAN) 10 mg/mL bolus/IV push    PRN Lattie Haw, CRNA   140 mg at 01/23/12 1407  .  DISCONTD: succinylcholine (ANECTINE) injection    PRN Lattie Haw, CRNA   100 mg at 01/23/12 1408   Allergies as of 01/23/2012 - Review Complete 01/23/2012  Allergen Reaction Noted  . Macrodantin Rash 11/28/2010  . Nitrofurantoin      reports that she has quit smoking. She has never used smokeless tobacco. She reports that she drinks alcohol. She reports that she does not use illicit drugs.     Review of Systems: Pertinent positive and negative review of systems were noted in the above HPI section. All other review of systems were otherwise negative.  Vital signs were reviewed in today's medical record Physical Exam: General: Well developed , well nourished, slightly ill-appearing    Head: Normocephalic and atraumatic Eyes:  sclerae anicteric, EOMI Ears: Normal auditory acuity Mouth: No deformity or lesions Neck: Supple, no masses or thyromegaly Lungs: Clear throughout to auscultation Heart: Regular rate and rhythm; no murmurs, rubs or bruits Abdomen: Soft,and non distended. No masses, hepatosplenomegaly or hernias noted. Normal Bowel sounds; there is mild tenderness in the midepigastrium  Rectal:deferred Musculoskeletal: Symmetrical with no gross deformities  Skin: No lesions on visible extremities Pulses:  Normal pulses noted Extremities: No clubbing, cyanosis, edema or deformities noted Neurological: Alert oriented x 4, grossly nonfocal Cervical Nodes:  No significant cervical adenopathy Inguinal Nodes: No significant inguinal adenopathy Psychological:  Alert and cooperative. Normal mood and affect  Impression-abdominal pain post ERCP. This likely represents mild post ERCP pancreatitis  Recommendations #1 admit for her enteral analgesics and observation #2 check amylase lipase, LFTs and CBC #3 vigorous IV hydration

## 2012-01-23 NOTE — Anesthesia Preprocedure Evaluation (Addendum)
Anesthesia Evaluation  Patient identified by MRN, date of birth, ID band Patient awake    Reviewed: Allergy & Precautions, H&P , NPO status , Patient's Chart, lab work & pertinent test results  History of Anesthesia Complications (+) PONV  Airway Mallampati: II TM Distance: >3 FB Neck ROM: Full    Dental No notable dental hx.    Pulmonary neg pulmonary ROS,  breath sounds clear to auscultation  Pulmonary exam normal       Cardiovascular negative cardio ROS  Rhythm:Regular Rate:Normal     Neuro/Psych negative neurological ROS  negative psych ROS   GI/Hepatic negative GI ROS, Neg liver ROS,   Endo/Other  negative endocrine ROS  Renal/GU negative Renal ROS  negative genitourinary   Musculoskeletal negative musculoskeletal ROS (+)   Abdominal   Peds negative pediatric ROS (+)  Hematology negative hematology ROS (+)   Anesthesia Other Findings   Reproductive/Obstetrics negative OB ROS                           Anesthesia Physical Anesthesia Plan  ASA: II  Anesthesia Plan: General   Post-op Pain Management:    Induction: Intravenous  Airway Management Planned: Oral ETT  Additional Equipment:   Intra-op Plan:   Post-operative Plan: Extubation in OR  Informed Consent: I have reviewed the patients History and Physical, chart, labs and discussed the procedure including the risks, benefits and alternatives for the proposed anesthesia with the patient or authorized representative who has indicated his/her understanding and acceptance.   Dental advisory given  Plan Discussed with: CRNA  Anesthesia Plan Comments:         Anesthesia Quick Evaluation  

## 2012-01-23 NOTE — Op Note (Signed)
[facility] [address] [place], [place]  [address]  ERCP PROCEDURE REPORT  PATIENT:  Cassie Berry, Cassie Berry  MR#:  [id number] BIRTHDATE:  Jan 16, 1966  GENDER:  female  ENDOSCOPIST:  Barbette Hair. Arlyce Dice, MD ASSISTANT:  Olene Craven, Felecia Shelling, RN, Claudie Revering, RN CGRN  PROCEDURE DATE:  01/23/2012 PROCEDURE:  ERCP with sphincterotomy, ERCP with stent placement ASA CLASS:  Class II  INDICATIONS:  Sphincter of Oddi dysfunction  MEDICATIONS:   MAC sedation, administered by CRNA, antiobiotics Unasyn 1.5gmIV  Indomethasone 100mg  suppository at conclusion of procedure.  TOPICAL ANESTHETIC:  DESCRIPTION OF PROCEDURE:   After the risks benefits and alternatives of the procedure were thoroughly explained, informed consent was obtained.  The Pentax ERCP [id number] endoscope was introduced through the mouth and advanced to the second portion of the duodenum.  The pancreatic duct was filled to the tail and appeared to be normal. Care was taken not to overfill the ductal system. Cannulation of the common bile duct was accomplished. The common bile duct and intrahepatics were normal without filling defects, strictures, or stones. The pancreatic duct was cannulated multiple times with a 0.69mm guidewire. A 5Fr 3cm pigtail catheter was inserted temporarily into the duct to facilitate cannulation of the CBD. This was subsequently pulled and a 4Fr 5cm pancreatic stent was placed. On one injection of the pancreatic duct there was some extravasation of contrast dye. A 15mm sphincterotomy cut was made and the CBD was swept with a 12mm balloon stone extractor. No stones were seen in the duodenum (see image1 and image2).    The scope was then completely withdrawn from the patient and the procedure terminated. 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 Juanda Chance  ______________________________ Barbette Hair. Arlyce Dice, MD  CC:  n. eSIGNED:   Barbette Hair. Twylah Bennetts at 01/23/2012 04:41 PM  Venita Lick, [phone]

## 2012-01-23 NOTE — Interval H&P Note (Signed)
History and Physical Interval Note:  01/23/2012 2:15 PM  Cassie Berry  has presented today for surgery, with the diagnosis of ABD Pain  The various methods of treatment have been discussed with the patient and family. After consideration of risks, benefits and other options for treatment, the patient has consented to  Procedure(s) (LRB): ENDOSCOPIC RETROGRADE CHOLANGIOPANCREATOGRAPHY (ERCP) (N/A) SPHINCTEROTOMY (N/A) as a surgical intervention .  The patients' history has been reviewed, patient examined, no change in status, stable for surgery.  I have reviewed the patients' chart and labs.  Questions were answered to the patient's satisfaction.    The recent H&P (dated *01/14/12**) was reviewed, the patient was examined and there is no change in the patients condition since that H&P was completed.   [name]  01/23/2012, 2:15 PM    [name]

## 2012-01-23 NOTE — Telephone Encounter (Signed)
Per Dr. Arlyce Dice, have patient be NPO on Tuesday. Get KUB stat in AM. If pancreatic stint present, patient to have EGD to remove stint in [facility] by Dr. Juanda Chance. Order in [facility] for KUB stat with call report.

## 2012-01-23 NOTE — Transfer of Care (Signed)
Immediate Anesthesia Transfer of Care Note  Patient: Cassie Berry  Procedure(s) Performed: Procedure(s) (LRB): ENDOSCOPIC RETROGRADE CHOLANGIOPANCREATOGRAPHY (ERCP) (N/A) SPHINCTEROTOMY (N/A)  Patient Location: PACU  Anesthesia Type: General  Level of Consciousness: sedated  Airway & Oxygen Therapy: Patient Spontanous Breathing and Patient connected to face mask oxygen  Post-op Assessment: Report given to PACU RN and Post -op Vital signs reviewed and stable  Post vital signs: Reviewed and stable  Complications: No apparent anesthesia complications

## 2012-01-24 ENCOUNTER — Encounter (HOSPITAL_COMMUNITY): Payer: Self-pay | Admitting: Gastroenterology

## 2012-01-24 LAB — HEPATIC FUNCTION PANEL
ALT: 405 U/L — ABNORMAL HIGH (ref 0–35)
AST: 466 U/L — ABNORMAL HIGH (ref 0–37)
Indirect Bilirubin: 0.4 mg/dL (ref 0.3–0.9)
Total Protein: 6.5 g/dL (ref 6.0–8.3)

## 2012-01-24 LAB — BASIC METABOLIC PANEL
CO2: 25 mEq/L (ref 19–32)
Chloride: 106 mEq/L (ref 96–112)
Creatinine, Ser: 0.52 mg/dL (ref 0.50–1.10)

## 2012-01-24 LAB — CBC
HCT: 35.3 % — ABNORMAL LOW (ref 36.0–46.0)
MCV: 88.7 fL (ref 78.0–100.0)
RDW: 12.4 % (ref 11.5–15.5)
WBC: 11.2 10*3/uL — ABNORMAL HIGH (ref 4.0–10.5)

## 2012-01-24 MED ORDER — SODIUM CHLORIDE 0.9 % IJ SOLN: 9.0000 mL | INTRAMUSCULAR | Status: DC | PRN

## 2012-01-24 MED ORDER — ONDANSETRON HCL 4 MG/2ML IJ SOLN
4.0000 mg | Freq: Four times a day (QID) | INTRAMUSCULAR | Status: DC | PRN
  Administered 2012-01-27 – 2012-02-08 (×13): 4 mg via INTRAVENOUS
  Filled 2012-01-24 (×17): qty 2

## 2012-01-24 MED ORDER — BISACODYL 10 MG RE SUPP
10.0000 mg | Freq: Once | RECTAL | Status: AC
  Administered 2012-01-24: 10 mg via RECTAL
  Filled 2012-01-24: qty 1

## 2012-01-24 MED ORDER — PANTOPRAZOLE SODIUM 40 MG IV SOLR
40.0000 mg | Freq: Every day | INTRAVENOUS | Status: DC
  Administered 2012-01-24 – 2012-02-13 (×21): 40 mg via INTRAVENOUS
  Filled 2012-01-24 (×22): qty 40

## 2012-01-24 MED ORDER — HYDROMORPHONE 0.3 MG/ML IV SOLN
INTRAVENOUS | Status: DC
  Administered 2012-01-24: 22:00:00 via INTRAVENOUS
  Administered 2012-01-25: 1.39 mg via INTRAVENOUS
  Administered 2012-01-25: 0.27 mg via INTRAVENOUS
  Administered 2012-01-25: 0.9 mg via INTRAVENOUS
  Administered 2012-01-25: 1.2 mg via INTRAVENOUS
  Administered 2012-01-25: 0.9 mg via INTRAVENOUS
  Administered 2012-01-25: 16:00:00 via INTRAVENOUS
  Administered 2012-01-26: 1.59 mg via INTRAVENOUS
  Administered 2012-01-26: 09:00:00 via INTRAVENOUS
  Administered 2012-01-26 (×2): 1.8 mg via INTRAVENOUS
  Administered 2012-01-27: 2.7 mg via INTRAVENOUS
  Administered 2012-01-27: 20:00:00 via INTRAVENOUS
  Administered 2012-01-27: 0.3 mg via INTRAVENOUS
  Administered 2012-01-27: 07:00:00 via INTRAVENOUS
  Administered 2012-01-27: 1.2 mg via INTRAVENOUS
  Administered 2012-01-27: 2.1 mg via INTRAVENOUS
  Administered 2012-01-27: 2.4 mg via INTRAVENOUS
  Administered 2012-01-28 (×2): via INTRAVENOUS
  Administered 2012-01-28: 2.4 mg via INTRAVENOUS
  Administered 2012-01-28: 3 mg via INTRAVENOUS
  Administered 2012-01-28: 0.12 mg via INTRAVENOUS
  Administered 2012-01-28: 1.5 mg via INTRAVENOUS
  Administered 2012-01-28: 3 mg via INTRAVENOUS
  Administered 2012-01-28: 1.74 mg via INTRAVENOUS
  Administered 2012-01-29: 1.2 mg via INTRAVENOUS
  Administered 2012-01-29 (×2): 0.9 mg via INTRAVENOUS
  Administered 2012-01-29: 1.8 mg via INTRAVENOUS
  Administered 2012-01-29: 06:00:00 via INTRAVENOUS
  Administered 2012-01-29: 0.3 mg via INTRAVENOUS
  Administered 2012-01-30: 2.4 mg via INTRAVENOUS
  Administered 2012-01-30: 02:00:00 via INTRAVENOUS
  Administered 2012-01-30: 0.9 mg via INTRAVENOUS
  Filled 2012-01-24 (×8): qty 25

## 2012-01-24 MED ORDER — DIPHENHYDRAMINE HCL 50 MG/ML IJ SOLN
12.5000 mg | Freq: Four times a day (QID) | INTRAMUSCULAR | Status: DC | PRN
  Filled 2012-01-24: qty 1

## 2012-01-24 MED ORDER — SODIUM CHLORIDE 0.9 % IV SOLN
INTRAVENOUS | Status: DC
  Administered 2012-01-24 – 2012-01-25 (×4): via INTRAVENOUS
  Administered 2012-01-25: 200 mL/h via INTRAVENOUS
  Administered 2012-01-26: 04:00:00 via INTRAVENOUS

## 2012-01-24 MED ORDER — DIPHENHYDRAMINE HCL 12.5 MG/5ML PO ELIX: 12.5000 mg | ORAL_SOLUTION | Freq: Four times a day (QID) | ORAL | Status: DC | PRN

## 2012-01-24 MED ORDER — NALOXONE HCL 0.4 MG/ML IJ SOLN: 0.4000 mg | INTRAMUSCULAR | Status: DC | PRN

## 2012-01-24 NOTE — Anesthesia Postprocedure Evaluation (Signed)
  Anesthesia Post-op Note  Patient: Cassie Berry  Procedure(s) Performed: Procedure(s) (LRB): ENDOSCOPIC RETROGRADE CHOLANGIOPANCREATOGRAPHY (ERCP) (N/A) SPHINCTEROTOMY (N/A)  Patient Location: PACU  Anesthesia Type: General  Level of Consciousness: awake and alert   Airway and Oxygen Therapy: Patient Spontanous Breathing  Post-op Pain: mild  Post-op Assessment: Post-op Vital signs reviewed, Patient's Cardiovascular Status Stable, Respiratory Function Stable, Patent Airway and No signs of Nausea or vomiting  Post-op Vital Signs: stable  Complications: No apparent anesthesia complications  

## 2012-01-24 NOTE — Progress Notes (Signed)
UR complete 

## 2012-01-24 NOTE — Telephone Encounter (Signed)
Per Dr. Juanda Chance, patient has been admitted for pancreatitis. If she is inpatient on Monday, the KUB will be done in the hospital. If she is out, will need KUB on Tuesday. Cancel possible EGD, would wait another week before doing this.

## 2012-01-24 NOTE — Progress Notes (Signed)
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 Amylase lipase afterwords were elevated (1-200 range).  Admitted with likey post ercp pancreatitis.  Still with abd pain, +minor flatus.  No appetite.  Says pain is somewhat better.    Objective: Vital signs in last 24 hours: Temp:  [97.4 F (36.3 C)-98.2 F (36.8 C)] 98 F (36.7 C) (06/14 0523) Pulse Rate:  [59-90] 90  (06/14 0523) Resp:  [12-34] 18  (06/14 0523) BP: (112-136)/(63-89) 115/74 mmHg (06/14 0523) SpO2:  [93 %-100 %] 94 % (06/14 0523) Weight:  [165 lb (74.844 kg)] 165 lb (74.844 kg) (06/14 0523) Last BM Date: 01/21/12 General: alert and oriented times 3 Heart: regular rate and rythm Abdomen: soft, mild to moderately tender, non-distended, trace bowel sounds    Lab Results:  [facility] 01/24/12 0439 01/23/12 1914  WBC 11.2* 14.4*  HGB 12.1 12.2  PLT 288 263  MCV 88.7 89.1    Basename 01/24/12 0439 01/23/12 1914  NA 139 137  K 4.2 3.5  CL 106 102  CO2 25 26  GLUCOSE 136* 184*  BUN 13 13  CREATININE 0.52 0.52  CALCIUM 8.7 8.3*    Basename 01/24/12 0439 01/23/12 1914  PROT 6.5 6.3  ALBUMIN 3.9 3.8  AST 466* 267*  ALT 405* 166*  ALKPHOS 104 96  BILITOT 0.7 1.2  BILIDIR 0.3 --  IBILI 0.4 --   No results found for this basename: INR:2 in the last 72 hours   Assessment/Plan: 46 y.o. female post ERCP pancreatitis, mild  Need to start IV fluids.  Continue IV pain meds.  NPO except sips H20 for now.  Ambulate in hall, bmet in am.   Rob Bunting, MD  01/24/2012, 7:19 AM [facility] Pager [phone]

## 2012-01-25 ENCOUNTER — Encounter (HOSPITAL_COMMUNITY): Payer: Self-pay | Admitting: Radiology

## 2012-01-25 ENCOUNTER — Inpatient Hospital Stay (HOSPITAL_COMMUNITY): Payer: 59

## 2012-01-25 ENCOUNTER — Encounter (HOSPITAL_COMMUNITY)
Admission: AD | Disposition: A | Discharge status: Home Health | Payer: Self-pay | Source: Ambulatory Visit | Attending: Gastroenterology

## 2012-01-25 DIAGNOSIS — T189XXA Foreign body of alimentary tract, part unspecified, initial encounter: Secondary | ICD-10-CM

## 2012-01-25 HISTORY — PX: ESOPHAGOGASTRODUODENOSCOPY: SHX5428

## 2012-01-25 LAB — BASIC METABOLIC PANEL
BUN: 10 mg/dL (ref 6–23)
CO2: 27 mEq/L (ref 19–32)
Chloride: 106 mEq/L (ref 96–112)
Glucose, Bld: 123 mg/dL — ABNORMAL HIGH (ref 70–99)
Potassium: 3.7 mEq/L (ref 3.5–5.1)

## 2012-01-25 SURGERY — EGD (ESOPHAGOGASTRODUODENOSCOPY)
Anesthesia: Moderate Sedation

## 2012-01-25 MED ORDER — FENTANYL NICU IV SYRINGE 50 MCG/ML
INJECTION | INTRAMUSCULAR | Status: DC | PRN
  Administered 2012-01-25: 12.5 ug via INTRAVENOUS
  Administered 2012-01-25: 25 ug via INTRAVENOUS
  Administered 2012-01-25: 12.5 ug via INTRAVENOUS

## 2012-01-25 MED ORDER — BUTAMBEN-TETRACAINE-BENZOCAINE 2-2-14 % EX AERO
INHALATION_SPRAY | CUTANEOUS | Status: DC | PRN
  Administered 2012-01-25: 2 via TOPICAL

## 2012-01-25 MED ORDER — ONDANSETRON HCL 4 MG/2ML IJ SOLN
4.0000 mg | Freq: Two times a day (BID) | INTRAMUSCULAR | Status: DC
  Administered 2012-01-25 – 2012-02-04 (×19): 4 mg via INTRAVENOUS
  Filled 2012-01-25 (×15): qty 2

## 2012-01-25 MED ORDER — MIDAZOLAM HCL 10 MG/2ML IJ SOLN
INTRAMUSCULAR | Status: AC
  Filled 2012-01-25: qty 2

## 2012-01-25 MED ORDER — DIPHENHYDRAMINE HCL 50 MG/ML IJ SOLN
INTRAMUSCULAR | Status: AC
  Filled 2012-01-25: qty 1

## 2012-01-25 MED ORDER — ACETAMINOPHEN 40 MG HALF SUPP: 1000.0000 mg | Freq: Once | RECTAL | Status: DC

## 2012-01-25 MED ORDER — ACETAMINOPHEN 650 MG RE SUPP
975.0000 mg | Freq: Once | RECTAL | Status: AC
  Administered 2012-01-25: 975 mg via RECTAL
  Filled 2012-01-25: qty 1

## 2012-01-25 MED ORDER — IOHEXOL 300 MG/ML  SOLN
100.0000 mL | Freq: Once | INTRAMUSCULAR | Status: AC | PRN
  Administered 2012-01-25: 100 mL via INTRAVENOUS

## 2012-01-25 MED ORDER — MIDAZOLAM HCL 10 MG/2ML IJ SOLN
INTRAMUSCULAR | Status: DC | PRN
  Administered 2012-01-25: 1 mg via INTRAVENOUS
  Administered 2012-01-25 (×2): 2 mg via INTRAVENOUS

## 2012-01-25 MED ORDER — FENTANYL CITRATE 0.05 MG/ML IJ SOLN
INTRAMUSCULAR | Status: AC
  Filled 2012-01-25: qty 2

## 2012-01-25 NOTE — Progress Notes (Signed)
Pt reported that her abd pain had changed from being just slightly right of her umbilicus to all the way around up to her liver and radiating to her right back.  She stated she was concerned and wanted the MD on call to be notified.  Pt also stated that her pain was at a level 8 even after I administered the dilaudid IV.  Notified Md, made him aware of the change in pt's pain, and obtained order for dilaudid pca.  Will continue to monitor.

## 2012-01-25 NOTE — Progress Notes (Signed)
Spoke with Dr. Christella Hartigan, informed him patient concerned, when she ambulated to bathroom with tech she became light-headed and dizzy, with a HR in 130s, patient states once she returned to bed she felt better and her HR is currently 103 with a bounding carotid pulse. MD gives no new orders states she has acute pancreatits this will be expected. Informed MD patient c/o no nausea right now is drinking 20oz water for ct scan

## 2012-01-25 NOTE — Progress Notes (Signed)
Several family members in room. Pt laying comfortably in bed.  Discussed treatment for severe acute pancreatitis (Ivfluids, IV pain meds, putting foley in now to monitor urine).  She is breathing very comfortably, CR stable on this AM labs.  She will remain on med floor for now but I did explain to family that she may need more closer monitoring depending on clinical course.  I  Don't think abx are necessary at this point, that may change as well.

## 2012-01-25 NOTE — Op Note (Signed)
[facility] [address] [place], [place]  [address]  ENDOSCOPY PROCEDURE REPORT  PATIENT:  Cassie, Berry  MR#:  [id number] BIRTHDATE:  1965-10-24, 45 yrs. old  GENDER:  female ENDOSCOPIST:  Rachael Fee, MD PROCEDURE DATE:  01/25/2012 PROCEDURE:  EGD w/ stent removal ASA CLASS:  Class IV INDICATIONS:  CT scan (reviewed with two radiologists) suggests pancreatic stent placed during ERCP two days ago is in improper position (not in pancreatic duct) MEDICATIONS:  Fentanyl 50 mcg IV, Versed 5 mg IV TOPICAL ANESTHETIC:  Cetacaine Spray  DESCRIPTION OF PROCEDURE:   After the risks benefits and alternatives of the procedure were thoroughly explained, informed consent was obtained.  The Pentax Gastroscope [id number] endoscope was introduced through the mouth and advanced to the second portion of the duodenum, without limitations.  The instrument was slowly withdrawn as the mucosa was fully examined. <<PROCEDUREIMAGES>> The pancreatic stent was found in second duodenum extending from major papilla which showed evidence of recent sphincterotomy. The stent was removed with long tooth forceps (see image1 and image2). Otherwise the examination was normal.    Retroflexion was not performed.  The scope was then withdrawn from the patient and the procedure completed.  COMPLICATIONS:  None  ENDOSCOPIC IMPRESSION: 1) Pancreatic stent removed 2) Otherwise normal examination  RECOMMENDATIONS: Continued supportive care for  acute pancreatitis (IV fluids, IV pain meds, NPO).  ______________________________ Rachael Fee, MD  cc: Lina Sar, MD; Barnet Pall, MD  n. eSIGNED:   Rachael Fee at 01/25/2012 11:56 AM  Venita Lick, [phone]

## 2012-01-25 NOTE — Progress Notes (Addendum)
[facility] Progress Note    Subjective: Worsening pain overnight, changed to dilaudid PCA.  The pain is more around right side and back now. Nausea, vomiting.  +flatus  Objective: Vital signs in last 24 hours: Temp:  [98.6 F (37 C)-99.6 F (37.6 C)] 99.6 F (37.6 C) (06/15 0604) Pulse Rate:  [86-108] 108  (06/15 0604) Resp:  [18-21] 21  (06/15 0604) BP: (101-130)/(56-78) 130/78 mmHg (06/15 0604) SpO2:  [93 %-100 %] 93 % (06/15 0604) Last BM Date: 01/21/12 General: alert and oriented times 3 Heart: regular rate and rythm Abdomen: soft, moderately tender throughout, worse in right side, right flank tenderness as well, non-distended, normal bowel sounds Lungs CTA   Lab Results:  [facility] 01/24/12 0439 01/23/12 1914  WBC 11.2* 14.4*  HGB 12.1 12.2  PLT 288 263  MCV 88.7 89.1    Basename 01/25/12 0447 01/24/12 0439 01/23/12 1914  NA 138 139 137  K 3.7 4.2 3.5  CL 106 106 102  CO2 27 25 26   GLUCOSE 123* 136* 184*  BUN 10 13 13   CREATININE 0.49* 0.52 0.52  CALCIUM 7.8* 8.7 8.3*    Basename 01/24/12 0439 01/23/12 1914  PROT 6.5 6.3  ALBUMIN 3.9 3.8  AST 466* 267*  ALT 405* 166*  ALKPHOS 104 96  BILITOT 0.7 1.2  BILIDIR 0.3 --  IBILI 0.4 --   Lipase 1762 today Amylase 2017 today   Medications: Scheduled Meds:   . bisacodyl  10 mg Rectal Once  . HYDROmorphone PCA 0.3 mg/mL   Intravenous Q4H  . pantoprazole (PROTONIX) IV  40 mg Intravenous Q1200  . DISCONTD: pantoprazole  40 mg Oral Q1200   Continuous Infusions:   . sodium chloride 150 mL/hr at 01/24/12 0748   PRN Meds:.diphenhydrAMINE, diphenhydrAMINE, HYDROmorphone (DILAUDID) injection, hyoscyamine, naloxone, ondansetron (ZOFRAN) IV, ondansetron (ZOFRAN) IV, promethazine, sodium chloride   Assessment/Plan: 46 y.o. female post ERCP pancreatitis (ERCP 2 days ago for possible SOD)  Pain is worsening and changed in character a bit over past 12-24 hours.  I think she needs CT scan  (retroperitoneal perf?, other).  Will increase IVF further.    Rob Bunting, MD  01/25/2012, 7:34 AM [facility] Pager [phone]    I spoke with radiologst: CT shows severe inflammatory pancreatitis, he thinks that the pancreatic duct stent is not in proper position (appears to extend from ampulla into the uncinate/head parenchyma).  I am setting her up to remove the stent endoscopically this AM.

## 2012-01-25 NOTE — Progress Notes (Signed)
No further c/o chest discomfort or nausea. Dr. Christella Hartigan in to see recently. Medicated for elevate temperature.

## 2012-01-25 NOTE — Progress Notes (Signed)
Spoke to Dr. Christella Hartigan, states to let patient know that he will have to do another EGD and move stent, patient will probably be sent for shortly. Patient informed and husband called for her

## 2012-01-25 NOTE — Interval H&P Note (Signed)
History and Physical Interval Note:  01/25/2012 11:29 AM  Cassie Berry  has presented today for surgery, with the diagnosis of acute pancreatitis, PD stent likely not in proper position  The various methods of treatment have been discussed with the patient and family. After consideration of risks, benefits and other options for treatment, the patient has consented to  Procedure(s) (LRB): ESOPHAGOGASTRODUODENOSCOPY (EGD) (N/A) as a surgical intervention .  The patients' history has been reviewed, patient examined, no change in status, stable for surgery.  I have reviewed the patients' chart and labs.  Questions were answered to the patient's satisfaction.     Rob Bunting

## 2012-01-25 NOTE — Progress Notes (Signed)
Dr. Christella Hartigan aware via phone pt c/o nausea medicated with Zofran, fever of 102.4, and c/o chest discomfort referring to mid chest and described as a burning, sharp sensation. Bed elevated and vitals obtained. bp 131/79  HR 111  resp 32   O2 sats 94 on 2L O2/[place]. See new orders received. MD plans to round on pt soon.

## 2012-01-25 NOTE — H&P (View-Only) (Signed)
[facility] Progress Note    Subjective: Worsening pain overnight, changed to dilaudid PCA.  The pain is more around right side and back now. Nausea, vomiting.  +flatus  Objective: Vital signs in last 24 hours: Temp:  [98.6 F (37 C)-99.6 F (37.6 C)] 99.6 F (37.6 C) (06/15 0604) Pulse Rate:  [86-108] 108  (06/15 0604) Resp:  [18-21] 21  (06/15 0604) BP: (101-130)/(56-78) 130/78 mmHg (06/15 0604) SpO2:  [93 %-100 %] 93 % (06/15 0604) Last BM Date: 01/21/12 General: alert and oriented times 3 Heart: regular rate and rythm Abdomen: soft, moderately tender throughout, worse in right side, right flank tenderness as well, non-distended, normal bowel sounds Lungs CTA   Lab Results:  Basename 01/24/12 0439 01/23/12 1914  WBC 11.2* 14.4*  HGB 12.1 12.2  PLT 288 263  MCV 88.7 89.1    Basename 01/25/12 0447 01/24/12 0439 01/23/12 1914  NA 138 139 137  K 3.7 4.2 3.5  CL 106 106 102  CO2 27 25 26  GLUCOSE 123* 136* 184*  BUN 10 13 13  CREATININE 0.49* 0.52 0.52  CALCIUM 7.8* 8.7 8.3*    Basename 01/24/12 0439 01/23/12 1914  PROT 6.5 6.3  ALBUMIN 3.9 3.8  AST 466* 267*  ALT 405* 166*  ALKPHOS 104 96  BILITOT 0.7 1.2  BILIDIR 0.3 --  IBILI 0.4 --   Lipase 1762 today Amylase 2017 today   Medications: Scheduled Meds:   . bisacodyl  10 mg Rectal Once  . HYDROmorphone PCA 0.3 mg/mL   Intravenous Q4H  . pantoprazole (PROTONIX) IV  40 mg Intravenous Q1200  . DISCONTD: pantoprazole  40 mg Oral Q1200   Continuous Infusions:   . sodium chloride 150 mL/hr at 01/24/12 0748   PRN Meds:.diphenhydrAMINE, diphenhydrAMINE, HYDROmorphone (DILAUDID) injection, hyoscyamine, naloxone, ondansetron (ZOFRAN) IV, ondansetron (ZOFRAN) IV, promethazine, sodium chloride   Assessment/Plan: 46 y.o. female post ERCP pancreatitis (ERCP 2 days ago for possible SOD)  Pain is worsening and changed in character a bit over past 12-24 hours.  I think she needs CT scan  (retroperitoneal perf?, other).  Will increase IVF further.    Ronya Gilcrest, MD  01/25/2012, 7:34 AM [facility] Pager [phone]    I spoke with radiologst: CT shows severe inflammatory pancreatitis, he thinks that the pancreatic duct stent is not in proper position (appears to extend from ampulla into the uncinate/head parenchyma).  I am setting her up to remove the stent endoscopically this AM. 

## 2012-01-25 NOTE — Progress Notes (Signed)
Patient being transferred by endo staff for EGD patient has questions for Dr. Christella Hartigan, RN from endo states she will have consent signed in endo after patient speaks to MD.

## 2012-01-26 LAB — CBC
HCT: 38.6 % (ref 36.0–46.0)
Hemoglobin: 12.9 g/dL (ref 12.0–15.0)
MCH: 30.3 pg (ref 26.0–34.0)
MCHC: 33.4 g/dL (ref 30.0–36.0)
RBC: 4.26 MIL/uL (ref 3.87–5.11)

## 2012-01-26 LAB — COMPREHENSIVE METABOLIC PANEL
ALT: 107 U/L — ABNORMAL HIGH (ref 0–35)
Alkaline Phosphatase: 63 U/L (ref 39–117)
BUN: 7 mg/dL (ref 6–23)
CO2: 23 mEq/L (ref 19–32)
Calcium: 7.5 mg/dL — ABNORMAL LOW (ref 8.4–10.5)
GFR calc Af Amer: >90 mL/min (ref >90)
GFR calc non Af Amer: >90 mL/min (ref >90)
Glucose, Bld: 89 mg/dL (ref 70–99)
Sodium: 138 mEq/L (ref 135–145)
Total Protein: 5.2 g/dL — ABNORMAL LOW (ref 6.0–8.3)

## 2012-01-26 MED ORDER — ACETAMINOPHEN 500 MG PO TABS
1000.0000 mg | ORAL_TABLET | Freq: Four times a day (QID) | ORAL | Status: DC | PRN
  Administered 2012-01-26 – 2012-02-22 (×42): 1000 mg via ORAL
  Filled 2012-01-26 (×10): qty 2
  Filled 2012-01-26: qty 1
  Filled 2012-01-26 (×5): qty 2
  Filled 2012-01-26: qty 1
  Filled 2012-01-26 (×26): qty 2

## 2012-01-26 MED ORDER — POTASSIUM CHLORIDE 2 MEQ/ML IV SOLN
INTRAVENOUS | Status: DC
  Filled 2012-01-26 (×2): qty 1000

## 2012-01-26 MED ORDER — KCL IN DEXTROSE-NACL 20-5-0.9 MEQ/L-%-% IV SOLN
INTRAVENOUS | Status: DC
  Administered 2012-01-26 – 2012-01-27 (×3): via INTRAVENOUS
  Filled 2012-01-26 (×5): qty 1000

## 2012-01-26 MED ORDER — SODIUM CHLORIDE 0.9 % IJ SOLN
10.0000 mL | INTRAMUSCULAR | Status: DC | PRN
  Administered 2012-01-27 – 2012-01-30 (×4): 10 mL

## 2012-01-26 MED ORDER — BUPROPION HCL ER (XL) 300 MG PO TB24
300.0000 mg | ORAL_TABLET | Freq: Every day | ORAL | Status: DC
  Administered 2012-01-26 – 2012-02-23 (×27): 300 mg via ORAL
  Filled 2012-01-26 (×30): qty 1

## 2012-01-26 MED ORDER — ACETAMINOPHEN 650 MG RE SUPP
975.0000 mg | Freq: Once | RECTAL | Status: DC
  Filled 2012-01-26: qty 1

## 2012-01-26 MED ORDER — HYDROMORPHONE HCL PF 1 MG/ML IJ SOLN
1.0000 mg | INTRAMUSCULAR | Status: DC | PRN
  Administered 2012-01-26 (×2): 1 mg via SUBCUTANEOUS
  Filled 2012-01-26 (×2): qty 1

## 2012-01-26 MED ORDER — SODIUM CHLORIDE 0.9 % IJ SOLN
10.0000 mL | Freq: Two times a day (BID) | INTRAMUSCULAR | Status: DC
  Administered 2012-01-28 – 2012-02-03 (×9): 10 mL
  Administered 2012-02-03: 40 mL
  Administered 2012-02-04 – 2012-03-02 (×23): 10 mL

## 2012-01-26 MED ORDER — ACETAMINOPHEN 500 MG PO TABS
1000.0000 mg | ORAL_TABLET | Freq: Once | ORAL | Status: AC
  Administered 2012-01-26: 1000 mg via ORAL
  Filled 2012-01-26: qty 2

## 2012-01-26 NOTE — Progress Notes (Signed)
Pt has fever 101 spoke to physician put order for 1000 mg tylenol PO q6. Annitta Needs, RN

## 2012-01-26 NOTE — Progress Notes (Signed)
Rechecked patients temp.  Temp 101.0.  Patient has been resting well; pain rating 3.  Tachy @ 110.  Notified Dr Christella Hartigan.  Order given for ice chips and tylenol.  Informed patient of plan of care.  Patient verbalizes understanding.

## 2012-01-26 NOTE — Progress Notes (Signed)
Pt IV infiltrated attemted IV restart failed contacted IV team. Pt in pain contacted Dr Christella Hartigan for a bolus of pain med. Annitta Needs, RN

## 2012-01-26 NOTE — Progress Notes (Signed)
Contacted IV team PICC line stat ordered by DR Christella Hartigan. Pt was provided dilauded subQ until PICC line inserted and pt stated feeling a little better but still in pain. Dr Christella Hartigan stated he did not want patient to go more than two hours without PICC. Annitta Needs, RN

## 2012-01-26 NOTE — Progress Notes (Signed)
[facility] Progress Note   Subjective: EGD yesterday to removed pancreatic stent.  Overall she is more comfortable this AM.  Breathing well on [place] 02.  She did get some rest overnight.  I gave her 1gm tylenol for temp to 101 this am.  No flatus.  Using PCA dilaudid.   Objective: Vital signs in last 24 hours: Temp:  [98.9 F (37.2 C)-102.5 F (39.2 C)] 98.9 F (37.2 C) (06/16 0642) Pulse Rate:  [104-115] 104  (06/16 0642) Resp:  [12-32] 20  (06/16 0642) BP: (114-148)/(68-98) 114/75 mmHg (06/16 0540) SpO2:  [91 %-98 %] 94 % (06/16 0642) Last BM Date: 01/22/12 General: alert and oriented times 3 Heart: regular rate and rythm Abdomen: soft, moderately tender throughout, non-distended, no bowel sounds   Intake/Output from previous day: 06/15 0701 - 06/16 0700 In: 4657.5 [I.V.:4657.5] Out: 1650 [Urine:1650]    Lab Results:  Basename 01/26/12 0445 01/24/12 0439 01/23/12 1914  WBC 11.0* 11.2* 14.4*  HGB 12.9 12.1 12.2  PLT 236 288 263  MCV 90.6 88.7 89.1    Basename 01/26/12 0445 01/25/12 0447 01/24/12 0439  NA 138 138 139  K 3.4* 3.7 4.2  CL 106 106 106  CO2 23 27 25   GLUCOSE 89 123* 136*  BUN 7 10 13   CREATININE 0.44* 0.49* 0.52  CALCIUM 7.5* 7.8* 8.7    Basename 01/26/12 0445 01/24/12 0439 01/23/12 1914  PROT 5.2* 6.5 6.3  ALBUMIN 2.6* 3.9 3.8  AST 46* 466* 267*  ALT 107* 405* 166*  ALKPHOS 63 104 96  BILITOT 0.7 0.7 1.2  BILIDIR -- 0.3 --  IBILI -- 0.4 --   No results found for this basename: INR:2 in the last 72 hours  [facility] Progress Note     Medications: Scheduled Meds:   . acetaminophen  975 mg Rectal Once  . acetaminophen  975 mg Rectal Once  . acetaminophen  1,000 mg Oral Once  . HYDROmorphone PCA 0.3 mg/mL   Intravenous Q4H  . ondansetron (ZOFRAN) IV  4 mg Intravenous Q12H  . pantoprazole (PROTONIX) IV  40 mg Intravenous Q1200  . DISCONTD: acetaminophen  1,000 mg Rectal Once   Continuous Infusions:   .  sodium chloride 200 mL/hr at 01/26/12 0422   PRN Meds:.butamben-tetracaine-benzocaine, diphenhydrAMINE, diphenhydrAMINE, fentaNYL, HYDROmorphone (DILAUDID) injection, hyoscyamine, iohexol, midazolam, naloxone, ondansetron (ZOFRAN) IV, ondansetron (ZOFRAN) IV, sodium chloride    Assessment/Plan: 46 y.o. female severe post ERCP pancreatitis  Overall a bit improved, but has an ileus.  She has a headache, wants to take her usual wellbutrin 300 which I will start, she is on ice chips, sips water with meds.  Cr is normal.  Up 3 liters yesterday.  Will back down a bit on IVf and add some K.  Otherwise continue current management.  Bmet, cbc in AM.   Rob Bunting, MD  01/26/2012, 7:24 AM [facility] Pager [phone]

## 2012-01-27 ENCOUNTER — Encounter (HOSPITAL_COMMUNITY): Payer: Self-pay | Admitting: Gastroenterology

## 2012-01-27 DIAGNOSIS — E876 Hypokalemia: Secondary | ICD-10-CM

## 2012-01-27 LAB — CBC
Platelets: 262 10*3/uL (ref 150–400)
RBC: 4.04 MIL/uL (ref 3.87–5.11)
RDW: 13.4 % (ref 11.5–15.5)
WBC: 10.3 10*3/uL (ref 4.0–10.5)

## 2012-01-27 LAB — BASIC METABOLIC PANEL
CO2: 27 mEq/L (ref 19–32)
Calcium: 8 mg/dL — ABNORMAL LOW (ref 8.4–10.5)
Chloride: 107 mEq/L (ref 96–112)
Chloride: 107 mEq/L (ref 96–112)
Creatinine, Ser: 0.4 mg/dL — ABNORMAL LOW (ref 0.50–1.10)
GFR calc Af Amer: >90 mL/min (ref >90)
GFR calc non Af Amer: >90 mL/min (ref >90)
Glucose, Bld: 140 mg/dL — ABNORMAL HIGH (ref 70–99)
Potassium: 3.3 mEq/L — ABNORMAL LOW (ref 3.5–5.1)
Sodium: 139 mEq/L (ref 135–145)
Sodium: 139 mEq/L (ref 135–145)

## 2012-01-27 MED ORDER — POTASSIUM CHLORIDE 2 MEQ/ML IV SOLN: INTRAVENOUS | Status: DC

## 2012-01-27 MED ORDER — ACETAMINOPHEN 650 MG RE SUPP
650.0000 mg | RECTAL | Status: DC | PRN
  Administered 2012-01-27 – 2012-01-31 (×5): 650 mg via RECTAL
  Filled 2012-01-27 (×5): qty 1

## 2012-01-27 MED ORDER — PROMETHAZINE HCL 25 MG RE SUPP
12.5000 mg | Freq: Four times a day (QID) | RECTAL | Status: DC | PRN
  Administered 2012-01-27 – 2012-01-29 (×3): 12.5 mg via RECTAL
  Administered 2012-01-29: 25 mg via RECTAL
  Administered 2012-01-29 – 2012-02-09 (×3): 12.5 mg via RECTAL
  Filled 2012-01-27 (×8): qty 1

## 2012-01-27 MED ORDER — POTASSIUM CHLORIDE 10 MEQ/100ML IV SOLN
10.0000 meq | INTRAVENOUS | Status: AC
  Administered 2012-01-27 (×4): 10 meq via INTRAVENOUS
  Filled 2012-01-27 (×4): qty 100

## 2012-01-27 MED ORDER — KCL IN DEXTROSE-NACL 40-5-0.9 MEQ/L-%-% IV SOLN
INTRAVENOUS | Status: DC
  Administered 2012-01-27 – 2012-01-31 (×10): via INTRAVENOUS
  Administered 2012-02-01: 150 mL via INTRAVENOUS
  Administered 2012-02-01 – 2012-02-02 (×3): via INTRAVENOUS
  Administered 2012-02-02: 150 mL via INTRAVENOUS
  Administered 2012-02-03 – 2012-02-05 (×9): via INTRAVENOUS
  Administered 2012-02-06: 1000 mL via INTRAVENOUS
  Administered 2012-02-06: 17:00:00 via INTRAVENOUS
  Administered 2012-02-06 – 2012-02-07 (×3): 1000 mL via INTRAVENOUS
  Administered 2012-02-08: 08:00:00 via INTRAVENOUS
  Filled 2012-01-27 (×46): qty 1000

## 2012-01-27 MED ORDER — PROMETHAZINE HCL 25 MG/ML IJ SOLN
6.2500 mg | Freq: Four times a day (QID) | INTRAMUSCULAR | Status: DC | PRN
  Administered 2012-01-28 – 2012-02-10 (×6): 6.25 mg via INTRAVENOUS
  Filled 2012-01-27 (×5): qty 1

## 2012-01-27 MED ORDER — PROMETHAZINE HCL 25 MG/ML IJ SOLN: 12.5000 mg | Freq: Once | INTRAMUSCULAR | Status: DC

## 2012-01-27 MED ORDER — PROMETHAZINE HCL 12.5 MG RE SUPP
12.5000 mg | Freq: Once | RECTAL | Status: AC
  Administered 2012-01-27: 12.5 mg via RECTAL
  Filled 2012-01-27: qty 1

## 2012-01-27 MED ORDER — SODIUM CHLORIDE 0.9 % IV SOLN
500.0000 mg | Freq: Three times a day (TID) | INTRAVENOUS | Status: DC
  Administered 2012-01-27 – 2012-01-29 (×6): 500 mg via INTRAVENOUS
  Filled 2012-01-27 (×8): qty 500

## 2012-01-27 NOTE — Progress Notes (Addendum)
     [facility] Daily Rounding Note 01/27/2012, 9:04 AM  SUBJECTIVE: Having intermittent intense left upper quadrant pain. Short-lived with severe at times. Continued fevers overnight though not this morning. Nauseated, promethazine did help. Feels like she has been relatively adequate pain relief of the PCA. Right upper quadrant pain is gone after pancreatic stent removal. No flatus. She took Tylenol last night and feels like that is still in her throat. OBJECTIVE:  General: Acutely ill, intermittently in pain  Vital signs in last 24 hours: Temp:  [99 F (37.2 C)-101.6 F (38.7 C)] 99 F (37.2 C) (06/17 0430) Pulse Rate:  [99-120] 118  (06/17 0430) Resp:  [16-22] 22  (06/17 0430) BP: (118-150)/(72-88) 122/82 mmHg (06/17 0430) SpO2:  [90 %-94 %] 92 % (06/17 0430) Last BM Date: 01/22/12  Heart: Tachycardic S1-S2 no murmur Chest: Clear with decreased breath sounds at the bases, with reduced effort Abdomen: Quiet, no bowel sounds heard and 30-60 seconds. She is tender in the left upper quadrant the abdomen is soft. Mild increase in tympany in the lower quadrants.  Extremities: There is no lower extremity edema. There is a PICC line in the right upper extremity Neuro/Psych:  7 appropriate mood and affect for the situation  Intake/Output from previous day: 06/16 0701 - 06/17 0700 In: 1819.5 [I.V.:1817.5] Out: 1350 [Urine:1350]  Intake/Output this shift:    Lab Results:  Basename 01/27/12 0410 01/26/12 0445  WBC 10.3 11.0*  HGB 12.2 12.9  HCT 36.2 38.6  PLT 262 236   BMET  Basename 01/27/12 0410 01/26/12 0445 01/25/12 0447  NA 139 138 138  K 3.3* 3.4* 3.7  CL 107 106 106  CO2 26 23 27   GLUCOSE 150* 89 123*  BUN 7 7 10   CREATININE 0.40* 0.44* 0.49*  CALCIUM 8.0* 7.5* 7.8*   LFT  Basename 01/26/12 0445  PROT 5.2*  ALBUMIN 2.6*  AST 46*  ALT 107*  ALKPHOS 63  BILITOT 0.7  BILIDIR --  IBILI --    ASSESMENT: 1) Acute pancreatitis post-ERCP - severe - not  improved overall 2) ? Some component of ileus 3) hypokalemia   PLAN: 1) add imipenem given the fevers and the fact that she was instrumented in the pancreatic duct, I think there is at least some increased risk of infection no pancreatitis without infection could cause all this. 2) change acetaminophen suppository. 3) add promethazine suppositories or low dose IV, for severe nausea. 4) repelet K   LOS: 4 days   Stan Head  01/27/2012, 9:04 AM  Pager [phone]

## 2012-01-28 ENCOUNTER — Encounter: Payer: 59 | Admitting: Internal Medicine

## 2012-01-28 ENCOUNTER — Inpatient Hospital Stay (HOSPITAL_COMMUNITY): Payer: 59

## 2012-01-28 DIAGNOSIS — E876 Hypokalemia: Secondary | ICD-10-CM | POA: Diagnosis not present

## 2012-01-28 MED ORDER — POTASSIUM CHLORIDE 10 MEQ/100ML IV SOLN
10.0000 meq | INTRAVENOUS | Status: AC
  Administered 2012-01-28 (×4): 10 meq via INTRAVENOUS
  Filled 2012-01-28 (×4): qty 100

## 2012-01-28 NOTE — Progress Notes (Signed)
Spoke with MD on call at [facility] concerning patient has a temperature of 102.4, 1000mg  of tylenol given and will recheck in 1 hr, no new orders [facility], Myrtie Hawk RN 8:43am 01-28-12

## 2012-01-28 NOTE — Progress Notes (Signed)
[facility] Progress Note  SUBJECTIVE: generally uncomfortable.   OBJECTIVE:  Vital signs in last 24 hours: Temp:  [100 F (37.8 C)-102.4 F (39.1 C)] 100.7 F (38.2 C) (06/18 0937) Pulse Rate:  [107-123] 110  (06/18 0937) Resp:  [16-25] 20  (06/18 0559) BP: (125-132)/(75) 132/75 mmHg (06/18 0559) SpO2:  [92 %-96 %] 96 % (06/18 0758) Last BM Date: 01/22/12 General:    white female uncomfortable but NAD Heart:  Tachycardia 110-120 Lungs: Respirations even and unlabored, significantly decreased breath sounds at bilateral bases.  Abdomen:  Soft, nontender and nondistended. Normal bowel sounds. Extremities:  Without edema. Neurologic:  Alert and oriented,  grossly normal neurologically. Psych:  Cooperative. Normal mood and affect.  Intake/Output from previous day: 06/17 0701 - 06/18 0700 In: 5680 [I.V.:5380; IV Piggyback:300] Out: 2600 [Urine:2600]  Good urine output.  Intake/Output this shift:    Lab Results:  Basename 01/27/12 0410 01/26/12 0445  WBC 10.3 11.0*  HGB 12.2 12.9  HCT 36.2 38.6  PLT 262 236   BMET  Basename 01/27/12 1040 01/27/12 0410 01/26/12 0445  NA 139 139 138  K 3.3* 3.3* 3.4*  CL 107 107 106  CO2 27 26 23   GLUCOSE 140* 150* 89  BUN 6 7 7   CREATININE 0.40* 0.40* 0.44*  CALCIUM 7.6* 8.0* 7.5*   LFT  Basename 01/26/12 0445  PROT 5.2*  ALBUMIN 2.6*  AST 46*  ALT 107*  ALKPHOS 63  BILITOT 0.7  BILIDIR --  IBILI --    ASSESSMENT / PLAN:  1. Post-ERCP pancreatitis with ascites. Since stent removed the RUQ pain is better. She continues to have epigastric / LUQ pain but pain meds are helping. She is getting Zofran and Phenergan for nausea. Continues to be febrile, temp >102 this am.  Primaxin started yesterday.   2. Fever probably secondary to pancreatitis but need to exclude pulmonary with abnormal lung sounds. WBC okay at 10.3 yesterday. Her sats on oxygen are okay. Will obtain CXR. Continue Primaxin,  incentive spirometry.   Temp down to 100.7 now after tylenol.   3. Hypokalemia, given IV potassium yesterday, has K+ in IVF. Will recheck BMET in am. Potassium 3.3 yesterday.   4. Tachycardia, likely secondary to fever.     LOS: 5 days   [name]  01/28/2012, 9:45 AM  [facility] Attending  I have also seen and assessed the patient and agree with the above note. She is better than she was this AM when febrile. Pain a bit less. I heard a few BS and abd less tender today. CXR IMPRESSION:  Low lung volumes. Left lower lobe atelectasis or infiltrate with  small left effusion. Suspect mild interstitial edema.   I think she is a little better now, has severe pancreatitis. I do not think she has pneumonia. Reassess tomorrow. Allow sips of clears. Replete Moreen Fowler, MD, [facility] [phone] (pager) 01/28/2012 12:41 PM

## 2012-01-28 NOTE — Progress Notes (Signed)
Orders received to remove foley cath by MD [facility], Myrtie Hawk RN 01-28-12 10:50am

## 2012-01-29 ENCOUNTER — Inpatient Hospital Stay (HOSPITAL_COMMUNITY): Payer: 59

## 2012-01-29 LAB — BASIC METABOLIC PANEL
BUN: 4 mg/dL — ABNORMAL LOW (ref 6–23)
CO2: 26 mEq/L (ref 19–32)
Chloride: 103 mEq/L (ref 96–112)
GFR calc Af Amer: >90 mL/min (ref >90)
Potassium: 4 mEq/L (ref 3.5–5.1)

## 2012-01-29 MED ORDER — IOHEXOL 300 MG/ML  SOLN
100.0000 mL | Freq: Once | INTRAMUSCULAR | Status: AC | PRN
  Administered 2012-01-29: 100 mL via INTRAVENOUS

## 2012-01-29 MED ORDER — SODIUM CHLORIDE 0.9 % IV SOLN
500.0000 mg | Freq: Four times a day (QID) | INTRAVENOUS | Status: DC
  Administered 2012-01-29 – 2012-02-03 (×20): 500 mg via INTRAVENOUS
  Filled 2012-01-29 (×21): qty 500

## 2012-01-29 MED ORDER — HYDROMORPHONE 0.3 MG/ML IV SOLN: INTRAVENOUS | Status: DC

## 2012-01-29 NOTE — Progress Notes (Addendum)
[facility] Progress Note  SUBJECTIVE: Just doesn't feel well. She has ongoing abdominal pain, nausea, elevated heart rate when she ambulates. Hallucinations last night - Dilaudid reduced  OBJECTIVE:  Vital signs in last 24 hours: Temp:  [98 F (36.7 C)-101.8 F (38.8 C)] 99.6 F (37.6 C) (06/19 0528) Pulse Rate:  [111-113] 111  (06/19 0528) Resp:  [18-26] 26  (06/19 0622) BP: (124-142)/(75-80) 124/75 mmHg (06/19 0528) SpO2:  [93 %-98 %] 93 % (06/19 0528) Last BM Date: 01/22/12 General:    white female in NAD. O2 per [place] Heart:  Slightly tachycardic. Lungs: Respirations slightly labored.  Decreased breath sounds at bases but sound slightly improved over yesterday.  Abdomen:  Soft, moderate diffuse upper abdominal tenderness, a few bowel sounds. Mild subcutaneous edema Extremities:  Without edema. Neurologic:  Alert and oriented,  grossly normal neurologically. Psych:  Cooperative. Normal mood and affect.   Lab Results:  BMET  Basename 01/29/12 0609 01/27/12 1040 01/27/12 0410  NA 135 139 139  K 4.0 3.3* 3.3*  CL 103 107 107  CO2 26 27 26   GLUCOSE 129* 140* 150*  BUN 4* 6 7  CREATININE 0.36* 0.40* 0.40*  CALCIUM 7.8* 7.6* 8.0*   Studies/Results: Dg Chest Port 1v Same Day  01/28/2012  *RADIOLOGY REPORT*  Clinical Data: Abnormal breath sounds, fever.  PORTABLE CHEST - 1 VIEW SAME DAY  Comparison: 06/04/2011  Findings: Right PICC line tip is in the upper SVC.  Low lung volumes with left lower lobe atelectasis or infiltrate.  Small left pleural effusion.  Cardiomegaly with diffuse interstitial prominence may reflect interstitial edema. No acute bony abnormality.  IMPRESSION: Low lung volumes.  Left lower lobe atelectasis or infiltrate with small left effusion.  Suspect mild interstitial edema.  Original Report Authenticated By: Cyndie Chime, M.D.     ASSESSMENT / PLAN:  1. Post-ERCP pancreatitis with ascites. She continues to have epigastric / LUQ pain. Pain  meds help but don't alleviate the pain. She is still needeing Zofran and Phenergan for nausea. Tried jello but caused more pain. Continue supportive care.  Continue  SIPS of clears. 2. Fever probably secondary to pancreatitis. Temp max 102.4 CXR yesterday showed low lung volumes, LLL atelectasis or infiltrate with small left effusion, possibly mild interstitial edema. Her sats( on oxygen) staying above 92% but her breathing is slightly labored at rest. She may have some interstitial pulmonary edema but also splinting because of abdominal pain.  On day #3 of Primaxin. Continue incentive spirometry. Encouraged ambulation with portable oxygen. Will get pa / lat cxr.  3. Hypokalemia, resolved after repletion. Continue IVF with potassium   4. Mild tachycardia, likely secondary to fever.    5. Mild anasarca, she is on 150cc/hr IVF which is reasonable since NPO/sips clears 6. Hallucinations due to narcotics/fever- have reduced Dilaudidi dose   LOS: 6 days   [name]  01/29/2012, 9:40 AM   [facility] Attending  I have also seen and assessed the patient and agree with the above note, some additions made.  She is persistently ill and febrile without much improvement though I think she is slightly better.  Plan for CT abd and pelvis with pancreatic protocol today to look for pancreatic necrosis.  Iva Boop, MD, [facility] [phone] (pager) 01/29/2012 11:21 AM  CT shows slight worsening of pancreatitis and ascites. No necrosis. I called her with results. Will likely need TPN - will discuss further tomorrow - see what bowel sounds are tomorrow. Fevers  most likely from pancreatitis.  Iva Boop, MD, [facility] [phone] (pager) 01/29/2012 5:24 PM

## 2012-01-30 ENCOUNTER — Inpatient Hospital Stay (HOSPITAL_COMMUNITY): Payer: 59

## 2012-01-30 DIAGNOSIS — R509 Fever, unspecified: Secondary | ICD-10-CM | POA: Diagnosis not present

## 2012-01-30 DIAGNOSIS — E871 Hypo-osmolality and hyponatremia: Secondary | ICD-10-CM | POA: Diagnosis not present

## 2012-01-30 DIAGNOSIS — J90 Pleural effusion, not elsewhere classified: Secondary | ICD-10-CM | POA: Diagnosis present

## 2012-01-30 LAB — URINALYSIS, ROUTINE W REFLEX MICROSCOPIC
Hgb urine dipstick: NEGATIVE
Nitrite: NEGATIVE
Specific Gravity, Urine: 1.01 (ref 1.005–1.030)
Urobilinogen, UA: 2 mg/dL — ABNORMAL HIGH (ref 0.0–1.0)

## 2012-01-30 LAB — DIFFERENTIAL
Basophils Absolute: 0 10*3/uL (ref 0.0–0.1)
Eosinophils Absolute: 0.3 10*3/uL (ref 0.0–0.7)
Lymphocytes Relative: 7 % — ABNORMAL LOW (ref 12–46)
Monocytes Relative: 1 % — ABNORMAL LOW (ref 3–12)
Neutro Abs: 3.4 10*3/uL (ref 1.7–7.7)
Neutrophils Relative %: 85 % — ABNORMAL HIGH (ref 43–77)

## 2012-01-30 LAB — PROCALCITONIN: Procalcitonin: 25.12 ng/mL

## 2012-01-30 LAB — COMPREHENSIVE METABOLIC PANEL
Albumin: 2.4 g/dL — ABNORMAL LOW (ref 3.5–5.2)
BUN: 4 mg/dL — ABNORMAL LOW (ref 6–23)
Calcium: 7.8 mg/dL — ABNORMAL LOW (ref 8.4–10.5)
Creatinine, Ser: 0.4 mg/dL — ABNORMAL LOW (ref 0.50–1.10)
GFR calc Af Amer: >90 mL/min (ref >90)
Glucose, Bld: 109 mg/dL — ABNORMAL HIGH (ref 70–99)
Potassium: 3.6 mEq/L (ref 3.5–5.1)
Total Protein: 5.8 g/dL — ABNORMAL LOW (ref 6.0–8.3)

## 2012-01-30 LAB — CBC
HCT: 31.7 % — ABNORMAL LOW (ref 36.0–46.0)
RDW: 14.1 % (ref 11.5–15.5)
WBC: 4 10*3/uL (ref 4.0–10.5)

## 2012-01-30 LAB — LIPASE, BLOOD: Lipase: 60 U/L — ABNORMAL HIGH (ref 11–59)

## 2012-01-30 MED ORDER — SODIUM CHLORIDE 0.9 % IJ SOLN: 9.0000 mL | INTRAMUSCULAR | Status: DC | PRN

## 2012-01-30 MED ORDER — LORAZEPAM 2 MG/ML IJ SOLN
0.5000 mg | Freq: Four times a day (QID) | INTRAMUSCULAR | Status: DC | PRN
  Administered 2012-01-30 – 2012-01-31 (×3): 0.5 mg via INTRAVENOUS
  Filled 2012-01-30 (×2): qty 1

## 2012-01-30 MED ORDER — MORPHINE SULFATE (PF) 1 MG/ML IV SOLN
INTRAVENOUS | Status: DC
  Administered 2012-01-30: 13:00:00 via INTRAVENOUS
  Administered 2012-01-30: 2 mg via INTRAVENOUS
  Administered 2012-01-30: 5 mg via INTRAVENOUS
  Administered 2012-01-31: 1 mg via INTRAVENOUS
  Administered 2012-01-31: 8 mg via INTRAVENOUS
  Administered 2012-01-31: 1 mg via INTRAVENOUS
  Administered 2012-01-31: 5 mg via INTRAVENOUS
  Administered 2012-01-31: 9 mg via INTRAVENOUS
  Administered 2012-01-31: 21:00:00 via INTRAVENOUS
  Administered 2012-02-01: 2 mg via INTRAVENOUS
  Administered 2012-02-01 (×2): 3 mg via INTRAVENOUS
  Administered 2012-02-01 (×2): 6 mg via INTRAVENOUS
  Administered 2012-02-02: 3 mg via INTRAVENOUS
  Administered 2012-02-02: 01:00:00 via INTRAVENOUS
  Administered 2012-02-02: 2 mg via INTRAVENOUS
  Administered 2012-02-02: 4 mg via INTRAVENOUS
  Administered 2012-02-02: 6 mg via INTRAVENOUS
  Administered 2012-02-02: 2 mg via INTRAVENOUS
  Administered 2012-02-02: 6 mg via INTRAVENOUS
  Administered 2012-02-03: 14:00:00 via INTRAVENOUS
  Administered 2012-02-03: 2 mg via INTRAVENOUS
  Administered 2012-02-03: 5 mg via INTRAVENOUS
  Administered 2012-02-03: 4 mg via INTRAVENOUS
  Administered 2012-02-03: 2 mg via INTRAVENOUS
  Administered 2012-02-04: 1 mg via INTRAVENOUS
  Administered 2012-02-04 (×3): 4 mg via INTRAVENOUS
  Administered 2012-02-05: 7 mg via INTRAVENOUS
  Administered 2012-02-05: 11:00:00 via INTRAVENOUS
  Administered 2012-02-05: 6 mg via INTRAVENOUS
  Administered 2012-02-05: 25 mg via INTRAVENOUS
  Administered 2012-02-06 (×2): 1 mg via INTRAVENOUS
  Administered 2012-02-06 (×2): 3 mg via INTRAVENOUS
  Administered 2012-02-06: 5 mg via INTRAVENOUS
  Administered 2012-02-07: 1 mg via INTRAVENOUS
  Administered 2012-02-07: 3 mg via INTRAVENOUS
  Administered 2012-02-07: 1 mg via INTRAVENOUS
  Administered 2012-02-07: 05:00:00 via INTRAVENOUS
  Administered 2012-02-07: 4 mg via INTRAVENOUS
  Administered 2012-02-08 (×4): 1 mg via INTRAVENOUS
  Administered 2012-02-08: 3 mg via INTRAVENOUS
  Filled 2012-01-30 (×6): qty 25

## 2012-01-30 MED ORDER — NALOXONE HCL 0.4 MG/ML IJ SOLN: 0.4000 mg | INTRAMUSCULAR | Status: DC | PRN

## 2012-01-30 MED ORDER — LORAZEPAM 2 MG/ML IJ SOLN
INTRAMUSCULAR | Status: AC
  Filled 2012-01-30: qty 1

## 2012-01-30 NOTE — Progress Notes (Signed)
UR complete 

## 2012-01-30 NOTE — Progress Notes (Signed)
Late entry- Pt with episode of chills around 0230am fever checked orally registering at 103.0 HR elevated in 150's. Rapid response notified, ice packs applied to chill patient.  Fever rechecked rectally and down to 102.2, chilling measures continued until patients fever down to 101.1.  Dr. Leone Payor notified with initial chilling episode.  Orders given for chest x-ray, blood cultures, urine cultures ans labs.  Orders carried out.  Patients temp rechecked at 0600 and down to 99.5.  Will continue to monitor closely.

## 2012-01-30 NOTE — Progress Notes (Signed)
[facility] Progress Note  SUBJECTIVE: "Had better days". Abdominal pain better, except when tries more than sips. She is terribly upset about periods of altered mental status, (poor memory, hallucinations)  OBJECTIVE:  Vital signs in last 24 hours: Temp:  [99 F (37.2 C)-103 F (39.4 C)] 99 F (37.2 C) (06/20 0602) Pulse Rate:  [106-142] 106  (06/20 0800) Resp:  [9-29] 18  (06/20 0800) BP: (115-159)/(65-88) 116/70 mmHg (06/20 0602) SpO2:  [94 %-99 %] 94 % (06/20 0800) Last BM Date: 01/29/12 General:    white female in NAD Heart:  Regular rate and rhythm; no murmurs Lungs: Respirations even and unlabored, lungs CTA bilaterally Abdomen:  Soft, nontender and nondistended. Normal bowel sounds. Extremities:  Without edema. Neurologic:  Alert and oriented,  grossly normal neurologically. Psych:  Cooperative. Normal mood and affect.  Intake/Output from previous day: 06/19 0701 - 06/20 0700 In: 3930 [P.O.:480; I.V.:3450] Out: 6800 [Urine:6800] Intake/Output this shift:   *Good urine output  Lab Results:  Basename 01/30/12 0300  WBC 4.0  HGB 10.9*  HCT 31.7*  PLT 186   BMET  Basename 01/30/12 0300 01/29/12 0609  NA 132* 135  K 3.6 4.0  CL 100 103  CO2 22 26  GLUCOSE 109* 129*  BUN 4* 4*  CREATININE 0.40* 0.36*  CALCIUM 7.8* 7.8*   LFT  Basename 01/30/12 0300  PROT 5.8*  ALBUMIN 2.4*  AST 67*  ALT 61*  ALKPHOS 210*  BILITOT 1.5*  BILIDIR --  IBILI --    Studies/Results: Ct Abdomen Pelvis W Wo Contrast  01/29/2012  *RADIOLOGY REPORT*  Clinical Data: Abdominal pain with fever.  Follow up pancreatitis. Evaluate for necrosis.  CT ABDOMEN AND PELVIS WITHOUT AND WITH CONTRAST  Technique:  Multidetector CT imaging of the abdomen and pelvis was performed without contrast material in one or both body regions, followed by contrast material(s) and further sections in one or both body regions.  Contrast: OMNIPAQUE IOHEXOL 300 MG/ML  SOLN  Comparison:  abdominal pelvic CT 01/25/2012.  Findings: There are enlarging bilateral pleural effusions. Associated bibasilar atelectasis has not significantly changed.  The biliary stent has been removed in the interval.  The pancreas remains diffusely edematous with homogeneous enlargement. The precontrast images demonstrate no evidence of acute hemorrhage within the pancreas. Postcontrast, the pancreas enhances homogeneously.  There is no evidence of pancreatic necrosis.  The extensive peripancreatic inflammatory changes and ill-defined fluid have mildly progressed.  There is no focal or drainable fluid collection.  There is apparent mild extrinsic mass effect on the superior mesenteric vein (best seen on coronal image 39 of series 602). No intravascular thrombus is seen.  Fluid surrounds the duodenum, hepatic flexure of the colon and extends inferiorly along the right pericolic gutter. The perihepatic component of the ascites has slightly decreased.  The cholecystectomy bed has a stable appearance.  There is no significant biliary dilatation.  The liver, spleen, adrenal glands and kidneys appear normal.  There is no evidence of bowel obstruction or focal bowel wall thickening.  A moderate amount of free pelvic fluid appears stable. There is some air within the lumen of the urinary bladder which is presumably iatrogenic.  No Foley catheter is in place.  The uterus and ovaries appear stable.  There is increased flank edema, asymmetric to the left.  IMPRESSION:  1.  Slight worsening of severe pancreatitis with extensive retroperitoneal inflammation and ill-defined fluid.  No drainable fluid collection identified. 2.  No evidence of pancreatic hemorrhage or necrosis.  3.  Slightly increased ascites, pleural effusions and flank edema.  Original Report Authenticated By: Gerrianne Scale, M.D.   Dg Chest 2 View  01/30/2012  *RADIOLOGY REPORT*  Clinical Data: Fever and dyspnea.  Follow-up left pleural effusion  CHEST - 2 VIEW   Comparison: 01/30/2012 at 0325 hours and 06/04/2011  Findings: A right peripheral central venous catheter is stable in position.  Heart size appears within normal limits. Prominence of the hilar regions bilaterally is apparent but is felt likely due to projection combined with low lung volumes and can be reassessed once the patient is able to cooperate with an improved inspiration.  There is a persistent left pleural effusion with associated lingular and left lower lobe infiltrate.   Some prominence of the interstitial markings with interstitial septal lines and fissural fluid is compatible with associated interstitial edema. This appearance has improved since the previous exam.  Given the history of fever and dyspnea, left lower lobe pneumonia is not excluded.  IMPRESSION: Lingular and left lower lobe infiltrate with associated left pleural effusion worrisome for bronchopneumonia given the history of fever.  Improving interstitial edema pattern.  Original Report Authenticated By: Bertha Stakes, M.D.   Dg Chest Port 1 View  01/30/2012  *RADIOLOGY REPORT*  Clinical Data: Fever and dyspnea.  PORTABLE CHEST - 1 VIEW  Comparison: Chest radiograph performed 01/28/2012  Findings: The right PICC is noted ending overlying the proximal SVC.  The lungs are hypoexpanded.  Vascular congestion is noted, with bilateral central and left basilar airspace opacities, likely reflecting mildly worsened pulmonary edema.  A small left pleural effusion is seen.  No pneumothorax is identified.  The cardiomediastinal silhouette is within normal limits.  No acute osseous abnormalities are seen.  IMPRESSION: Lungs hypoexpanded; vascular congestion, with bilateral central and left basilar airspace opacities, likely reflecting mildly worsened pulmonary edema.  Small left pleural effusion seen.  Original Report Authenticated By: Tonia Ghent, M.D.     ASSESSMENT / PLAN:  1. Post-ERCP pancreatitis with ascites. Repeat CTscan  yesterday showed slight worsening of severe pancreatitis with extensive retroperitoneal inflammation and ill-defined fluid. No necrosis, increased ascites and pleural effusions. She actually looks better today, more comfortable. Her pain is better except for when she tries to take more than just sips of fluids. If she doesn't improve in next couple of days then we may need to start TNA. Another option is jejunal feedings. Continue supportive care, Primaxin, SIPS of clears.   2. Fever, Temp max 103.  Probably secondary to pancreatitis but 2 view chest worrisome for bronchopneumonia.. Continue incentive spirometry. Pulmonary Medicine consulted for evaluation. If workup negative then may ask Infectious Disease to see regarding fever. Also ? If she has had drug fever from Dilaudid Iva Boop, MD, [facility]    3. Mild tachycardia, likely secondary to fever.   4. Mental status changes with confusion / disorientation. This is probably multi-factorial ( pain meds, high fever, acute illness). Will change Dilaudid to Morphine to see if that helps at all.   5. Mild hyponatremia, NA 132. Monitor for now. Check am BMET     LOS: 7 days   [name]  01/30/2012, 11:27 AM   [facility] Attending  I have also seen and assessed the patient and agree with the above note. She has passed gas and defecated. BS heard, less abdominal tenderness. In better spirits though had a terrible night with fever, hallucinations and incontinence - rigors. I ? If she had a seizure or  severe febrile reaction.  At any rate we have changed to morphine. Dr. Sherene Sires does not think she has pneumonia.  See what tomorrow brings, with bowel function returning may be able to liberalize diet - quantity anyway.  If fevers gone will probably dc Imipenem as she does not have pancreatic necrosis but i had kept it on due to persistent fevers.  Iva Boop, MD, [facility] [phone] (pager) 01/30/2012 4:10  PM

## 2012-01-30 NOTE — Progress Notes (Signed)
INITIAL ADULT NUTRITION ASSESSMENT Date: 01/30/2012   Time: 12:14 PM Reason for Assessment: NPO/clear liquids x 7 days  ASSESSMENT: Female 46 y.o.  Dx: ERCP with sphincterotomy  Food/Nutrition Related Hx: Pt admitted with intermittent RUQ abdominal pain which is worse after eating fatty meals. Pt reports this pain started Friday PTA. Pt reports typically eating 3 meals/day with snacks, trying to follow a healthy diet. Pt reports typically having a great appetite and reports weight has been stable. POD# 7 ERCP with sphincterotomy. Afterwards pt developed nausea and abdominal pain. POD# 5 EGD with pancreatic stent removal with pt reported resolved RUQ pain. Noted events of rapid response this morning with elevated heart rate and fever. Pt reports minimal intake since pt started on clear liquid diet and c/o a little nausea. Pt reports feeling very weak, states she doesn't have the strength to even stand. Pt thought to have post-ERCP pancreatitis with ascites. Lipase improved from 1762 U/L on 6/15 to 60 U/L today and amylase improved from 2017 U/L on 6/15 to 101 U/L today. Awaiting MD decision on diet advancement versus nasojejunal tube feeding as pt has not eaten in 7 days.  Hx:  Past Medical History  Diagnosis Date  . GERD (gastroesophageal reflux disease)   . Anxiety   . Migraine headache   . PONV (postoperative nausea and vomiting)   . Pneumonia     02/2011   Related Meds:  Scheduled Meds:   . acetaminophen  975 mg Rectal Once  . buPROPion  300 mg Oral Daily  . imipenem-cilastatin  500 mg Intravenous Q6H  . LORazepam      . morphine   Intravenous Q4H  . ondansetron (ZOFRAN) IV  4 mg Intravenous Q12H  . pantoprazole (PROTONIX) IV  40 mg Intravenous Q1200  . sodium chloride  10-40 mL Intracatheter Q12H  . DISCONTD: HYDROmorphone PCA 0.3 mg/mL   Intravenous Q4H  . DISCONTD: HYDROmorphone PCA 0.3 mg/mL   Intravenous Q4H   Continuous Infusions:   . dextrose 5 % and 0.9 % NaCl with KCl  40 mEq/L 150 mL/hr at 01/30/12 0637   PRN Meds:.acetaminophen, acetaminophen, butamben-tetracaine-benzocaine, diphenhydrAMINE, diphenhydrAMINE, hyoscyamine, iohexol, LORazepam, midazolam, naloxone, ondansetron (ZOFRAN) IV, ondansetron (ZOFRAN) IV, promethazine, promethazine, sodium chloride, sodium chloride, sodium chloride, DISCONTD: fentaNYL, DISCONTD:  HYDROmorphone (DILAUDID) injection, DISCONTD:  HYDROmorphone (DILAUDID) injection, DISCONTD: naloxone  Ht: 5\' 4"  (162.6 cm)  Wt: 165 lb (74.844 kg)  Ideal Wt: 120 lb % Ideal Wt: 137  Usual Wt: 160 lb % Usual Wt: 103  Body mass index is 28.32 kg/(m^2).   Labs:  CMP     Component Value Date/Time   NA 132* 01/30/2012 0300   K 3.6 01/30/2012 0300   CL 100 01/30/2012 0300   CO2 22 01/30/2012 0300   GLUCOSE 109* 01/30/2012 0300   BUN 4* 01/30/2012 0300   CREATININE 0.40* 01/30/2012 0300   CALCIUM 7.8* 01/30/2012 0300   PROT 5.8* 01/30/2012 0300   ALBUMIN 2.4* 01/30/2012 0300   AST 67* 01/30/2012 0300   ALT 61* 01/30/2012 0300   ALKPHOS 210* 01/30/2012 0300   BILITOT 1.5* 01/30/2012 0300   GFRNONAA >90 01/30/2012 0300   GFRAA >90 01/30/2012 0300   Lipase  Date Value Range Status  01/30/2012 60* 11 - 59 U/L Final   Amylase  Date Value Range Status  01/30/2012 101  0 - 105 U/L Final      Intake/Output Summary (Last 24 hours) at 01/30/12 1237 Last data filed at 01/30/12 0600  Gross  per 24 hour  Intake 3292.5 ml  Output   4800 ml  Net -1507.5 ml   Last BM - 01/29/12  Diet Order: Clear Liquid   IVF:    dextrose 5 % and 0.9 % NaCl with KCl 40 mEq/L Last Rate: 150 mL/hr at 01/30/12 0637    Estimated Nutritional Needs:   Kcal:1900-2200 Protein:110-120g Fluid:1.9-2.2L  NUTRITION DIAGNOSIS: -Inadequate oral intake (NI-2.1).  Status: Ongoing  RELATED TO: post-ERCP pancreatitis  AS EVIDENCE BY: clear liquid diet  MONITORING/EVALUATION(Goals): Advance diet as tolerated to low fat diet  EDUCATION NEEDS: - Education not  appropriate at this time as pt wanting to rest  INTERVENTION: Diet advancement versus nasojejunal tube feeding per MD. RD to monitor for nutrition plans.   Dietitian #: [phone]  DOCUMENTATION CODES Per approved criteria  -Not Applicable    Marshall Cork 01/30/2012, 12:14 PM

## 2012-01-30 NOTE — Consult Note (Signed)
Name: Cassie Berry MRN: [id number] DOB: 01-22-1966    LOS: 7  Referring Provider:  Leone Payor Reason for Referral:  ? 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    Past Medical History  Diagnosis Date  . GERD (gastroesophageal reflux disease)   . Anxiety   . Migraine headache   . PONV (postoperative nausea and vomiting)   . Pneumonia     02/2011   Past Surgical History  Procedure Date  . Appendectomy   . Cholecystectomy   . Ercp 01/23/2012    Procedure: ENDOSCOPIC RETROGRADE CHOLANGIOPANCREATOGRAPHY (ERCP);  Surgeon: Louis Meckel, MD;  Location: [facility];  Service: Endoscopy;  Laterality: N/A;  . Sphincterotomy 01/23/2012    Procedure: SPHINCTEROTOMY;  Surgeon: Louis Meckel, MD;  Location: [facility];  Service: Endoscopy;  Laterality: N/A;  . Esophagogastroduodenoscopy 01/25/2012    Procedure: ESOPHAGOGASTRODUODENOSCOPY (EGD);  Surgeon: Rachael Fee, MD;  Location: [facility];  Service: Endoscopy;  Laterality: N/A;   Prior to Admission medications   Medication Sig Start Date End Date Taking? Authorizing Provider  buPROPion (WELLBUTRIN XL) 300 MG 24 hr tablet Take 300 mg by mouth daily.   Yes Historical Provider, MD  cetirizine (ZYRTEC) 10 MG tablet Take 10 mg by mouth daily as needed. For allergies.   Yes Historical Provider, MD  cholecalciferol (VITAMIN D) 1000 UNITS tablet Take 1,000 Units by mouth daily.   Yes Historical Provider, MD  esomeprazole (NEXIUM) 40 MG packet Take 40 mg by mouth daily before breakfast. 12/25/11 12/24/12 Yes Amy S  Esterwood, PA  ondansetron (ZOFRAN) 4 MG tablet Take 4 mg by mouth every 8 (eight) hours as needed. For nausea.   Yes Historical Provider, MD  promethazine (PHENERGAN) 25 MG tablet Take 25 mg by mouth every 6 (six) hours as needed. For nausea.   Yes Historical Provider, MD  Pyridoxine HCl (VITAMIN B-6 PO) Take 1 tablet by mouth daily.   Yes Historical Provider, MD  vitamin B-12 (CYANOCOBALAMIN) 1000 MCG tablet Take 1,000 mcg by mouth daily.   Yes Historical Provider, MD   Allergies Allergies  Allergen Reactions  . Macrodantin Rash  . Nitrofurantoin     Family History Family History  Problem Relation Age of Onset  . Colon cancer Cousin     Paternal side  . Colon cancer Paternal Grandfather   . Lung cancer Mother   . Diabetes Father   . Heart disease Father   . Prostate cancer      grandfather  . Breast cancer Maternal Aunt    Social History  reports that she has quit smoking. She has never used smokeless tobacco. She reports that she drinks alcohol. She reports that she does not use illicit drugs.  Review Of Systems:    neg for  any significant sore throat, dysphagia, dental problems, itching, sneezing,  nasal congestion or excess/ purulent secretions, ear ache,   fever, chills, sweats, unintended wt loss, pleuritic or exertional cp, hemoptysis, palpitations, orthopnea pnd or leg swelling.  Also denies presyncope, palpitations,  diarrhea  or change in  bowel or urinary habits, change in stools or urine, dysuria,hematuria,  rash, arthralgias, visual complaints, headache, numbness weakness or ataxia or problems with walking or coordination. No noted change in mood/affect or memory.         Current Status: Sitting on side of bed nad  Vital Signs: Temp:  [99 F (37.2 C)-103 F (39.4 C)] 99 F (37.2 C) (06/20 0602) Pulse Rate:  [106-142] 106  (06/20 0800) Resp:  [9-29] 22  (06/20 1241) BP: (115-159)/(65-88) 116/70 mmHg (06/20 0602) SpO2:  [94 %-99 %] 96 % (06/20 1241) 2lpm  NP   Intake/Output Summary (Last 24 hours) at 01/30/12 1328 Last data filed at 01/30/12 1259  Gross per 24 hour  Intake   4520 ml  Output   4600 ml  Net    -80 ml    Physical Examination: General:uncomfortable but not toxic HEENT mucosa dry Neck supple no jvd Lungs clear to A and P abd mod distended, soft Ext Pos pas hose  Active Problems:  Acute pancreatitis  Hypokalemia  Bilateral pleural effusion   ASSESSMENT AND PLAN  PULMONARY No results found for this basename: PHART:5,PCO2:5,PCO2ART:5,PO2ART:5,HCO3:5,O2SAT:5 in the last 168 hours     CXR:  6/20 no convincing as dz or effusion    A:  02 dep resp failure/ bilateral effusions and atx/ no convincing pna P:   02/ abx per ID section adequate  CARDIOVASCULAR No results found for this basename: TROPONINI:5,LATICACIDVEN:5, O2SATVEN:5,PROBNP:5 in the last 168 hours    A: Low grade SIRS P: Monitor  RENAL  Lab 01/30/12 0300 01/29/12 0609 01/27/12 1040 01/27/12 0410 01/26/12 0445  NA 132* 135 139 139 138  K 3.6 4.0 -- -- --  CL 100 103 107 107 106  CO2 22 26 27 26 23   BUN 4* 4* 6 7 7   CREATININE 0.40* 0.36* 0.40* 0.40* 0.44*  CALCIUM 7.8* 7.8* 7.6* 8.0* 7.5*  MG -- 1.9 -- -- --  PHOS -- -- -- -- --   Intake/Output      06/19 0701 - 06/20 0700 06/20 0701 - 06/21 0700   P.O. 480 200   I.V. (mL/kg) 3450 (46.1) 1047.5 (14)   IV Piggyback  100   Total Intake(mL/kg) 3930 (52.5) 1347.5 (18)   Urine (mL/kg/hr) 6800 (3.8) 400 (0.8)   Total Output 6800 400   Net -2870 +947.5        Urine Occurrence 2 x    Stool Occurrence 2 x 1 x       A:  No issues P:   monitor  GASTROINTESTINAL  Lab 01/30/12 0300 01/26/12 0445 01/24/12 0439 01/23/12 1914  AST 67* 46* 466* 267*  ALT 61* 107* 405* 166*  ALKPHOS 210* 63 104 96  BILITOT 1.5* 0.7 0.7 1.2  PROT 5.8* 5.2* 6.5 6.3  ALBUMIN 2.4* 2.6* 3.9 3.8    A:  Acute pancreatitis s/p ercp 6/13 P:   Conservative rx per GI  HEMATOLOGIC  Lab 01/30/12 0300  01/27/12 0410 01/26/12 0445 01/24/12 0439 01/23/12 1914  HGB 10.9* 12.2 12.9 12.1 12.2  HCT 31.7* 36.2 38.6 35.3* 35.3*  PLT 186 262 236 288 263  INR -- -- -- -- --  APTT -- -- -- -- --   A  Mild anemia of critical illness P:  monitor  INFECTIOUS  Lab 01/30/12 0300 01/27/12 0410 01/26/12 0445 01/24/12 0439 01/23/12 1914  WBC 4.0 10.3 11.0* 11.2* 14.4*  PROCALCITON -- -- -- -- --   Cultures: BC x 2 6/20 >>  UC 6/20 >>  Antibiotics: Primaxin (per GI) 6/17 >>  A:  Pancreatitis/ sirs/ no evidence abd sepsis/ pna P:   abx per GI, not needed from our perspective at this point   check baseline PCT 6/20 >>>   ENDOCRINE No results found for this basename: GLUCAP:5 in the last 168 hours A:  No issues        BEST PRACTICE / DISPOSITION Level of Care:  floor Primary Service:  gi Consultants:  pccm Code Status:  full Diet:  Per GI DVT Px:  pas GI Px:  PPI Skin Integrity:  ok Social / Family:  At bedside  Sandrea Hughs, M.D. Pulmonary and Critical Care Medicine [facility] Pager: [phone]  01/30/2012, 1:22 PM

## 2012-01-30 NOTE — Progress Notes (Signed)
Called per Misha RN to bedside for Rapid response at 0300. Pt admitted for pancreatitis, having recurrent fevers for days. Tonight her fever is accompanied by elevated HR. Dr Leone Payor called per Misha prior to my arrival, orders placed for labs and [facility] X 2 for fever. Upon arrival to room pt placed on heart monitor, yielding rate in 140s, ST. Oral temp at 0300 was 103, no rectal temp available at this time. Cooling measures started, tylenol given last at 0238. Loel Dubonnet brought to room and ice bath given. Pt denies pain at this time, lung sounds clear, CXR done stat per MD order. Pt lethargic and mildly confused, per Misha her confusion has improved since last night night due to a reduced dose dilaudid. Pt easily reoriented and follows commands. Dr Leone Payor called back at 0350 to update on pt changes and lab results, no orders at this time, per MD infection disease consult to be done today. Misha RN encouraged to call RRT for further changes, Pt left in bed resting quietly. See VS below.   0310 140 ST 94% 3 L [place] 119/63 22 RR  0330 99.5 oral ? 133 ST 95 % 105/62 23 RR  0340 102.2 rectal 128 ST 96 %  21  0400 101 Rectal 117 94% 115/65 18 rr

## 2012-01-31 ENCOUNTER — Inpatient Hospital Stay (HOSPITAL_COMMUNITY): Payer: 59

## 2012-01-31 DIAGNOSIS — K859 Acute pancreatitis without necrosis or infection, unspecified: Secondary | ICD-10-CM

## 2012-01-31 LAB — CBC
Hemoglobin: 10.2 g/dL — ABNORMAL LOW (ref 12.0–15.0)
MCH: 30.1 pg (ref 26.0–34.0)
MCV: 87.3 fL (ref 78.0–100.0)
Platelets: 269 10*3/uL (ref 150–400)
RBC: 3.39 MIL/uL — ABNORMAL LOW (ref 3.87–5.11)
WBC: 23.4 10*3/uL — ABNORMAL HIGH (ref 4.0–10.5)

## 2012-01-31 LAB — HEPATIC FUNCTION PANEL
ALT: 85 U/L — ABNORMAL HIGH (ref 0–35)
AST: 92 U/L — ABNORMAL HIGH (ref 0–37)
Alkaline Phosphatase: 129 U/L — ABNORMAL HIGH (ref 39–117)
Indirect Bilirubin: 0.3 mg/dL (ref 0.3–0.9)
Total Protein: 5.4 g/dL — ABNORMAL LOW (ref 6.0–8.3)

## 2012-01-31 LAB — URINE CULTURE
Culture  Setup Time: 201306200844
Special Requests: NORMAL

## 2012-01-31 LAB — BASIC METABOLIC PANEL
CO2: 24 mEq/L (ref 19–32)
Chloride: 106 mEq/L (ref 96–112)
GFR calc Af Amer: >90 mL/min (ref >90)
Sodium: 136 mEq/L (ref 135–145)

## 2012-01-31 MED ORDER — CHLORHEXIDINE GLUCONATE 0.12 % MT SOLN
15.0000 mL | Freq: Two times a day (BID) | OROMUCOSAL | Status: DC
  Administered 2012-01-31 – 2012-03-03 (×58): 15 mL via OROMUCOSAL
  Filled 2012-01-31 (×68): qty 15

## 2012-01-31 MED ORDER — MEPERIDINE HCL 50 MG/ML IJ SOLN
25.0000 mg | Freq: Once | INTRAMUSCULAR | Status: AC
  Administered 2012-01-31: 25 mg via INTRAVENOUS
  Filled 2012-01-31: qty 1

## 2012-01-31 MED ORDER — MEPERIDINE HCL 50 MG/ML IJ SOLN
25.0000 mg | INTRAMUSCULAR | Status: DC | PRN
  Administered 2012-02-01 – 2012-02-02 (×2): 25 mg via INTRAVENOUS
  Filled 2012-01-31 (×2): qty 1

## 2012-01-31 MED ORDER — BIOTENE DRY MOUTH MT LIQD
15.0000 mL | Freq: Two times a day (BID) | OROMUCOSAL | Status: DC
  Administered 2012-01-31 – 2012-02-11 (×20): 15 mL via OROMUCOSAL

## 2012-01-31 MED ORDER — SODIUM CHLORIDE 0.9 % IV BOLUS (SEPSIS)
1000.0000 mL | Freq: Once | INTRAVENOUS | Status: AC
  Administered 2012-01-31: 1000 mL via INTRAVENOUS

## 2012-01-31 MED ORDER — MEPERIDINE HCL 50 MG/ML IJ SOLN: 50.0000 mg | Freq: Once | INTRAMUSCULAR | Status: DC

## 2012-01-31 NOTE — Progress Notes (Signed)
[facility] Progress Note  SUBJECTIVE: Chills and "same" abdominal pain earlier. No chest pain or dyspnea.  Had high temp/fever and rigors again this AM, mild desaturation, slight hallucinations, dyspnea - rapid response team called for this Feeling better after that and 25 mg meperidine  OBJECTIVE:  Vital signs in last 24 hours: Temp:  [98.5 F (36.9 C)-103 F (39.4 C)] 103 F (39.4 C) (06/21 0531) Pulse Rate:  [105-123] 123  (06/21 0531) Resp:  [16-32] 32  (06/21 0823) BP: (109-137)/(65-86) 137/86 mmHg (06/21 0531) SpO2:  [95 %-100 %] 95 % (06/21 0823) FiO2 (%):  [31 %-33 %] 31 % (06/21 0448) Last BM Date: 01/30/12 General:    white female resting in bed not in any immediate distress. Rapid response team finishing up.  Heart:  Tachycardic, HR 123.  Lungs: Tachypnea. Right lung fields CTA. Inadequate auscultation of LLL at present with ice packs, positioning Abdomen:  Soft, mildly distended with a few bowel sounds. Moderate diffuse upper abdominal tenderness. . Extremities:  Without edema. Neurologic:  Alert and oriented but occasionally makes unrelated comments.  Psych:  Cooperative. Normal mood and affect.  Intake/Output from previous day: 06/20 0701 - 06/21 0700 In: 2837.5 [P.O.:820; I.V.:1917.5; IV Piggyback:100] Out: 3900 [Urine:3900]     Lab Results:  Basename 01/30/12 0300  WBC 4.0  HGB 10.9*  HCT 31.7*  PLT 186   BMET  Basename 01/31/12 0455 01/30/12 0300 01/29/12 0609  NA 136 132* 135  K 3.9 3.6 4.0  CL 106 100 103  CO2 24 22 26   GLUCOSE 137* 109* 129*  BUN 4* 4* 4*  CREATININE 0.41* 0.40* 0.36*  CALCIUM 7.7* 7.8* 7.8*   LFT  Basename 01/30/12 0300  PROT 5.8*  ALBUMIN 2.4*  AST 67*  ALT 61*  ALKPHOS 210*  BILITOT 1.5*  BILIDIR --  IBILI --       ASSESSMENT / PLAN:  1. Severe post-ERCP pancreatitis with SIRS. Repeat CTscan 2 days ago showed slight worsening of severe pancreatitis with extensive retroperitoneal inflammation  and ill-defined fluid. No necrosis, increased ascites and pleural effusions. She has continued to spike fevers. This am had shaking chills, temp 103. Her 02 sats were declining on 2liters of oxygen, HR was in 150's. Rapid Response was called. She spiked temp of 104.1. O2 increased to 4L, sats better. Pulmonary didn't feel fevers related to a pulmonary process, appreciate their help. I called Infectious Disease this am. Dr. Luciana Axe will come to see her. Stat CBC pending. LFTS pending, blood culture pending. Will obtain diagnostic paracentesis with cell count, gram stain, culture, amylase, and albumin. Continue ice packs, tylenol for fever, IVF.  Patient will be transferred to the unit for close monitoring.   2. Hyponatremia, resolved.   3. Fevers/rigors- ? Cause - pancreatitis, peritonitis (SBP like?) drugs? - it was not Dilaudid.   LOS: 8 days   [name]  01/31/2012, 8:55 AM   [facility] Attending  I have also seen and assessed the patient and agree with the above note. Seen at time of above note and later. Is improved and alert. No new findings. Await studies and ID consult and will follow-up.   Iva Boop, MD, [facility] [phone] (pager) 01/31/2012 10:26 AM

## 2012-01-31 NOTE — Progress Notes (Addendum)
Started to have some rigors again with temp elevation.  Responded well to demerol 25 mg  Her abd is minimally tender, she is having bowel sounds and defecation so will try full liquids low fat tonight.  If that goes well try to advance if not would place nasoenteric tube and use liquid nutrition.  Prn demerol for rigors, await cx results.  Not enough ascites to tap.  She has been negative on fluid balance - IV NS bolus x 1 L tonight  Iva Boop, MD, [facility] [phone] (pager) 01/31/2012 5:09 PM

## 2012-01-31 NOTE — Significant Event (Signed)
Rapid Response Event Note  Overview: Time Called: 0815 Arrival Time: 0820 Event Type: Other (Comment) (fever chills)  Initial Focused Assessment:  Pt shivering, c/o being cold.   Interventions:Pt alert and oriented x3, but conversation inappropriate intermittently, rectal temp obtained 104.1, MD notified, RN at bedside, O2 increased to 4L, denies CP, cardiac moniter applied.   Event Summary: sats increase to  95% on 4L, HR ST 130, RR 29, NP and MD in to assess PT, see orders and RN record of event, ice packs applied. Transfer to SD.   at      at          [facility], Ferd Hibbs

## 2012-01-31 NOTE — Progress Notes (Signed)
This nurse called in to room to check on patient. Patient shivering with c/o being cold. Patient request Ativan. Vital signs taken @0810 --103.7-152-32 BP152/85.  Rapid response nurse called for assist. @0820  rapid response nurse at bedside & request a rectal temp for accuracy. Temp obtained 104.1 rectally. Patient received Tylenol 1000mg  po @ 0544 & Ativan 0.5mg  IV @0530 .  Lucrezia Europe PA called. T.O.V. For CBC & Demerol 25mg  IV x1 dose. This Demerol given @ 0838 via PICC in rt arm. Ice packs applied to back & abd. CBC & blood cultures drawn per phlebotomist @ 0845. Dr. Leone Payor & Myer Haff PA in to see patient. Myer Haff PA questioned when PICC inserted. PICC inserted Sunday. Site unremarkable. Rapid response nurse still @ bedside & accompanied with transfer of patient to stepdown unit room #1226. Patient alert & oriented. Voided on bedpan prior transfer. Urine output 400cc clear yellow urine without odor noted. Dr. Leone Payor to contact husband. Face-to-face report given to nurse Erin upon transfer to stepdown.

## 2012-01-31 NOTE — Progress Notes (Addendum)
Name: Cassie Berry MRN: [id number] DOB: [birth year]    LOS: 8  Referring Provider:  Leone Payor Reason for Referral:  ? HCAP  PULMONARY / CRITICAL CARE MEDICINE  PROFILE:  26 yowf s/p ERCP with sphincterotomy 6/13 complicated by pancreatitis, persistent fever and bilateral effusion on CT scan 6/19 and cxr 6/20. Concern for PNA raised and PCCM asked to eval 6/20    SUBJ: Transfer to ICU 6/21 AM by GI service for high fever with shaking chills. Presently no distress  Vital Signs: Temp:  [98.5 F (36.9 C)-104.1 F (40.1 C)] 104.1 F (40.1 C) (06/21 0820) Pulse Rate:  [105-152] 131  (06/21 0930) Resp:  [16-34] 24  (06/21 0930) BP: (105-143)/(60-88) 105/68 mmHg (06/21 0930) SpO2:  [92 %-100 %] 92 % (06/21 0930) FiO2 (%):  [31 %-33 %] 31 % (06/21 0448) 2lpm NP   Intake/Output Summary (Last 24 hours) at 01/31/12 1152 Last data filed at 01/31/12 1038  Gross per 24 hour  Intake 2087.5 ml  Output   3800 ml  Net -1712.5 ml    Physical Examination: General: NAD HEENT WNL Neck supple no jvd Lungs clear to A and P abd mildly distended, soft, mildly tender Ext No edema  BMET    Component Value Date/Time   NA 136 01/31/2012 0455   K 3.9 01/31/2012 0455   CL 106 01/31/2012 0455   CO2 24 01/31/2012 0455   GLUCOSE 137* 01/31/2012 0455   BUN 4* 01/31/2012 0455   CREATININE 0.41* 01/31/2012 0455   CALCIUM 7.7* 01/31/2012 0455   GFRNONAA >90 01/31/2012 0455   GFRAA >90 01/31/2012 0455    CBC    Component Value Date/Time   WBC 23.4* 01/31/2012 1045   RBC 3.39* 01/31/2012 1045   HGB 10.2* 01/31/2012 1045   HCT 29.6* 01/31/2012 1045   PLT 269 01/31/2012 1045   MCV 87.3 01/31/2012 1045   MCH 30.1 01/31/2012 1045   MCHC 34.5 01/31/2012 1045   RDW 14.6 01/31/2012 1045   LYMPHSABS 0.3* 01/30/2012 0300   MONOABS 0.0* 01/30/2012 0300   EOSABS 0.3 01/30/2012 0300   BASOSABS 0.0 01/30/2012 0300    CXR: no new film    Lines, Tubes, etc: RUE PICC 6/18 >>   Microbiology: MRSA PCR 6/21 >>  neg PCT 6/20: 25.12 Urine 6/20 >> neg Blood X 2 6/20 >>  Blood X 2 6/21 >>  Paracentesis 6/21 >>   Antibiotics:  Imipenem 6/19 >>    Studies/Events: CT abd/pelvis 6/19 >> Slight worsening of severe pancreatitis with extensive retroperitoneal inflammation and ill-defined fluid. No drainable fluid collection identified.  No evidence of pancreatic hemorrhage or necrosis. Slightly increased ascites, pleural effusions and flank edema.   Consults:  GI - primary   Best Practice: DVT: SCDs SUP: PPI Nutrition: none Glycemic control: none Sedation/analgesia: PCA   IMPRESSION/PLAN: Patient Active Hospital Problem List: 1) Acute pancreatitis (01/23/2012)   Assessment: s/p ERCP   Plan: mgmt per GI. Will need to address nutrition in next day or two. Consider TPN if POs not an option  2) Bilateral pleural effusion (01/30/2012)   Assessment: L>R, Likely related to severe pancreatitis, doubt PNA   Plan: Cont supportive care  3) Fever (01/30/2012)   Assessment: Suspect due to abdominal source - pancreatitis vs cholangitis vs peritonitis   Plan: Cont imipenem. Paracentesis planned today. F/U cx results  4) Hyponatremia (01/30/2012)   Assessment: resolved   Plan: Cont IVFs. Monitor chemistries   PCCM will see PRN this WE. Please call  if needed  Billy Fischer, MD ; [facility] [phone].  After 5:30 PM or weekends, call [phone]

## 2012-01-31 NOTE — Consult Note (Signed)
Infectious Diseases Initial Consultation  Reason for Consultation:  Fever   HPI: Cassie Berry is a 46 y.o. 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  Has had significant abdominal pain and CT with severe pancreatitis, worsening on repeat CT.  She describes fever, rigors and pain with po intake.  No SOB, no rashes, minimal loose stools.  Rapid response had been called with tachycardia, confusion.  Blood cultures have been sent.  CXR with some effusion but she has not been hypoxic.  + ascites.    Past Medical History  Diagnosis Date  . GERD (gastroesophageal reflux disease)   . Anxiety   . Migraine headache   . PONV (postoperative nausea and vomiting)   . Pneumonia     02/2011    Allergies:  Allergies  Allergen Reactions  . Macrodantin Rash  . Nitrofurantoin     Current antibiotics:   MEDICATIONS:    . acetaminophen  975 mg Rectal Once  . buPROPion  300 mg Oral Daily  . imipenem-cilastatin  500 mg Intravenous Q6H  . LORazepam      . meperidine (DEMEROL) injection  25 mg Intravenous Once  . morphine   Intravenous Q4H  . ondansetron (ZOFRAN) IV  4 mg Intravenous Q12H  . pantoprazole (PROTONIX) IV  40 mg Intravenous Q1200  . sodium chloride  10-40 mL Intracatheter Q12H  . DISCONTD: HYDROmorphone PCA 0.3 mg/mL   Intravenous Q4H    History  Substance Use Topics  . Smoking status: Former Games developer  . Smokeless tobacco: Never Used  . Alcohol Use: Yes     Occasional    Family History  Problem Relation Age of Onset  . Colon cancer Cousin     Paternal side  . Colon cancer Paternal Grandfather   . Lung cancer Mother   . Diabetes Father   . Heart disease Father   . Prostate cancer      grandfather  . Breast cancer Maternal Aunt     Review of Systems - Negative except as per HPI.  OBJECTIVE: Temp:  [98.5 F (36.9 C)-103 F (39.4 C)] 103 F (39.4 C) (06/21 0531) Pulse Rate:   [105-131] 131  (06/21 0930) Resp:  [16-34] 24  (06/21 0930) BP: (105-137)/(60-86) 105/68 mmHg (06/21 0930) SpO2:  [92 %-100 %] 92 % (06/21 0930) FiO2 (%):  [31 %-33 %] 31 % (06/21 0448) General appearance: alert, cooperative and mild distress Neck: + bilateral ant cervical lad, < 1/2 cm; no oropharyngeal lesions Resp: clear to auscultation bilaterally Cardio: regular rate and rhythm, S1, S2 normal, no murmur, click, rub or gallop GI: soft, no guarding, + bs, mild tenderness diffusely Extremities: extremities normal, atraumatic, no cyanosis or edema tachycardic LABS: Results for orders placed during the hospital encounter of 01/23/12 (from the past 48 hour(s))  CULTURE, BLOOD (ROUTINE X 2)     Status: Normal (Preliminary result)   Collection Time   01/30/12  3:00 AM      Component Value Range Comment   Specimen Description BLOOD LEFT ANTECUBITAL      Special Requests BOTTLES DRAWN AEROBIC AND ANAEROBIC      Culture  Setup Time [id number]      Culture        Value:        BLOOD CULTURE RECEIVED NO GROWTH TO DATE CULTURE WILL BE HELD FOR 5 DAYS BEFORE ISSUING A FINAL NEGATIVE REPORT   Report Status PENDING  CBC     Status: Abnormal   Collection Time   01/30/12  3:00 AM      Component Value Range Comment   WBC 4.0  4.0 - 10.5 K/uL    RBC 3.59 (*) 3.87 - 5.11 MIL/uL    Hemoglobin 10.9 (*) 12.0 - 15.0 g/dL    HCT 16.1 (*) 09.6 - 46.0 %    MCV 88.3  78.0 - 100.0 fL    MCH 30.4  26.0 - 34.0 pg    MCHC 34.4  30.0 - 36.0 g/dL    RDW 04.5  40.9 - 81.1 %    Platelets 186  150 - 400 K/uL   DIFFERENTIAL     Status: Abnormal   Collection Time   01/30/12  3:00 AM      Component Value Range Comment   Neutrophils Relative 85 (*) 43 - 77 %    Lymphocytes Relative 7 (*) 12 - 46 %    Monocytes Relative 1 (*) 3 - 12 %    Eosinophils Relative 7 (*) 0 - 5 %    Basophils Relative 0  0 - 1 %    Neutro Abs 3.4  1.7 - 7.7 K/uL    Lymphs Abs 0.3 (*) 0.7 - 4.0 K/uL    Monocytes Absolute 0.0  (*) 0.1 - 1.0 K/uL    Eosinophils Absolute 0.3  0.0 - 0.7 K/uL    Basophils Absolute 0.0  0.0 - 0.1 K/uL    RBC Morphology RARE NRBCs      WBC Morphology TOXIC GRANULATION      Smear Review LARGE PLATELETS PRESENT     LIPASE, BLOOD     Status: Abnormal   Collection Time   01/30/12  3:00 AM      Component Value Range Comment   Lipase 60 (*) 11 - 59 U/L   AMYLASE     Status: Normal   Collection Time   01/30/12  3:00 AM      Component Value Range Comment   Amylase 101  0 - 105 U/L   COMPREHENSIVE METABOLIC PANEL     Status: Abnormal   Collection Time   01/30/12  3:00 AM      Component Value Range Comment   Sodium 132 (*) 135 - 145 mEq/L    Potassium 3.6  3.5 - 5.1 mEq/L    Chloride 100  96 - 112 mEq/L    CO2 22  19 - 32 mEq/L    Glucose, Bld 109 (*) 70 - 99 mg/dL    BUN 4 (*) 6 - 23 mg/dL    Creatinine, Ser 9.14 (*) 0.50 - 1.10 mg/dL    Calcium 7.8 (*) 8.4 - 10.5 mg/dL    Total Protein 5.8 (*) 6.0 - 8.3 g/dL    Albumin 2.4 (*) 3.5 - 5.2 g/dL    AST 67 (*) 0 - 37 U/L    ALT 61 (*) 0 - 35 U/L    Alkaline Phosphatase 210 (*) 39 - 117 U/L    Total Bilirubin 1.5 (*) 0.3 - 1.2 mg/dL    GFR calc non Af Amer >90  >90 mL/min    GFR calc Af Amer >90  >90 mL/min   CULTURE, BLOOD (ROUTINE X 2)     Status: Normal (Preliminary result)   Collection Time   01/30/12  3:05 AM      Component Value Range Comment   Specimen Description BLOOD L HAND      Special  Requests Normal BOTTLES DRAWN AEROBIC AND ANAEROBIC      Culture  Setup Time [id number]      Culture        Value:        BLOOD CULTURE RECEIVED NO GROWTH TO DATE CULTURE WILL BE HELD FOR 5 DAYS BEFORE ISSUING A FINAL NEGATIVE REPORT   Report Status PENDING     URINALYSIS, ROUTINE W REFLEX MICROSCOPIC     Status: Abnormal   Collection Time   01/30/12  6:35 AM      Component Value Range Comment   Color, Urine YELLOW  YELLOW    APPearance CLEAR  CLEAR    Specific Gravity, Urine 1.010  1.005 - 1.030    pH 7.0  5.0 - 8.0    Glucose,  UA NEGATIVE  NEGATIVE mg/dL    Hgb urine dipstick NEGATIVE  NEGATIVE    Bilirubin Urine NEGATIVE  NEGATIVE    Ketones, ur NEGATIVE  NEGATIVE mg/dL    Protein, ur NEGATIVE  NEGATIVE mg/dL    Urobilinogen, UA 2.0 (*) 0.0 - 1.0 mg/dL    Nitrite NEGATIVE  NEGATIVE    Leukocytes, UA NEGATIVE  NEGATIVE MICROSCOPIC NOT DONE ON URINES WITH NEGATIVE PROTEIN, BLOOD, LEUKOCYTES, NITRITE, OR GLUCOSE <1000 mg/dL.  URINE CULTURE     Status: Normal   Collection Time   01/30/12  6:35 AM      Component Value Range Comment   Specimen Description URINE, CLEAN CATCH      Special Requests Imipenem Normal      Culture  Setup Time [id number]      Colony Count NO GROWTH      Culture NO GROWTH      Report Status 01/31/2012 FINAL     PROCALCITONIN     Status: Normal   Collection Time   01/30/12  3:20 PM      Component Value Range Comment   Procalcitonin 25.12     BASIC METABOLIC PANEL     Status: Abnormal   Collection Time   01/31/12  4:55 AM      Component Value Range Comment   Sodium 136  135 - 145 mEq/L    Potassium 3.9  3.5 - 5.1 mEq/L    Chloride 106  96 - 112 mEq/L    CO2 24  19 - 32 mEq/L    Glucose, Bld 137 (*) 70 - 99 mg/dL    BUN 4 (*) 6 - 23 mg/dL    Creatinine, Ser 1.49 (*) 0.50 - 1.10 mg/dL    Calcium 7.7 (*) 8.4 - 10.5 mg/dL    GFR calc non Af Amer >90  >90 mL/min    GFR calc Af Amer >90  >90 mL/min   HEPATIC FUNCTION PANEL     Status: Abnormal   Collection Time   01/31/12  4:55 AM      Component Value Range Comment   Total Protein 5.4 (*) 6.0 - 8.3 g/dL    Albumin 2.2 (*) 3.5 - 5.2 g/dL    AST 92 (*) 0 - 37 U/L    ALT 85 (*) 0 - 35 U/L    Alkaline Phosphatase 129 (*) 39 - 117 U/L    Total Bilirubin 0.5  0.3 - 1.2 mg/dL    Bilirubin, Direct 0.2  0.0 - 0.3 mg/dL    Indirect Bilirubin 0.3  0.3 - 0.9 mg/dL     MICRO:  IMAGING: Ct Abdomen Pelvis W Wo Contrast  01/29/2012  *RADIOLOGY REPORT*  Clinical Data: Abdominal pain with fever.  Follow up pancreatitis. Evaluate for necrosis.   CT ABDOMEN AND PELVIS WITHOUT AND WITH CONTRAST  Technique:  Multidetector CT imaging of the abdomen and pelvis was performed without contrast material in one or both body regions, followed by contrast material(s) and further sections in one or both body regions.  Contrast: OMNIPAQUE IOHEXOL 300 MG/ML  SOLN  Comparison: abdominal pelvic CT 01/25/2012.  Findings: There are enlarging bilateral pleural effusions. Associated bibasilar atelectasis has not significantly changed.  The biliary stent has been removed in the interval.  The pancreas remains diffusely edematous with homogeneous enlargement. The precontrast images demonstrate no evidence of acute hemorrhage within the pancreas. Postcontrast, the pancreas enhances homogeneously.  There is no evidence of pancreatic necrosis.  The extensive peripancreatic inflammatory changes and ill-defined fluid have mildly progressed.  There is no focal or drainable fluid collection.  There is apparent mild extrinsic mass effect on the superior mesenteric vein (best seen on coronal image 39 of series 602). No intravascular thrombus is seen.  Fluid surrounds the duodenum, hepatic flexure of the colon and extends inferiorly along the right pericolic gutter. The perihepatic component of the ascites has slightly decreased.  The cholecystectomy bed has a stable appearance.  There is no significant biliary dilatation.  The liver, spleen, adrenal glands and kidneys appear normal.  There is no evidence of bowel obstruction or focal bowel wall thickening.  A moderate amount of free pelvic fluid appears stable. There is some air within the lumen of the urinary bladder which is presumably iatrogenic.  No Foley catheter is in place.  The uterus and ovaries appear stable.  There is increased flank edema, asymmetric to the left.  IMPRESSION:  1.  Slight worsening of severe pancreatitis with extensive retroperitoneal inflammation and ill-defined fluid.  No drainable fluid collection  identified. 2.  No evidence of pancreatic hemorrhage or necrosis. 3.  Slightly increased ascites, pleural effusions and flank edema.  Original Report Authenticated By: Gerrianne Scale, M.D.   Dg Chest 2 View  01/30/2012  *RADIOLOGY REPORT*  Clinical Data: Fever and dyspnea.  Follow-up left pleural effusion  CHEST - 2 VIEW  Comparison: 01/30/2012 at 0325 hours and 06/04/2011  Findings: A right peripheral central venous catheter is stable in position.  Heart size appears within normal limits. Prominence of the hilar regions bilaterally is apparent but is felt likely due to projection combined with low lung volumes and can be reassessed once the patient is able to cooperate with an improved inspiration.  There is a persistent left pleural effusion with associated lingular and left lower lobe infiltrate.   Some prominence of the interstitial markings with interstitial septal lines and fissural fluid is compatible with associated interstitial edema. This appearance has improved since the previous exam.  Given the history of fever and dyspnea, left lower lobe pneumonia is not excluded.  IMPRESSION: Lingular and left lower lobe infiltrate with associated left pleural effusion worrisome for bronchopneumonia given the history of fever.  Improving interstitial edema pattern.  Original Report Authenticated By: Bertha Stakes, M.D.   Dg Chest Port 1 View  01/30/2012  *RADIOLOGY REPORT*  Clinical Data: Fever and dyspnea.  PORTABLE CHEST - 1 VIEW  Comparison: Chest radiograph performed 01/28/2012  Findings: The right PICC is noted ending overlying the proximal SVC.  The lungs are hypoexpanded.  Vascular congestion is noted, with bilateral central and left basilar airspace opacities, likely reflecting mildly worsened pulmonary edema.  A small left  pleural effusion is seen.  No pneumothorax is identified.  The cardiomediastinal silhouette is within normal limits.  No acute osseous abnormalities are seen.  IMPRESSION:  Lungs hypoexpanded; vascular congestion, with bilateral central and left basilar airspace opacities, likely reflecting mildly worsened pulmonary edema.  Small left pleural effusion seen.  Original Report Authenticated By: Tonia Ghent, M.D.    HISTORICAL MICRO/IMAGING  Assessment/Plan:  46 yo with severe post ERCP pancreatitis.  1) pancreatitis - on empiric imipenem.  I agree with ruling out SBP with a paracentesis.  No evidence of necrosis.  No other source for her fever and SIRS.  I do not suspect pneumonia with no hypoxia.  Blood cultures pending.  No significant diarrhea to suggest C diff.  -continue imipenem for now pending paracentesis, blood cultures.    Thanks for the consult.

## 2012-02-01 DIAGNOSIS — D649 Anemia, unspecified: Secondary | ICD-10-CM

## 2012-02-01 LAB — CBC
HCT: 27.1 % — ABNORMAL LOW (ref 36.0–46.0)
Hemoglobin: 9.5 g/dL — ABNORMAL LOW (ref 12.0–15.0)
MCHC: 35.1 g/dL (ref 30.0–36.0)
MCV: 86.9 fL (ref 78.0–100.0)
RDW: 14.8 % (ref 11.5–15.5)

## 2012-02-01 MED ORDER — MAGIC MOUTHWASH
5.0000 mL | Freq: Three times a day (TID) | ORAL | Status: DC
  Administered 2012-02-01 – 2012-02-12 (×19): 5 mL via ORAL
  Filled 2012-02-01 (×38): qty 5

## 2012-02-01 NOTE — Progress Notes (Signed)
Name: Cassie Berry MRN: [id number] DOB: [birth year]    LOS: 9  Referring Provider:  Leone Payor Reason for Referral:  ? [facility]  PULMONARY / CRITICAL CARE MEDICINE  PROFILE:  70 yowf former smoker  s/p ERCP with sphincterotomy 6/13 complicated by pancreatitis, persistent fever and bilateral effusion on CT scan 6/19 and cxr 6/20. Concern for PNA raised and PCCM asked to eval 6/20    SUBJ: No distress but very tachycardic/ tachypneic with rigors, better p demerol  Vital Signs: Temp:  [98.6 F (37 C)-104.1 F (40.1 C)] 102.4 F (39.1 C) (06/22 0748) Pulse Rate:  [94-152] 108  (06/22 0748) Resp:  [11-35] 11  (06/22 0748) BP: (104-147)/(57-92) 132/79 mmHg (06/22 0748) SpO2:  [92 %-100 %] 99 % (06/22 0600) FiO2 (%):  [96 %] 96 % (06/21 2032) Weight:  [166 lb 0.1 oz (75.3 kg)] 166 lb 0.1 oz (75.3 kg) (06/22 0000) 2lpm NP   Intake/Output Summary (Last 24 hours) at 02/01/12 0807 Last data filed at 02/01/12 0800  Gross per 24 hour  Intake 4471.9 ml  Output   4150 ml  Net  321.9 ml    Physical Examination: General: NAD HEENT WNL Neck supple no jvd Lungs clear to A and P abd mildly distended, soft, mildly tender Ext No edema   CXR: no new film    Lines, Tubes, etc: RUE PICC 6/18 >>   Microbiology: MRSA PCR 6/21 >> neg PCT 6/20: 25.12 Urine 6/20 >> neg Blood X 2 6/20 >>  Blood X 2 6/21 >>     Antibiotics:  Imipenem 6/19 >>    Studies/Events: CT abd/pelvis 6/19 >> Slight worsening of severe pancreatitis with extensive retroperitoneal inflammation and ill-defined fluid. No drainable fluid collection identified.  No evidence of pancreatic hemorrhage or necrosis. Slightly increased ascites, pleural effusions and flank edema. U/s 6/21 >Minimal ascites without a significant fluid collection to perform a  paracentesis.    Consults:  GI - primary   Best Practice: DVT: SCDs SUP: PPI Nutrition: per GI Glycemic control: none Sedation/analgesia: PCA    Lab  01/31/12 0455 01/30/12 0300 01/29/12 0609  NA 136 132* 135  K 3.9 3.6 4.0  CL 106 100 103  CO2 24 22 26   BUN 4* 4* 4*  CREATININE 0.41* 0.40* 0.36*  GLUCOSE 137* 109* 129*    Lab 02/01/12 0501 01/31/12 1045 01/30/12 0300  HGB 9.5* 10.2* 10.9*  HCT 27.1* 29.6* 31.7*  WBC 17.5* 23.4* 4.0  PLT 300 269 186        IMPRESSION/PLAN: Patient Active Hospital Problem List: 1) Acute pancreatitis (01/23/2012)   Assessment: s/p ERCP   Plan: mgmt per GI.    2) Bilateral pleural effusion (01/30/2012)   Assessment: L>R, Likely related to severe pancreatitis, doubt PNA   Plan: Cont supportive care  3) Fever (01/30/2012)   Assessment: Suspect due to abdominal source - pancreatitis vs cholangitis vs peritonitis   Plan: Cont imipenem. Paracentesis planned today. F/U cx results  4) Hyponatremia (01/30/2012)   Assessment: resolved   Plan: Cont IVFs. Monitor chemistries  5) Anemia,  Probably related to acute illness  Between rigors she looks great Will check Procalcitonin in am to establish multiple points on curve No change rx needed x change to stepdown status at this point  Sandrea Hughs, MD Pulmonary and Critical Care Medicine [facility] Cell [phone]

## 2012-02-01 NOTE — Progress Notes (Signed)
Progress note and ID f/u Probable pancreatitis and sepsis   Fevers down today and so far blood cultures remain sterile. Initial WBC on admission was 4.0K consistent with acute septic response but now "rebounding" to 17K. Abd mildly tender. Assessment: Sepsis from pancreatitis with possible bacterial translocation and/or bacteremia Reccomendations: Continue imipenem pending cultures and further resolution. May not be sufficient ascites to tap. Lina Sayre

## 2012-02-01 NOTE — Progress Notes (Signed)
Subjective: Cross cover [facility] Patient seems to be doing somewhat better today. Has not had much to eat and has been having minimal nausea with mild abdominal pain. On Imipemen with negative cultures so far.   Objective: Vital signs in last 24 hours: Temp:  [98.9 F (37.2 C)-102.4 F (39.1 C)] 101 F (38.3 C) (06/22 1600) Pulse Rate:  [94-114] 95  (06/22 1139) Resp:  [11-35] 35  (06/22 1600) BP: (104-132)/(62-79) 123/70 mmHg (06/22 1139) SpO2:  [95 %-99 %] 97 % (06/22 1600) FiO2 (%):  [96 %-98 %] 98 % (06/22 1155) Weight:  [75.3 kg (166 lb 0.1 oz)] 75.3 kg (166 lb 0.1 oz) (06/22 0000) Last BM Date: 01/30/12  Intake/Output from previous day: 06/21 0701 - 06/22 0700 In: 4621.9 [P.O.:360; I.V.:2850; IV Piggyback:1411.9] Out: 3650 [Urine:3650] Intake/Output this shift: Total I/O In: 1275 [P.O.:480; I.V.:685; IV Piggyback:110] Out: 1750 [Urine:1750]  General appearance: alert, cooperative, appears stated age, fatigued and mild distress Resp: clear to auscultation bilaterally Cardio: regular rate and rhythm, S1, S2 normal, no murmur, click, rub or gallop GI: soft with epigastric tenderness on palpation with gaurding but without rebound or rigidity; bowel sounds normal; no masses,  no organomegaly Extremities: extremities normal, atraumatic, no cyanosis or edema  Lab Results:  Basename 02/01/12 0501 01/31/12 1045 01/30/12 0300  WBC 17.5* 23.4* 4.0  HGB 9.5* 10.2* 10.9*  HCT 27.1* 29.6* 31.7*  PLT 300 269 186   BMET  Basename 01/31/12 0455 01/30/12 0300  NA 136 132*  K 3.9 3.6  CL 106 100  CO2 24 22  GLUCOSE 137* 109*  BUN 4* 4*  CREATININE 0.41* 0.40*  CALCIUM 7.7* 7.8*   LFT  Basename 01/31/12 0455  PROT 5.4*  ALBUMIN 2.2*  AST 92*  ALT 85*  ALKPHOS 129*  BILITOT 0.5  BILIDIR 0.2  IBILI 0.3    Studies/Results: US Abdomen Limited  01/31/2012  *RADIOLOGY REPORT*  Clinical Data: Fever.  Severe pancreatitis.  LIMITED ABDOMEN ULTRASOUND FOR ASCITES  Technique:   Limited ultrasound survey for ascites was performed in all four abdominal quadrants.  Comparison:  CT abdomen pelvis 01/29/2012.  Findings: Minimal abdominal ascites is present.  There is no significant fluid collection to perform paracentesis.  IMPRESSION: Minimal ascites without a significant fluid collection to perform a paracentesis.  Original Report Authenticated By: Jamesetta Orleans. MATTERN, M.D.    Medications: I have reviewed the patient's current medications.  Assessment/Plan: Acute post-ERCP pancreatitis with fevers: improving slowly. Continue present care.   LOS: 9 days   Elga Santy 02/01/2012, 5:09 PM

## 2012-02-02 LAB — CBC
HCT: 29.1 % — ABNORMAL LOW (ref 36.0–46.0)
MCH: 29.9 pg (ref 26.0–34.0)
MCV: 86.9 fL (ref 78.0–100.0)
RBC: 3.35 MIL/uL — ABNORMAL LOW (ref 3.87–5.11)
WBC: 18.9 10*3/uL — ABNORMAL HIGH (ref 4.0–10.5)

## 2012-02-02 MED ORDER — LORATADINE 10 MG PO TABS
10.0000 mg | ORAL_TABLET | Freq: Every day | ORAL | Status: DC
  Administered 2012-02-02 – 2012-03-03 (×29): 10 mg via ORAL
  Filled 2012-02-02 (×31): qty 1

## 2012-02-02 MED ORDER — ONDANSETRON HCL 4 MG PO TABS
4.0000 mg | ORAL_TABLET | Freq: Three times a day (TID) | ORAL | Status: DC | PRN
  Administered 2012-02-05 – 2012-02-14 (×4): 4 mg via ORAL
  Filled 2012-02-02 (×5): qty 1

## 2012-02-02 MED ORDER — DOCUSATE SODIUM 50 MG/5ML PO LIQD
200.0000 mg | Freq: Two times a day (BID) | ORAL | Status: DC
  Administered 2012-02-02 (×2): 200 mg via ORAL
  Filled 2012-02-02 (×4): qty 20

## 2012-02-02 MED ORDER — PROMETHAZINE HCL 25 MG PO TABS
25.0000 mg | ORAL_TABLET | Freq: Four times a day (QID) | ORAL | Status: DC | PRN
  Administered 2012-02-08 – 2012-02-14 (×2): 25 mg via ORAL
  Filled 2012-02-02 (×2): qty 1

## 2012-02-02 MED ORDER — ACETAMINOPHEN 500 MG PO TABS: 1000.0000 mg | ORAL_TABLET | Freq: Four times a day (QID) | ORAL | Status: DC | PRN

## 2012-02-02 NOTE — Progress Notes (Signed)
Report given to Stark Klein, Charity fundraiser.  Transferred via wheelchair to room 1307.

## 2012-02-02 NOTE — Progress Notes (Signed)
Subjective: CROSS COVER [facility] Since I last evaluated the patient, she seems to be doing fairly well. No fevers overnight. Wants to move out of the ICU. Mild abdominal pain after consumption of full liquids. No BM in several days.   Objective: Vital signs in last 24 hours: Temp:  [98.8 F (37.1 C)-101 F (38.3 C)] 99.9 F (37.7 C) (06/23 0800) Pulse Rate:  [84-126] 96  (06/23 0812) Resp:  [17-35] 20  (06/23 0812) BP: (111-150)/(65-89) 122/72 mmHg (06/23 0812) SpO2:  [95 %-100 %] 100 % (06/23 0812) FiO2 (%):  [98 %] 98 % (06/22 1155) Last BM Date: 01/30/12  Intake/Output from previous day: 06/22 0701 - 06/23 0700 In: 3875 [P.O.:480; I.V.:3085; IV Piggyback:310] Out: 4500 [Urine:4500] Intake/Output this shift: Total I/O In: 302 [I.V.:300; IV Piggyback:2] Out: -   General appearance: alert, cooperative, appears stated age and no distress Resp: clear to auscultation bilaterally Cardio: regular rate and rhythm, S1, S2 normal, no murmur, click, rub or gallop GI: soft, non-tender; bowel sounds are somewhat decreased; no masses,  no organomegaly Extremities: extremities normal, atraumatic, no cyanosis or edema  Lab Results:  Basename 02/01/12 0501 01/31/12 1045  WBC 17.5* 23.4*  HGB 9.5* 10.2*  HCT 27.1* 29.6*  PLT 300 269   BMET  Basename 01/31/12 0455  NA 136  K 3.9  CL 106  CO2 24  GLUCOSE 137*  BUN 4*  CREATININE 0.41*  CALCIUM 7.7*   LFT  Basename 01/31/12 0455  PROT 5.4*  ALBUMIN 2.2*  AST 92*  ALT 85*  ALKPHOS 129*  BILITOT 0.5  BILIDIR 0.2  IBILI 0.3   Studies/Results: US Abdomen Limited  01/31/2012  *RADIOLOGY REPORT*  Clinical Data: Fever.  Severe pancreatitis.  LIMITED ABDOMEN ULTRASOUND FOR ASCITES  Technique:  Limited ultrasound survey for ascites was performed in all four abdominal quadrants.  Comparison:  CT abdomen pelvis 01/29/2012.  Findings: Minimal abdominal ascites is present.  There is no significant fluid collection to perform  paracentesis.  IMPRESSION: Minimal ascites without a significant fluid collection to perform a paracentesis.  Original Report Authenticated By: Jamesetta Orleans. MATTERN, M.D.    Medications: I have reviewed the patient's current medications.  Assessment/Plan: A/P Acute pancreatitis s/p ERCP : improving slowly. Will move to the 4th floor and check a CBC and a Lipase today. I would not advance her diet for now. Encourage ambulation as much as possible.  2) Narcotic induced constipation: Try Colace 200 mg PO BID. 3) History of SOD dysfunction.  LOS: 10 days   Previn Jian 02/02/2012, 10:36 AM

## 2012-02-02 NOTE — Progress Notes (Signed)
Spoke with Dr. Loreta Ave.  Order for NPO status.  Continue Demerol as ordered. Transfer order placed by Dr. Loreta Ave.

## 2012-02-02 NOTE — Consult Note (Signed)
ID f/u Note     Clinically improved and out of monitored bed. Blood cultures thru 6/23 remain sterile and she has been afebrile. WBC still elevated in 18K range and suspect transient sepsis secondary to pancreatitis is resolving. Antibiotics can porobably be stopped in next few days and Dr.Comer will f/u tomorrow. Cassie Berry

## 2012-02-02 NOTE — Progress Notes (Signed)
Patient shaking, complaints of chills.  Requested Demerol which is ordered prn. Demerol 25 mg given IV and 'rigors", shaking immediately subsided. Temperature 100.4 axillary.  Nausea and vomiting.  Emesis dark pink.  Patient ate strawberry yogurt for breakfast.  Paging Dr. Loreta Ave.

## 2012-02-03 LAB — CBC
Hemoglobin: 9.2 g/dL — ABNORMAL LOW (ref 12.0–15.0)
MCH: 29.7 pg (ref 26.0–34.0)
MCHC: 34.3 g/dL (ref 30.0–36.0)
MCV: 86.5 fL (ref 78.0–100.0)
RBC: 3.1 MIL/uL — ABNORMAL LOW (ref 3.87–5.11)

## 2012-02-03 MED ORDER — DOCUSATE SODIUM 100 MG PO CAPS
100.0000 mg | ORAL_CAPSULE | Freq: Every day | ORAL | Status: DC
  Administered 2012-02-05 – 2012-02-21 (×11): 100 mg via ORAL
  Filled 2012-02-03 (×29): qty 1

## 2012-02-03 MED ORDER — ACETAMINOPHEN 500 MG PO TABS: 1000.0000 mg | ORAL_TABLET | Freq: Four times a day (QID) | ORAL | Status: DC | PRN

## 2012-02-03 NOTE — Progress Notes (Signed)
 Gastroenterology Progress Note  SUBJECTIVE: "better than last week"  OBJECTIVE:  Vital signs in last 24 hours: Temp:  [98.4 F (36.9 C)-101.2 F (38.4 C)] 99 F (37.2 C) (06/24 0631) Pulse Rate:  [86-109] 89  (06/24 0631) Resp:  [15-22] 15  (06/24 0831) BP: (110-122)/(68-77) 110/70 mmHg (06/24 0631) SpO2:  [94 %-100 %] 96 % (06/24 0831) FiO2 (%):  [30 %-98 %] 30 % (06/24 0831) Last BM Date: 02/02/12 General:    white female in NAD Heart:  Regular rate and rhythm Lungs: Respirations even and unlabored, lungs CTA bilaterally Abdomen:  Soft, nondistended, mild epigastric tenderness. Good bowel sounds. Extremities:  Without edema. Neurologic:  Alert and oriented,  grossly normal neurologically. Psych:  Cooperative. Normal mood and affect.   Lab Results:  Basename 02/03/12 0650 02/02/12 1205 02/01/12 0501  WBC 14.4* 18.9* 17.5*  HGB 9.2* 10.0* 9.5*  HCT 26.8* 29.1* 27.1*  PLT 436* 365 300    ASSESSMENT / PLAN:  1. Post ERCP pancreatitis with SIRS, possibly transient sepsis. She looks the best I have seen her. Still having intermittent fevers. Wants to retry liquids. She feels vomiting yesterday was secondary to Demerol given for rigors. Will retry clears and see how she does.  2. Fevers, tmax 101.2. WBC down to 14.4. Blood culture negative to date. Insufficient ascites to do paracentesis. Urine negative. Pulmonary evaluated and didn't feel patient had acute pulmonary process as cause for fevers. ID following, hopefully antibiotics can be stopped soon.   3.  Constipation, resolved. Stools now loose on colace. Will decrease dose to once daily.     LOS: 11 days   [name]  02/03/2012, 9:03 AM Patient seen and I agree with the above documentation including the assessment and plan. Looks much better than last week, wants to retry full liquids. Abdomen soft, Will continue antibiotics for now, encourage ambulation. It appears she will not need cTNA.. Will bring FMLA  form for [place] to fill.

## 2012-02-03 NOTE — Progress Notes (Signed)
Name: Cassie Berry MRN: [id number] DOB: [birth year]    LOS: 11  Referring Provider:  Leone Payor Reason for Referral:  ? [facility]    PULMONARY / CRITICAL CARE MEDICINE  PROFILE:  46 y/o wf former smoker, s/p ERCP with sphincterotomy 6/13 complicated by pancreatitis, persistent fever and bilateral effusion on CT scan 6/19 and cxr 6/20. Concern for PNA raised and PCCM asked to eval 6/20    SUBJ:  Patient indicates she feels better overall but continues to have periods of pain / fever.  Denies shortness of breath, sputum production.    Vital Signs: Temp:  [98.4 F (36.9 C)-101.2 F (38.4 C)] 98.4 F (36.9 C) (06/24 1000) Pulse Rate:  [86-109] 87  (06/24 1000) Resp:  [15-22] 20  (06/24 1000) BP: (110-122)/(68-77) 112/68 mmHg (06/24 1000) SpO2:  [94 %-100 %] 100 % (06/24 1000) FiO2 (%):  [30 %-98 %] 30 % (06/24 0831) 2lpm NP    Physical Examination: General: NAD HEENT WNL Neck supple no jvd Lungs clear to A and P abd mildly distended, soft, mildly tender Ext No edema   CXR: no new film   Lines, Tubes, etc: RUE PICC 6/18 >>   Microbiology: MRSA PCR 6/21 >> neg PCT 6/20: 25.12 Urine 6/20 >> neg Blood X 2 6/20 >>  Blood X 2 6/21 >>     Antibiotics:  Imipenem 6/19 >>6/24   Studies/Events: CT abd/pelvis 6/19 >> Slight worsening of severe pancreatitis with extensive retroperitoneal inflammation and ill-defined fluid. No drainable fluid collection identified.  No evidence of pancreatic hemorrhage or necrosis. Slightly increased ascites, pleural effusions and flank edema. U/s 6/21 >Minimal ascites without a significant fluid collection to perform a  paracentesis.    Consults:  PCCM  Best Practice: DVT: SCDs SUP: PPI Nutrition: per GI Glycemic control: none Sedation/analgesia: PCA    Lab 01/31/12 0455 01/30/12 0300 01/29/12 0609  NA 136 132* 135  K 3.9 3.6 4.0  CL 106 100 103  CO2 24 22 26   BUN 4* 4* 4*  CREATININE 0.41* 0.40* 0.36*  GLUCOSE 137* 109*  129*    Lab 02/03/12 0650 02/02/12 1205 02/01/12 0501  HGB 9.2* 10.0* 9.5*  HCT 26.8* 29.1* 27.1*  WBC 14.4* 18.9* 17.5*  PLT 436* 365 300        IMPRESSION/PLAN:    1) Acute pancreatitis (01/23/2012) 6/21 Abd US>>>Minimal ascites without a significant fluid collection to perform a paracentesis.  Assessment: s/p ERCP Plan: mgmt per GI.    2) Bilateral pleural effusion (01/30/2012) Assessment: L>R, Likely related to severe pancreatitis, doubt PNA Plan:  -Cont supportive care -wean O2 to off for sats > 92%  3) Fever (01/30/2012) Assessment: Suspect due to abdominal source - pancreatitis vs cholangitis vs peritonitis Plan:  Has completed imipenem -Per ID Monitor fever curve / leukocytosis   4) Hyponatremia (01/30/2012) Assessment: resolved Plan:  Cont IVFs.  Monitor chemistries  5) Anemia,  Probably related to acute illness    PCCM will sign off.  Please call back if new concerns.  Thank you for the consult.    Canary Brim, NP-C [facility] Pgr: [phone] or [phone]  Pt independently  seen and examined and available labs reviewed and I agree with above findings/ imp/ plan  Sandrea Hughs, MD Pulmonary and Critical Care Medicine [facility] Cell [phone]

## 2012-02-03 NOTE — Progress Notes (Signed)
UR done. 

## 2012-02-03 NOTE — Progress Notes (Signed)
INFECTIOUS DISEASE PROGRESS NOTE  ID: Cassie Berry is a 46 y.o. female with post ERCP pancreatitis, high fever concerning for infection.   Subjective: Feels better, trying some po today.   Abtx:  Anti-infectives     Start     Dose/Rate Route Frequency Ordered Stop   01/29/12 1200   imipenem-cilastatin (PRIMAXIN) 500 mg in sodium chloride 0.9 % 100 mL IVPB  Status:  Discontinued        500 mg 200 mL/hr over 30 Minutes Intravenous 4 times per day 01/29/12 1151 02/03/12 1137   01/27/12 1200   imipenem-cilastatin (PRIMAXIN) 500 mg in sodium chloride 0.9 % 100 mL IVPB  Status:  Discontinued        500 mg 200 mL/hr over 30 Minutes Intravenous Every 8 hours 01/27/12 1000 01/29/12 1151   01/23/12 1300   ampicillin-sulbactam (UNASYN) 1.5 g in sodium chloride 0.9 % 50 mL IVPB        1.5 g 100 mL/hr over 30 Minutes Intravenous  Once 01/23/12 1300 01/23/12 1411          Medications: I have reviewed the patient's current medications.  Objective: Vital signs in last 24 hours: Temp:  [98.4 F (36.9 C)-101.2 F (38.4 C)] 98.4 F (36.9 C) (06/24 1000) Pulse Rate:  [86-109] 87  (06/24 1000) Resp:  [15-22] 20  (06/24 1000) BP: (110-122)/(68-77) 112/68 mmHg (06/24 1000) SpO2:  [94 %-100 %] 100 % (06/24 1000) FiO2 (%):  [30 %-98 %] 30 % (06/24 0831)   General appearance: alert, cooperative and no distress GI: soft, some tenderness  Lab Results  Basename 02/03/12 0650 02/02/12 1205  WBC 14.4* 18.9*  HGB 9.2* 10.0*  HCT 26.8* 29.1*  NA -- --  K -- --  CL -- --  CO2 -- --  BUN -- --  CREATININE -- --  GLU -- --   Liver Panel No results found for this basename: PROT:2,ALBUMIN:2,AST:2,ALT:2,ALKPHOS:2,BILITOT:2,BILIDIR:2,IBILI:2 in the last 72 hours Sedimentation Rate No results found for this basename: ESRSEDRATE in the last 72 hours C-Reactive Protein No results found for this basename: CRP:2 in the last 72 hours  Microbiology: Recent Results (from the past 240 hour(s))    CULTURE, BLOOD (ROUTINE X 2)     Status: Normal (Preliminary result)   Collection Time   01/30/12  3:00 AM      Component Value Range Status Comment   Specimen Description BLOOD LEFT ANTECUBITAL   Final    Special Requests BOTTLES DRAWN AEROBIC AND ANAEROBIC   Final    Culture  Setup Time [id number]   Final    Culture     Final    Value:        BLOOD CULTURE RECEIVED NO GROWTH TO DATE CULTURE WILL BE HELD FOR 5 DAYS BEFORE ISSUING A FINAL NEGATIVE REPORT   Report Status PENDING   Incomplete   CULTURE, BLOOD (ROUTINE X 2)     Status: Normal (Preliminary result)   Collection Time   01/30/12  3:05 AM      Component Value Range Status Comment   Specimen Description BLOOD L HAND   Final    Special Requests Normal BOTTLES DRAWN AEROBIC AND ANAEROBIC   Final    Culture  Setup Time [id number]   Final    Culture     Final    Value:        BLOOD CULTURE RECEIVED NO GROWTH TO DATE CULTURE WILL BE HELD FOR 5 DAYS BEFORE ISSUING  A FINAL NEGATIVE REPORT   Report Status PENDING   Incomplete   URINE CULTURE     Status: Normal   Collection Time   01/30/12  6:35 AM      Component Value Range Status Comment   Specimen Description URINE, CLEAN CATCH   Final    Special Requests Imipenem Normal   Final    Culture  Setup Time [id number]   Final    Colony Count NO GROWTH   Final    Culture NO GROWTH   Final    Report Status 01/31/2012 FINAL   Final   CULTURE, BLOOD (ROUTINE X 2)     Status: Normal (Preliminary result)   Collection Time   01/31/12  8:49 AM      Component Value Range Status Comment   Specimen Description BLOOD LEFT ARM   Final    Special Requests BOTTLES DRAWN AEROBIC AND ANAEROBIC 10CC   Final    Culture  Setup Time [id number]   Final    Culture     Final    Value:        BLOOD CULTURE RECEIVED NO GROWTH TO DATE CULTURE WILL BE HELD FOR 5 DAYS BEFORE ISSUING A FINAL NEGATIVE REPORT   Report Status PENDING   Incomplete   CULTURE, BLOOD (ROUTINE X 2)     Status: Normal  (Preliminary result)   Collection Time   01/31/12  8:50 AM      Component Value Range Status Comment   Specimen Description BLOOD LEFT HAND   Final    Special Requests BOTTLES DRAWN AEROBIC AND ANAEROBIC [facility]   Final    Culture  Setup Time [id number]   Final    Culture     Final    Value:        BLOOD CULTURE RECEIVED NO GROWTH TO DATE CULTURE WILL BE HELD FOR 5 DAYS BEFORE ISSUING A FINAL NEGATIVE REPORT   Report Status PENDING   Incomplete   MRSA PCR SCREENING     Status: Normal   Collection Time   01/31/12  9:45 AM      Component Value Range Status Comment   MRSA by PCR NEGATIVE  NEGATIVE Final     Studies/Results: No results found.   Assessment/Plan: 1) Pancreatitis - no signs of infection.  Will d/c antibiotics.  Supportive care per GI.    Please call if needed.  Thanks  Elven Laboy Infectious Diseases 02/03/2012, 11:38 AM

## 2012-02-04 LAB — CBC
HCT: 27.3 % — ABNORMAL LOW (ref 36.0–46.0)
Hemoglobin: 9.4 g/dL — ABNORMAL LOW (ref 12.0–15.0)
MCH: 29.9 pg (ref 26.0–34.0)
MCHC: 34.4 g/dL (ref 30.0–36.0)
MCV: 86.9 fL (ref 78.0–100.0)
RDW: 14.6 % (ref 11.5–15.5)

## 2012-02-04 MED ORDER — ACETAMINOPHEN 500 MG PO TABS: 1000.0000 mg | ORAL_TABLET | Freq: Four times a day (QID) | ORAL | Status: DC | PRN

## 2012-02-04 NOTE — Progress Notes (Signed)
[facility] Progress Note  SUBJECTIVE: feeling better. She ate yogurt and ambulating in halls.   OBJECTIVE:  Vital signs in last 24 hours: Temp:  [98.4 F (36.9 C)-100.9 F (38.3 C)] 100 F (37.8 C) (06/25 1610) Pulse Rate:  [87-101] 100  (06/25 0632) Resp:  [18-23] 20  (06/25 0801) BP: (111-129)/(61-77) 117/67 mmHg (06/25 0632) SpO2:  [98 %-100 %] 100 % (06/25 0801) FiO2 (%):  [30 %] 30 % (06/24 2124) Last BM Date: 02/03/12 General:    white female in NAD Heart:  Slightly tachycardic Lungs: Respirations even and unlabored, lungs CTA bilaterally Abdomen:  Soft, mild right mid and RLL quadrant tenderness. Mild epigastric  Normal bowel sounds. Extremities:  Without edema. Neurologic:  Alert and oriented,  grossly normal neurologically. Psych:  Cooperative. Normal mood and affect.   Lab Results:  Basename 02/04/12 0700 02/03/12 0650 02/02/12 1205  WBC 13.5* 14.4* 18.9*  HGB 9.4* 9.2* 10.0*  HCT 27.3* 26.8* 29.1*  PLT 505* 436* 365     ASSESSMENT / PLAN:  Post ERCP pancreatitis with SIRS, possibly transient sepsis.  Tmax 100.9, WBC down to 13.5. Appreciate ID and Pulmonary help. Patient continues to improve. She is tolerating some PO, ambulating. ID stopped antibiotics. Continue full liquids, IVF, supportive care.     LOS: 12 days   [name]  02/04/2012, 9:16 AM   .I have reviewed the above note, examined the patient and agree with plan of treatment. Antibiotics were discontinued yesterday,  I have reviewed her CT scam from last week with respect to her RLQ abd. Pain. There is an extension of the peripancreatic phlegmon to the right gutter, compressing the colon which appears thickened. Will continue full liquids.

## 2012-02-04 NOTE — Progress Notes (Signed)
Spoke w/Paula, PA regarding Zofran schedule and Prn schedule. Pt has been overlapping times based on need of medications. Per Gunnar Fusi the Zofran that is scheduled will be discontinued and will resume the prn dosage. Pt informed and verbalized understanding of decision.

## 2012-02-05 LAB — CULTURE, BLOOD (ROUTINE X 2)
Culture: NO GROWTH
Special Requests: NORMAL

## 2012-02-05 MED ORDER — VITAMINS A & D EX OINT
TOPICAL_OINTMENT | CUTANEOUS | Status: AC
  Administered 2012-02-05: 21:00:00
  Filled 2012-02-05: qty 5

## 2012-02-05 NOTE — Progress Notes (Signed)
[facility] Progress Note  SUBJECTIVE: "I'm getting there". Slept okay last night.   OBJECTIVE:  Vital signs in last 24 hours: Temp:  [98.2 F (36.8 C)-101.4 F (38.6 C)] 99 F (37.2 C) (06/26 0612) Pulse Rate:  [85-103] 85  (06/26 0612) Resp:  [18-23] 19  (06/26 0900) BP: (95-122)/(59-73) 95/59 mmHg (06/26 0612) SpO2:  [98 %-100 %] 99 % (06/26 0612) FiO2 (%):  [97 %] 97 % (06/26 0900) Last BM Date: 02/03/12 General:   Pleasant  white female in NAD Heart:  Regular rate and rhythm Lungs: Respirations even and unlabored, lungs CTA bilaterally Abdomen:  Soft, nondistended, mild RLQ tenderness. Normal bowel sounds. Extremities:  Without edema. Neurologic:  Alert and oriented,  grossly normal neurologically. Psych:  Cooperative. Normal mood and affect.    Lab Results:  Basename 02/04/12 0700 02/03/12 0650 02/02/12 1205  WBC 13.5* 14.4* 18.9*  HGB 9.4* 9.2* 10.0*  HCT 27.3* 26.8* 29.1*  PLT 505* 436* 365     ASSESSMENT / PLAN:  1. Post-ERCP pancreatitis. Tmax 101.4. She has intermittent, diffuse sharp pain but overall pain improved. No nausea, taking anti-emetics before meals to prevent nausea.  Up moving about in room. Having some bowel movement, urinating. She is eating yogurt and cream potato soup. She has asked if sister can bring in vegetable soup (meatless). Will let nurse know this is okay for her. Continue supportive care. She wants to keep PCA for time being. Check am labs.    2. Pleural effusions secondary to severe pancreatitis. Her 02 sats are normal (without 02).     LOS: 13 days   [name]  02/05/2012, 9:25 AM. I have reviewed the above note, examined the patient and agree with plan of treatment.Her fevers are secondary to the pancreatic phlegmon which has  extended into the right pericolic are. She will be able to start on solid food tomorrow., abdominal exam much improved. Had a solid BM.

## 2012-02-06 ENCOUNTER — Inpatient Hospital Stay (HOSPITAL_COMMUNITY): Payer: 59

## 2012-02-06 LAB — CULTURE, BLOOD (ROUTINE X 2)
Culture  Setup Time: 201306211307
Culture: NO GROWTH
Culture: NO GROWTH

## 2012-02-06 LAB — CBC
MCH: 29.6 pg (ref 26.0–34.0)
MCHC: 33.2 g/dL (ref 30.0–36.0)
MCV: 89 fL (ref 78.0–100.0)
Platelets: 658 10*3/uL — ABNORMAL HIGH (ref 150–400)
RDW: 14.8 % (ref 11.5–15.5)

## 2012-02-06 MED ORDER — BOOST / RESOURCE BREEZE PO LIQD
1.0000 | Freq: Two times a day (BID) | ORAL | Status: DC
  Administered 2012-02-06 – 2012-02-14 (×9): 1 via ORAL

## 2012-02-06 NOTE — Progress Notes (Signed)
Subjective i am getting there  Objective:tolerating full liquids, ambulating in hall way, one episode of night sweat last night, 1 formed stool, took pain med x2 in last 12 hours Vital signs in last 24 hours: Temp:  [98.7 F (37.1 C)-101.3 F (38.5 C)] 99.6 F (37.6 C) (06/27 0525) Pulse Rate:  [83-101] 89  (06/27 0630) Resp:  [18-23] 18  (06/27 0745) BP: (100-118)/(68-70) 113/70 mmHg (06/27 0525) SpO2:  [96 %-99 %] 99 % (06/27 0630) FiO2 (%):  [28 %-98 %] 98 % (06/27 0745) Last BM Date: 02/04/12 General:   Alert,  pleasant, cooperative in NAD Head:  Normocephalic and atraumatic. Eyes:  Sclera clear, no icterus.   Conjunctiva pink. Mouth:  No deformity or lesions, dentition normal. Neck:  Supple; no masses or thyromegaly. Heart:  Regular rate and rhythm; no murmurs, clicks, rubs,  or gallops. Lungs:  No wheezes or rales, no rales Abdomen:  Soft, normal BS's,mild tenderness epigastrium Msk:  Symmetrical without gross deformities. Normal posture. Pulses:  Normal pulses noted. Extremities:  Without clubbing or edema. Neurologic:  Alert and  oriented x4;  grossly normal neurologically. Skin:  Intact without significant lesions or rashes.  Intake/Output from previous day: 06/26 0701 - 06/27 0700 In: 9451.6 [P.O.:1320; I.V.:8131.6] Out: -  Intake/Output this shift: Total I/O In: 300 [P.O.:300] Out: -   Lab Results:  Basename 02/06/12 0500 02/04/12 0700  WBC 11.7* 13.5*  HGB 8.9* 9.4*  HCT 26.8* 27.3*  PLT 658* 505*   BMET No results found for this basename: NA:3,K:3,CL:3,CO2:3,GLUCOSE:3,BUN:3,CREATININE:3,CALCIUM:3 in the last 72 hours LFT No results found for this basename: PROT,ALBUMIN,AST,ALT,ALKPHOS,BILITOT,BILIDIR,IBILI in the last 72 hours PT/INR No results found for this basename: LABPROT:2,INR:2 in the last 72 hours Hepatitis Panel No results found for this basename: HEPBSAG,HCVAB,HEPAIGM,HEPBIGM in the last 72 hours  Studies/Results: No results  found.   ASSESSMENT:   Active Problems:  Acute pancreatitis  Bilateral pleural effusion  Fever  Hyponatremia  Anemia     PLAN:   Continues to improve in all aspects, will advance to fat modified diet, , may D/C O2, continue IV pain meds for now,     LOS: 14 days   Lina Sar  02/06/2012, 8:35 AM

## 2012-02-06 NOTE — Progress Notes (Signed)
Nutrition Follow-up  Intervention:   1. Patient educated on a low fat diet for pancreatitis. We reviewed menu items. She was encouraged to incorporate tolerated protein foods at meals.  2. Resource Breeze BID  Diet Order:  Fat Modified, 40% completion  Patient reports that she feels a little better, but still has some abdominal pain. She has been eating mostly yogurt and milk, but it unsure what foods she should eat.   Meds: Scheduled Meds:   . antiseptic oral rinse  15 mL Mouth Rinse q12n4p  . buPROPion  300 mg Oral Daily  . chlorhexidine  15 mL Mouth Rinse BID  . docusate sodium  100 mg Oral Daily  . loratadine  10 mg Oral Daily  . magic mouthwash  5 mL Oral TID  . morphine   Intravenous Q4H  . pantoprazole (PROTONIX) IV  40 mg Intravenous Q1200  . sodium chloride  10-40 mL Intracatheter Q12H  . vitamin A & D       Continuous Infusions:   . dextrose 5 % and 0.9 % NaCl with KCl 40 mEq/L 75 mL/hr at 02/06/12 1043   PRN Meds:.acetaminophen, diphenhydrAMINE, diphenhydrAMINE, hyoscyamine, LORazepam, meperidine (DEMEROL) injection, naloxone, ondansetron (ZOFRAN) IV, ondansetron, promethazine, promethazine, promethazine, sodium chloride, sodium chloride, sodium chloride  Labs:  CMP     Component Value Date/Time   NA 136 01/31/2012 0455   K 3.9 01/31/2012 0455   CL 106 01/31/2012 0455   CO2 24 01/31/2012 0455   GLUCOSE 137* 01/31/2012 0455   BUN 4* 01/31/2012 0455   CREATININE 0.41* 01/31/2012 0455   CALCIUM 7.7* 01/31/2012 0455   PROT 5.4* 01/31/2012 0455   ALBUMIN 2.2* 01/31/2012 0455   AST 92* 01/31/2012 0455   ALT 85* 01/31/2012 0455   ALKPHOS 129* 01/31/2012 0455   BILITOT 0.5 01/31/2012 0455   GFRNONAA >90 01/31/2012 0455   GFRAA >90 01/31/2012 0455     Intake/Output Summary (Last 24 hours) at 02/06/12 1430 Last data filed at 02/06/12 4098  Gross per 24 hour  Intake 9391.63 ml  Output      0 ml  Net 9391.63 ml    Weight Status:  75.3 kg on 6/22  Re-estimated needs:    1900-2200 kcal 110-120 g protein  Nutrition Dx:  Inadequate oral intake, improved, ongoing  Goal:  Advance diet as tolerated, met  Monitor:  Weight, PO intake, labs

## 2012-02-07 LAB — PREALBUMIN: Prealbumin: 12.8 mg/dL — ABNORMAL LOW (ref 17.0–34.0)

## 2012-02-07 NOTE — Progress Notes (Signed)
Subjective Trying low fat diet,got nauseated, feels weak ambulating in the hallway  Objective:continues to improve, still depends on the Morphine pump every 6-8 hours, eating about 20% of her food, fevers have subsided,, off O2, CXR shows clearance of the pleural effusions Vital signs in last 24 hours: Temp:  [98.6 F (37 C)-99.7 F (37.6 C)] 98.6 F (37 C) (06/28 0636) Pulse Rate:  [88-98] 88  (06/28 0636) Resp:  [17-23] 21  (06/28 0636) BP: (94-113)/(54-71) 95/60 mmHg (06/28 0636) SpO2:  [96 %-97 %] 96 % (06/28 0636) Last BM Date: 02/06/12 General:   Alert,  pleasant, cooperative in NAD Head:  Normocephalic and atraumatic. Eyes:  Sclera clear, no icterus.   Conjunctiva pink. Mouth:  No deformity or lesions, dentition normal. Neck:  Supple; no masses or thyromegaly. Heart:  Regular rate and rhythm; no murmurs, clicks, rubs,  or gallops. Lungs:  No wheezes or rales Abdomen:  Soft, not distended, epigastric tenderness persists but RLQ tenderness has resolved  Msk:  Symmetrical without gross deformities. Normal posture. Pulses:  Normal pulses noted. Extremities:  Without clubbing or edema. Neurologic:  Alert and  oriented x4;  grossly normal neurologically. Skin:  Intact without significant lesions or rashes.  Intake/Output from previous day: 06/27 0701 - 06/28 0700 In: 440 [P.O.:430; I.V.:10] Out: -  Intake/Output this shift:    Lab Results:  Basename 02/06/12 0500  WBC 11.7*  HGB 8.9*  HCT 26.8*  PLT 658*   BMET No results found for this basename: NA:3,K:3,CL:3,CO2:3,GLUCOSE:3,BUN:3,CREATININE:3,CALCIUM:3 in the last 72 hours LFT No results found for this basename: PROT,ALBUMIN,AST,ALT,ALKPHOS,BILITOT,BILIDIR,IBILI in the last 72 hours PT/INR No results found for this basename: LABPROT:2,INR:2 in the last 72 hours Hepatitis Panel No results found for this basename: HEPBSAG,HCVAB,HEPAIGM,HEPBIGM in the last 72 hours  Studies/Results: Dg Chest 2 View  02/06/2012   *RADIOLOGY REPORT*  Clinical Data: Follow-up pleural effusions.  Pancreatitis.  CHEST - 2 VIEW  Comparison: 01/30/2012  Findings: Resolution of the left pleural effusion.  There is a bibasilar atelectasis persist.  Heart is normal size.  No acute bony abnormality.  Right PICC line has slightly retracted with the tip likely in the right innominate vein.  IMPRESSION: Interval resolution of left effusion.  Areas of bibasilar atelectasis.  Original Report Authenticated By: Cyndie Chime, M.D.     ASSESSMENT:   Active Problems:  Acute pancreatitis  Bilateral pleural effusion  Fever  Hyponatremia  Anemia     PLAN:   Gradual clinical improvement in post ERCP pancreatitis, resolution of the fevers, starting to tolerate solid food, continue MSO4 pump over the weekend Follow up labs, WBC trending down Will discuss repating CT scan before discharge     LOS: 15 days   Lina Sar  02/07/2012, 9:37 AM

## 2012-02-08 LAB — COMPREHENSIVE METABOLIC PANEL
ALT: 24 U/L (ref 0–35)
AST: 22 U/L (ref 0–37)
Albumin: 2.5 g/dL — ABNORMAL LOW (ref 3.5–5.2)
Alkaline Phosphatase: 106 U/L (ref 39–117)
Calcium: 8.8 mg/dL (ref 8.4–10.5)
GFR calc Af Amer: >90 mL/min (ref >90)
Potassium: 4 mEq/L (ref 3.5–5.1)
Sodium: 133 mEq/L — ABNORMAL LOW (ref 135–145)
Total Protein: 6.5 g/dL (ref 6.0–8.3)

## 2012-02-08 MED ORDER — HYDROMORPHONE HCL 2 MG PO TABS
2.0000 mg | ORAL_TABLET | ORAL | Status: DC | PRN
  Administered 2012-02-08: 2 mg via ORAL
  Filled 2012-02-08 (×2): qty 1

## 2012-02-08 NOTE — Progress Notes (Signed)
Pt c/o of discomfort in R shoulder after new PICC line placed. No redness or swelling noted.  Site flushes without difficulty and has good blood return.  PICC RN and MD notified. Orders received to discontinue line. Will notify IV team and continue to monitor.

## 2012-02-08 NOTE — Progress Notes (Signed)
Pt with one episode of emesis around lunchtime. Pt only tolerated yogurt from breakfast tray and hadn't attempted any lunch prior to vomiting. Antiemetic administered and effective. Will continue to monitor

## 2012-02-08 NOTE — Progress Notes (Signed)
Pt c/o burning in R arm at Midline site. States pain radiates to elbow and shoulder. No redness or swelling noted. IV fluids turned off.   IV team notified as well as MD. Orders received to remove line and place new PICC line. Will continue to monitor closely

## 2012-02-08 NOTE — Progress Notes (Signed)
PROGRESS NOTE FOR [facility].  Subjective: No acute events.  She is not able to tolerate solids very well at this time.  She only uses pain medications at the time of PO intake.  Objective: Vital signs in last 24 hours: Temp:  [99.1 F (37.3 C)-99.8 F (37.7 C)] 99.1 F (37.3 C) (06/29 0539) Pulse Rate:  [90-98] 95  (06/29 0539) Resp:  [16-26] 22  (06/29 0826) BP: (111-116)/(71-75) 111/71 mmHg (06/29 0539) SpO2:  [94 %-96 %] 94 % (06/29 0539) Last BM Date: 02/07/12  Intake/Output from previous day: 06/28 0701 - 06/29 0700 In: 1241 [P.O.:600; I.V.:641] Out: -  Intake/Output this shift:    General appearance: alert and no distress GI: tender in the epigastric region.  Lab Results:  Basename 02/06/12 0500  WBC 11.7*  HGB 8.9*  HCT 26.8*  PLT 658*   BMET  Basename 02/08/12 0633  NA 133*  K 4.0  CL 99  CO2 25  GLUCOSE 106*  BUN 5*  CREATININE 0.46*  CALCIUM 8.8   LFT  Basename 02/08/12 0633  PROT 6.5  ALBUMIN 2.5*  AST 22  ALT 24  ALKPHOS 106  BILITOT 0.3  BILIDIR --  IBILI --   PT/INR No results found for this basename: LABPROT:2,INR:2 in the last 72 hours Hepatitis Panel No results found for this basename: HEPBSAG,HCVAB,HEPAIGM,HEPBIGM in the last 72 hours C-Diff No results found for this basename: CDIFFTOX:3 in the last 72 hours Fecal Lactopherrin No results found for this basename: FECLLACTOFRN in the last 72 hours  Studies/Results: Dg Chest 2 View  02/06/2012  *RADIOLOGY REPORT*  Clinical Data: Follow-up pleural effusions.  Pancreatitis.  CHEST - 2 VIEW  Comparison: 01/30/2012  Findings: Resolution of the left pleural effusion.  There is a bibasilar atelectasis persist.  Heart is normal size.  No acute bony abnormality.  Right PICC line has slightly retracted with the tip likely in the right innominate vein.  IMPRESSION: Interval resolution of left effusion.  Areas of bibasilar atelectasis.  Original Report Authenticated By: Cyndie Chime, M.D.      Medications:  Scheduled:   . antiseptic oral rinse  15 mL Mouth Rinse q12n4p  . buPROPion  300 mg Oral Daily  . chlorhexidine  15 mL Mouth Rinse BID  . docusate sodium  100 mg Oral Daily  . feeding supplement  1 Container Oral BID BM  . loratadine  10 mg Oral Daily  . magic mouthwash  5 mL Oral TID  . morphine   Intravenous Q4H  . pantoprazole (PROTONIX) IV  40 mg Intravenous Q1200  . sodium chloride  10-40 mL Intracatheter Q12H   Continuous:   . dextrose 5 % and 0.9 % NaCl with KCl 40 mEq/L 75 mL/hr at 02/08/12 0825    Assessment/Plan: 1) Severe post-ERCP pancreatitis.   The patient is make progress slowly.  No acute events.  Plan: 1) Continue with pain control and supportive care.  LOS: 16 days   Toussaint Golson D 02/08/2012, 8:44 AM

## 2012-02-08 NOTE — Progress Notes (Addendum)
Peripherally Inserted Central Catheter/Midline Placement  The IV Nurse has discussed with the patient and/or persons authorized to consent for the patient, the purpose of this procedure and the potential benefits and risks involved with this procedure.  The benefits include less needle sticks, lab draws from the catheter and patient may be discharged home with the catheter.  Risks include, but not limited to, infection, bleeding, blood clot (thrombus formation), and puncture of an artery; nerve damage and irregular heat beat.  Alternatives to this procedure were also discussed.      Cassie Berry 02/08/2012, 1:22 PM  Assessed R DL PICC basilic vein. Complained of pain at insertion site and axillary. Small, hard area felt proximal to insertion site. Axillary area red.  Assessed left arm for placement of PICC line, small veins noted. R upper arm assessed and cephalic vein selected for new DL PICC. Placed without complications. Removed basilic DL PICC, 39 cm, without difficulty. Pt stated it was painful when removed. Noted light pink and white tissue on PICC.

## 2012-02-09 MED ORDER — SODIUM CHLORIDE 0.9 % IJ SOLN: 9.0000 mL | INTRAMUSCULAR | Status: DC | PRN

## 2012-02-09 MED ORDER — DIPHENHYDRAMINE HCL 50 MG/ML IJ SOLN: 12.5000 mg | Freq: Four times a day (QID) | INTRAMUSCULAR | Status: DC | PRN

## 2012-02-09 MED ORDER — NALOXONE HCL 0.4 MG/ML IJ SOLN: 0.4000 mg | INTRAMUSCULAR | Status: DC | PRN

## 2012-02-09 MED ORDER — KCL IN DEXTROSE-NACL 40-5-0.9 MEQ/L-%-% IV SOLN
INTRAVENOUS | Status: DC
  Administered 2012-02-09 – 2012-02-10 (×3): via INTRAVENOUS
  Administered 2012-02-11: 1000 mL via INTRAVENOUS
  Administered 2012-02-12 – 2012-02-13 (×2): via INTRAVENOUS
  Administered 2012-02-14: 1000 mL via INTRAVENOUS
  Administered 2012-02-14 – 2012-02-15 (×2): via INTRAVENOUS
  Filled 2012-02-09 (×21): qty 1000

## 2012-02-09 MED ORDER — ONDANSETRON HCL 4 MG/2ML IJ SOLN
4.0000 mg | Freq: Four times a day (QID) | INTRAMUSCULAR | Status: DC | PRN
  Administered 2012-02-09: 4 mg via INTRAVENOUS
  Filled 2012-02-09 (×6): qty 2

## 2012-02-09 MED ORDER — DIPHENHYDRAMINE HCL 12.5 MG/5ML PO ELIX: 12.5000 mg | ORAL_SOLUTION | Freq: Four times a day (QID) | ORAL | Status: DC | PRN

## 2012-02-09 MED ORDER — MORPHINE SULFATE (PF) 1 MG/ML IV SOLN
INTRAVENOUS | Status: DC
  Administered 2012-02-09: 2 mg via INTRAVENOUS
  Administered 2012-02-09: 13:00:00 via INTRAVENOUS
  Administered 2012-02-09: 5 mg via INTRAVENOUS
  Administered 2012-02-09 – 2012-02-10 (×4): 1 mg via INTRAVENOUS
  Administered 2012-02-10: 4 mg via INTRAVENOUS
  Administered 2012-02-10: 3 mg via INTRAVENOUS
  Administered 2012-02-11: 25 mg via INTRAVENOUS
  Administered 2012-02-11: 3 mg via INTRAVENOUS
  Administered 2012-02-11: 5.89 mg via INTRAVENOUS
  Administered 2012-02-11: 4.47 mg via INTRAVENOUS
  Administered 2012-02-11: 2 mg via INTRAVENOUS
  Administered 2012-02-11: 4.49 mg via INTRAVENOUS
  Administered 2012-02-12: 5.5 mg via INTRAVENOUS
  Administered 2012-02-12: 10.5 mg via INTRAVENOUS
  Administered 2012-02-12: 2.9 mg via INTRAVENOUS
  Administered 2012-02-12: 25 mL via INTRAVENOUS
  Administered 2012-02-12: 1.8 mL via INTRAVENOUS
  Administered 2012-02-13: 6 mg via INTRAVENOUS
  Administered 2012-02-13 (×2): 7.5 mg via INTRAVENOUS
  Administered 2012-02-13: 09:00:00 via INTRAVENOUS
  Administered 2012-02-14: 4.5 mg via INTRAVENOUS
  Administered 2012-02-14: 0.1 mg via INTRAVENOUS
  Administered 2012-02-14: 14:00:00 via INTRAVENOUS
  Administered 2012-02-14: 7.5 mg via INTRAVENOUS
  Administered 2012-02-14: 1.5 mg via INTRAVENOUS
  Administered 2012-02-15: 4.5 mg via INTRAVENOUS
  Administered 2012-02-15: 3 mg via INTRAVENOUS
  Administered 2012-02-15: 0.1 mg via INTRAVENOUS
  Filled 2012-02-09 (×5): qty 25

## 2012-02-09 NOTE — Progress Notes (Signed)
Subjective: Problems with PICC line yesterday, but now it seems to be okay.  Vomited with PO intake yesterday.  Objective: Vital signs in last 24 hours: Temp:  [98.8 F (37.1 C)-100.5 F (38.1 C)] 98.8 F (37.1 C) (06/30 0603) Pulse Rate:  [95-107] 95  (06/30 0603) Resp:  [14-22] 16  (06/30 0603) BP: (103-116)/(68-73) 105/70 mmHg (06/30 0603) SpO2:  [90 %-96 %] 95 % (06/30 0603) Last BM Date: 02/08/12  Intake/Output from previous day: 06/29 0701 - 06/30 0700 In: 340 [P.O.:340] Out: -  Intake/Output this shift:    General appearance: alert and no distress GI: tender in the epigastric region.  Lab Results: No results found for this basename: WBC:3,HGB:3,HCT:3,PLT:3 in the last 72 hours BMET  [facility] 02/08/12 0633  NA 133*  K 4.0  CL 99  CO2 25  GLUCOSE 106*  BUN 5*  CREATININE 0.46*  CALCIUM 8.8   LFT  Basename 02/08/12 0633  PROT 6.5  ALBUMIN 2.5*  AST 22  ALT 24  ALKPHOS 106  BILITOT 0.3  BILIDIR --  IBILI --   PT/INR No results found for this basename: LABPROT:2,INR:2 in the last 72 hours Hepatitis Panel No results found for this basename: HEPBSAG,HCVAB,HEPAIGM,HEPBIGM in the last 72 hours C-Diff No results found for this basename: CDIFFTOX:3 in the last 72 hours Fecal Lactopherrin No results found for this basename: FECLLACTOFRN in the last 72 hours  Studies/Results: No results found.  Medications:  Scheduled:   . antiseptic oral rinse  15 mL Mouth Rinse q12n4p  . buPROPion  300 mg Oral Daily  . chlorhexidine  15 mL Mouth Rinse BID  . docusate sodium  100 mg Oral Daily  . feeding supplement  1 Container Oral BID BM  . loratadine  10 mg Oral Daily  . magic mouthwash  5 mL Oral TID  . pantoprazole (PROTONIX) IV  40 mg Intravenous Q1200  . sodium chloride  10-40 mL Intracatheter Q12H  . DISCONTD: morphine   Intravenous Q4H   Continuous:   . DISCONTD: dextrose 5 % and 0.9 % NaCl with KCl 40 mEq/L 75 mL/hr at 02/08/12 0825     Assessment/Plan: 1) Severe post-ERCP pancreatitis.    Keep in the PICC line at this time.  Okay to hold the IV fluids and still continue with PO pain meds.  Plan:  1) Continue with pain control and supportive care.   LOS: 17 days   Kylee Nardozzi D 02/09/2012, 8:25 AM

## 2012-02-10 ENCOUNTER — Inpatient Hospital Stay (HOSPITAL_COMMUNITY): Payer: 59

## 2012-02-10 ENCOUNTER — Encounter (HOSPITAL_COMMUNITY): Payer: Self-pay | Admitting: Radiology

## 2012-02-10 MED ORDER — IOHEXOL 300 MG/ML  SOLN
100.0000 mL | Freq: Once | INTRAMUSCULAR | Status: AC | PRN
  Administered 2012-02-10: 100 mL via INTRAVENOUS

## 2012-02-10 NOTE — Progress Notes (Signed)
[facility] Progress Note  SUBJECTIVE:  nausea this am but then not eating solids. Having BMs.   OBJECTIVE:  Vital signs in last 24 hours: Temp:  [99.3 F (37.4 C)-100.7 F (38.2 C)] 99.3 F (37.4 C) (07/01 0358) Pulse Rate:  [94-98] 98  (07/01 0358) Resp:  [16-23] 19  (07/01 0400) BP: (100-115)/(60-70) 100/60 mmHg (07/01 0358) SpO2:  [94 %-98 %] 94 % (07/01 0400) FiO2 (%):  [98 %] 98 % (06/30 1307) Last BM Date: 02/09/12 General:    white female in NAD Heart:  Regular rate and rhythm; no murmurs Lungs: Respirations even and unlabored, lungs CTA bilaterally Abdomen:  Soft, nondistended, moderate epigastric tenderness. Active bowel sounds.  Extremities:  Without edema. Neurologic:  Alert and oriented,  grossly normal neurologically. Psych:  Cooperative. Normal mood and affect.  Lab Results: No results found for this basename: WBC:3,HGB:3,HCT:3,PLT:3 in the last 72 hours BMET  [facility] 02/08/12 0633  NA 133*  K 4.0  CL 99  CO2 25  GLUCOSE 106*  BUN 5*  CREATININE 0.46*  CALCIUM 8.8   LFT  Basename 02/08/12 0633  PROT 6.5  ALBUMIN 2.5*  AST 22  ALT 24  ALKPHOS 106  BILITOT 0.3  BILIDIR --  IBILI --    ASSESSMENT / PLAN:  1. Severe post-ERCP pancreatitis, slow to recover. Tmax 100.7. She had a setback over weekend. Last week she was tolerating some solids but now vomiting with solids. Complains of "full feeling" in stomach. Vomits undigested food. Will repeat contrast CTscan of abd/pelvis to evaluate for pseudocysts which can cause partial GOO.   2. RUE / right shoulder discomfort secondary to PICC. PICC was removed, new one placed, RUE much better.     LOS: 18 days   [name]  02/10/2012, 7:52 AM   GI ATTENDING  PATIENT SEEN AND EXAMINED. STABLE WITH GRADUAL IMPROVEMENT IN MOST PARAMETERS. PROBLEMS WITH PO INTAKE AS DESCRIBED. AGREE WITH PLANS FOR CT. DISCUSSED WITH PATIENT  Dinia Joynt N. Eda Keys., M.D. [facility] Division of  Gastroenterology

## 2012-02-10 NOTE — Progress Notes (Signed)
UR done. 

## 2012-02-11 ENCOUNTER — Ambulatory Visit: Payer: 59 | Admitting: Internal Medicine

## 2012-02-11 DIAGNOSIS — R633 Feeding difficulties: Secondary | ICD-10-CM

## 2012-02-11 DIAGNOSIS — R933 Abnormal findings on diagnostic imaging of other parts of digestive tract: Secondary | ICD-10-CM

## 2012-02-11 DIAGNOSIS — I82629 Acute embolism and thrombosis of deep veins of unspecified upper extremity: Secondary | ICD-10-CM

## 2012-02-11 LAB — CBC WITH DIFFERENTIAL/PLATELET
Eosinophils Absolute: 0.1 10*3/uL (ref 0.0–0.7)
HCT: 26.4 % — ABNORMAL LOW (ref 36.0–46.0)
Hemoglobin: 8.9 g/dL — ABNORMAL LOW (ref 12.0–15.0)
Lymphs Abs: 1.2 10*3/uL (ref 0.7–4.0)
MCH: 29.9 pg (ref 26.0–34.0)
MCV: 88.6 fL (ref 78.0–100.0)
Monocytes Absolute: 0.6 10*3/uL (ref 0.1–1.0)
Monocytes Relative: 7 % (ref 3–12)
Neutrophils Relative %: 78 % — ABNORMAL HIGH (ref 43–77)
RBC: 2.98 MIL/uL — ABNORMAL LOW (ref 3.87–5.11)

## 2012-02-11 MED ORDER — ONDANSETRON HCL 4 MG/2ML IJ SOLN
4.0000 mg | Freq: Four times a day (QID) | INTRAMUSCULAR | Status: DC
  Administered 2012-02-11 – 2012-02-13 (×11): 4 mg via INTRAVENOUS
  Filled 2012-02-11 (×6): qty 2

## 2012-02-11 MED ORDER — ENOXAPARIN SODIUM 80 MG/0.8ML ~~LOC~~ SOLN
1.0000 mg/kg | Freq: Two times a day (BID) | SUBCUTANEOUS | Status: AC
  Administered 2012-02-11 – 2012-02-19 (×18): 75 mg via SUBCUTANEOUS
  Filled 2012-02-11 (×18): qty 0.8

## 2012-02-11 NOTE — Progress Notes (Signed)
Patient ID: Cassie Berry, female   DOB: 10/13/1965, 46 y.o.   MRN: [id number] [facility] Progress Note  Subjective: Pain has been decreasing,only using Dilaudid a few times per day. She still feel nauseated and 'Full' feeling, no vomiting. She did not try much yesterday- says she is hungry, and would like to try  some soup from home. Has been up walking in halls daily-gets tired very quickly.  New PICC line yesterday- now c/o pain and tenderness at old PICC site which is on the same arm.  Objective:  Vital signs in last 24 hours: Temp:  [99 F (37.2 C)-100.9 F (38.3 C)] 99 F (37.2 C) (07/02 0510) Pulse Rate:  [89-104] 93  (07/02 0510) Resp:  [17-21] 17  (07/02 0806) BP: (105-111)/(62-67) 105/67 mmHg (07/02 0510) SpO2:  [93 %-99 %] 97 % (07/02 0806) FiO2 (%):  [47 %] 47 % (07/02 0806) Last BM Date: 02/07/12 General:   Alert,  Well-developed,    in NAD Heart:  Regular rate and rhythm; no murmurs Pulm;clear bilaterally Abdomen:  Soft, mildly tender upper abdomen and nondistended. Normal bowel sounds, without guarding, and without rebound.   Extremities:  Without edema.- firm cord above old PICC site on RUE Neurologic:  Alert and  oriented x4;  grossly normal neurologically. Psych:  Alert and cooperative. Normal mood and affect.  Intake/Output from previous day: 07/01 0701 - 07/02 0700 In: 680 [P.O.:680] Out: -  Intake/Output this shift:   Assessment / Plan: #1 46 yo female with severe post ERCP pancreatitis-day # 19 hospitalization. Follow up CT yesterday shows several MODERATE ORGANIZED FLUID COLLECTIONS, no definite GOO. No necrosis, Will start Zofran around the clock, encourage frequent small feeding thru the day, and Resource 2 per day. Nutrition is an issue- if she cannot eat full liquids consistently in next few days will need post pyloric feeds #2 Anemia- repeat CBC today #3 R/O RUE DVT- doppler today-     LOS: 19 days   Amy Esterwood  02/11/2012, 8:53  AM  GI ATTENDING  PATIENT SEEN AND EXAMINED. CT AND LABS REVIEWED. AGREE WITH ABOVE. PATIENT WITH POST PRANDIAL PAIN / NAUSEA. SHE IS DISCOURAGED / MILDLY TEARFUL. EXAM IS FAIRLY BENIGN. CT WITH MULTIPLE SMALL AND LARGE FLUID COLLECTIONS. ANEMIA NOTED. AT THIS POINT,WILL NEED TO ADDRESS NUTRITION IF PO INADEQUATE. RECHECK CBC. CONT TO INCREASE ACTIVITY. DISCUSSED WITH HER THAT RECOVERY WILL BE SLOW, BUT OVERALL, SHE IS IMPROVING.  Wilhemina Bonito. Eda Keys., M.D. [facility] Division of Gastroenterology

## 2012-02-11 NOTE — Progress Notes (Signed)
*  Preliminary Results* Right upper extremity venous duplex completed. There is evidence of right upper extremity deep vein thrombosis involving the right subclavian, axillary and brachial veins, and superficial vein thrombosis of the basilic vein surrounding the PICC line.  02/11/2012 1:31 PM  Gertie Fey, RDMS,RDCS

## 2012-02-11 NOTE — Progress Notes (Signed)
ANTICOAGULATION CONSULT NOTE - Initial Consult  Pharmacy Consult for Lovenox Indication: DVT  Allergies  Allergen Reactions  . Macrodantin Rash  . Nitrofurantoin    Patient Measurements: Height: 5\' 4"  (162.6 cm) Weight: 166 lb 0.1 oz (75.3 kg) IBW/kg (Calculated) : 54.7   Vital Signs: Temp: 99 F (37.2 C) (07/02 0510) Temp src: Oral (07/02 0510) BP: 105/67 mmHg (07/02 0510) Pulse Rate: 93  (07/02 0510)  Labs:  Basename 02/11/12 1056  HGB 8.9*  HCT 26.4*  PLT 712*  APTT --  LABPROT --  INR --  HEPARINUNFRC --  CREATININE --  CKTOTAL --  CKMB --  TROPONINI --   Estimated Creatinine Clearance: 88.2 ml/min (by C-G formula based on Cr of 0.46).  Medical History: Past Medical History  Diagnosis Date  . GERD (gastroesophageal reflux disease)   . Anxiety   . Migraine headache   . PONV (postoperative nausea and vomiting)   . Pneumonia     02/2011   Medications:  Prescriptions prior to admission  Medication Sig Dispense Refill  . buPROPion (WELLBUTRIN XL) 300 MG 24 hr tablet Take 300 mg by mouth daily.      . cetirizine (ZYRTEC) 10 MG tablet Take 10 mg by mouth daily as needed. For allergies.      . cholecalciferol (VITAMIN D) 1000 UNITS tablet Take 1,000 Units by mouth daily.      Marland Kitchen esomeprazole (NEXIUM) 40 MG packet Take 40 mg by mouth daily before breakfast.  24 each  0  . ondansetron (ZOFRAN) 4 MG tablet Take 4 mg by mouth every 8 (eight) hours as needed. For nausea.      . promethazine (PHENERGAN) 25 MG tablet Take 25 mg by mouth every 6 (six) hours as needed. For nausea.      . Pyridoxine HCl (VITAMIN B-6 PO) Take 1 tablet by mouth daily.      . vitamin B-12 (CYANOCOBALAMIN) 1000 MCG tablet Take 1,000 mcg by mouth daily.       Scheduled:    . antiseptic oral rinse  15 mL Mouth Rinse q12n4p  . buPROPion  300 mg Oral Daily  . chlorhexidine  15 mL Mouth Rinse BID  . docusate sodium  100 mg Oral Daily  . feeding supplement  1 Container Oral BID BM  .  loratadine  10 mg Oral Daily  . magic mouthwash  5 mL Oral TID  . morphine   Intravenous Q4H  . ondansetron  4 mg Intravenous QID  . pantoprazole (PROTONIX) IV  40 mg Intravenous Q1200  . sodium chloride  10-40 mL Intracatheter Q12H    Assessment:  [name] admitted 6/13 with RUQ pain, underwent ERCP with sphincterotomy and insertion of pancreatic stent then developed pancreatitis with pseudocysts.  Pain and tenderness at old PICC site -> dopplers revealed DVT so Lovenox was started.  Good renal fxn.  Goal of Therapy:  Dose adjusted for weight and renal fxn. Monitor platelets by anticoagulation protocol: Yes   Plan:   Lovenox 75mg  SQ q12h.  F/u daily CBC.  MD: consider adding Coumadin soon if possible.   Charolotte Eke, PharmD, pager [phone]. 02/11/2012,1:04 PM.

## 2012-02-12 DIAGNOSIS — I82621 Acute embolism and thrombosis of deep veins of right upper extremity: Secondary | ICD-10-CM

## 2012-02-12 LAB — CBC
HCT: 25.3 % — ABNORMAL LOW (ref 36.0–46.0)
Hemoglobin: 8.6 g/dL — ABNORMAL LOW (ref 12.0–15.0)
MCH: 30.2 pg (ref 26.0–34.0)
MCHC: 34 g/dL (ref 30.0–36.0)
MCV: 88.8 fL (ref 78.0–100.0)
Platelets: 653 K/uL — ABNORMAL HIGH (ref 150–400)
RBC: 2.85 MIL/uL — ABNORMAL LOW (ref 3.87–5.11)
RDW: 13.3 % (ref 11.5–15.5)
WBC: 7.7 K/uL (ref 4.0–10.5)

## 2012-02-12 LAB — PROTIME-INR
INR: 1.34 (ref 0.00–1.49)
Prothrombin Time: 16.8 seconds — ABNORMAL HIGH (ref 11.6–15.2)

## 2012-02-12 NOTE — Progress Notes (Signed)
Nutrition Follow-up  Diet Order: Full liquid, Resource Breeze BID   - Pt reports she has been trying to eat bland food every 2-3 hours. She reports nausea improving, however it is worse in the morning. Pt states she has had her sister bring in smoothies, which she enjoys. Pt states so far today she has been sipping on broth and juice and states she cannot drink more than a half a cup of broth without getting full. Pt state she drank 100% of 1 Resource Breeze yesterday. Pt states she feels like she is getting stronger and walked the halls this morning. Pt denies any vomiting today, only "tiny" bit of dry heaves this morning. Pt aware of recommended foods for pancreatitis from previous RD visit and education. Last BM was documented on 02/08/12, pt may need bowel regimen to help with this. If pt unable to meet nutritional needs orally, agree with MD GI recommendations yesterday for post-pyloric tube feedings.   Intervention: Educated pt on what a calorie count is and how nursing will record meal/beverage/supplement intakes for the next 3 days. Education nursing on calorie count and hung envelope on pt's door. Encouraged increased meal and supplement intake as tolerated.   Meds: Scheduled Meds:   . buPROPion  300 mg Oral Daily  . chlorhexidine  15 mL Mouth Rinse BID  . docusate sodium  100 mg Oral Daily  . enoxaparin (LOVENOX) injection  1 mg/kg Subcutaneous BID  . feeding supplement  1 Container Oral BID BM  . loratadine  10 mg Oral Daily  . magic mouthwash  5 mL Oral TID  . morphine   Intravenous Q4H  . ondansetron  4 mg Intravenous QID  . pantoprazole (PROTONIX) IV  40 mg Intravenous Q1200  . sodium chloride  10-40 mL Intracatheter Q12H  . DISCONTD: antiseptic oral rinse  15 mL Mouth Rinse q12n4p   Continuous Infusions:   . dextrose 5 % and 0.9 % NaCl with KCl 40 mEq/L 75 mL/hr at 02/12/12 0706   PRN Meds:.acetaminophen, diphenhydrAMINE, diphenhydrAMINE, HYDROmorphone, hyoscyamine,  LORazepam, meperidine (DEMEROL) injection, naloxone, ondansetron (ZOFRAN) IV, ondansetron, promethazine, promethazine, promethazine, sodium chloride, sodium chloride  Labs:  CMP     Component Value Date/Time   NA 133* 02/08/2012 0633   K 4.0 02/08/2012 0633   CL 99 02/08/2012 0633   CO2 25 02/08/2012 0633   GLUCOSE 106* 02/08/2012 0633   BUN 5* 02/08/2012 0633   CREATININE 0.46* 02/08/2012 0633   CALCIUM 8.8 02/08/2012 0633   PROT 6.5 02/08/2012 0633   ALBUMIN 2.5* 02/08/2012 0633   AST 22 02/08/2012 0633   ALT 24 02/08/2012 0633   ALKPHOS 106 02/08/2012 0633   BILITOT 0.3 02/08/2012 0633   GFRNONAA >90 02/08/2012 0633   GFRAA >90 02/08/2012 7846     Intake/Output Summary (Last 24 hours) at 02/12/12 1303 Last data filed at 02/12/12 0900  Gross per 24 hour  Intake   1535 ml  Output   2200 ml  Net   -665 ml   Last BM - 02/08/12  Weight Status:  No new weights  Re-estimated needs:   1900-2200 calories 110-120g protein  Nutrition Dx: Inadequate oral intake - gradually improving  Goal: Advance diet as tolerated - not met, on full liquids  New goal: Pt to consume >90% of meals/supplements  Monitor: Weights, labs, intake, calorie count  Marshall Cork Pager: [phone]

## 2012-02-12 NOTE — Progress Notes (Signed)
Patient ID: Cassie Berry, female   DOB: 1965/11/23, 46 y.o.   MRN: [id number] [facility] Progress Note  Subjective: Doing well-tolerating full liquids if has small amts thru the day- had soup and a smoothie from home yesterday. Not using much pain medicine,walking several times per day.  Right upper arm still tender RUE Doppler- Positive for DVT involving Right subclavian vein,axillary vein and brachial- Lovenox per pharmacy started 7 /2  Report also mentions some thrombosis involving the right basilic where the new PICC is.  Objective:  Vital signs in last 24 hours: Temp:  [98.9 F (37.2 C)-99.7 F (37.6 C)] 99 F (37.2 C) (07/03 0500) Pulse Rate:  [91-92] 92  (07/03 0500) Resp:  [15-20] 15  (07/03 0500) BP: (108-111)/(69-71) 108/69 mmHg (07/03 0500) SpO2:  [93 %-97 %] 95 % (07/03 0500) FiO2 (%):  [91 %-97 %] 95 % (07/03 0812) Last BM Date: 02/08/12 General:   Alert,  Well-developed,    in NAD Heart:  Regular rate and rhythm; no murmurs Pulm;clear Abdomen:  Soft, tender and nondistended. Normal bowel sounds, without guarding, and without rebound.   Extremities:  Without edema. No swelling of RUE,tender and slightly firm around old PICC site Neurologic:  Alert and  oriented x4;  grossly normal neurologically. Psych:  Alert and cooperative. Normal mood and affect.  Intake/Output from previous day: 07/02 0701 - 07/03 0700 In: 1655 [P.O.:710; I.V.:945] Out: 1600 [Urine:1600] Intake/Output this shift:    Lab Results:  Basename 02/12/12 0620 02/11/12 1056  WBC 7.7 8.4  HGB 8.6* 8.9*  HCT 25.3* 26.4*  PLT 653* 712*      Assessment / Plan: #1  46 yo female with severe post ERCP pancreatitis-improving very gradually. Temp curve down,off ABX, normal WBC #2 multiple Pseudocysts- follow #3 nutrition-pre-albumin is 10.  She is able to eat full liquids so pushing small feedings every couple hours and food from home to stimulate appetite- will check calorie counts  next few days-trying toavoid TNA #4 RUE DVT- at old PICC site- Lovenox started yesterday- have asked vascular surg to see to help guide length of Rx with anticoagulation, follow up etc. Also do we need to remove the new PICC if there is some thrombosis of the right basilic?? At this point she is only using the IV for pain meds,Zofran #5 Anemia- multifactoral- stable Active Problems:  Acute pancreatitis  Bilateral pleural effusion  Fever  Hyponatremia  Anemia  Feeding difficulties  Nonspecific (abnormal) findings on radiological and other examination of gastrointestinal tract     LOS: 20 days   Amy Esterwood  02/12/2012, 8:37 AM    GI ATTENDING  Patient seen and examined. Agree with above. Results of upper extremity Doppler noted. Appreciate and have initiated vascular surgery recommendations. Overall she is stable to slightly better. Tolerating diet a bit better. Zofran helps. Physical exam is unremarkable. She continues to be active. We will continue with supportive measures. Agree with calorie count. Hopefully can avoid enteral feedings.  Wilhemina Bonito. Eda Keys., M.D. [facility] Division of Gastroenterology

## 2012-02-12 NOTE — Consult Note (Signed)
[facility] [place]  Referred by:  Dr. Marina Goodell  Reason for referral: Right arm thrombus  History of Present Illness  Cassie Berry is a 46 y.o. (02-Dec-1965) female who presents with chief complaint: abdominal pain with eating.  Patient was admitted to the hospital after a ERCP with sphincterotomy and stent placement (01/23/12).  She developed acute severe pancreatitis after that procedure and had a PICC placed in the right basilic vein.  Apparently she developed some superficial thrombophlebitis at the basilic vein insertion site.  This PICC was removed and the left arm was interrogated for PICC placement, but no adequate veins were visualized.  Subsequently a R cephalic vein PICC was placed.  The pt notes some shoulder discomfort from that catheter placement but denies any shortness of breath at this point.  Patient has no previous DVT history or PE history.  Past Medical History  Diagnosis Date  . GERD (gastroesophageal reflux disease)   . Anxiety   . Migraine headache   . PONV (postoperative nausea and vomiting)   . Pneumonia     02/2011    Past Surgical History  Procedure Date  . Appendectomy   . Cholecystectomy   . Ercp 01/23/2012    Procedure: ENDOSCOPIC RETROGRADE CHOLANGIOPANCREATOGRAPHY (ERCP);  Surgeon: Louis Meckel, MD;  Location: [facility];  Service: Endoscopy;  Laterality: N/A;  . Sphincterotomy 01/23/2012    Procedure: SPHINCTEROTOMY;  Surgeon: Louis Meckel, MD;  Location: [facility];  Service: Endoscopy;  Laterality: N/A;  . Esophagogastroduodenoscopy 01/25/2012    Procedure: ESOPHAGOGASTRODUODENOSCOPY (EGD);  Surgeon: Rachael Fee, MD;  Location: [facility];  Service: Endoscopy;  Laterality: N/A;    History   Social History  . Marital Status: Married    Spouse Name: N/A    Number of Children: N/A  . Years of Education: N/A   Occupational History  . Not on file.   Social History Main Topics  . Smoking status: Former Games developer    . Smokeless tobacco: Never Used  . Alcohol Use: Yes     Occasional  . Drug Use: No  . Sexually Active: Not on file   Other Topics Concern  . Not on file   Social History Narrative   Daily caffeine    Family History  Problem Relation Age of Onset  . Colon cancer Cousin     Paternal side  . Colon cancer Paternal Grandfather   . Lung cancer Mother   . Diabetes Father   . Heart disease Father   . Prostate cancer      grandfather  . Breast cancer Maternal Aunt     No current facility-administered medications on file prior to encounter.   Current Outpatient Prescriptions on File Prior to Encounter  Medication Sig Dispense Refill  . buPROPion (WELLBUTRIN XL) 300 MG 24 hr tablet Take 300 mg by mouth daily.      . cholecalciferol (VITAMIN D) 1000 UNITS tablet Take 1,000 Units by mouth daily.      Marland Kitchen esomeprazole (NEXIUM) 40 MG packet Take 40 mg by mouth daily before breakfast.  24 each  0  . Pyridoxine HCl (VITAMIN B-6 PO) Take 1 tablet by mouth daily.      . vitamin B-12 (CYANOCOBALAMIN) 1000 MCG tablet Take 1,000 mcg by mouth daily.        Allergies  Allergen Reactions  . Macrodantin Rash  . Nitrofurantoin     Review of Systems (Positive items checked otherwise negative)  General: [ ]   Weight loss, [ ]  Weight gain, [ ]   Loss of appetite, [x]  Fever  Neurologic: [ ]  Dizziness, [ ]  Blackouts, [ ]  Headaches, [ ]  Seizure  Ear/Nose/Throat: [ ]  Change in eyesight, [ ]  Change in hearing, [ ]  Nose bleeds, [ ]  Sore throat  Vascular: [ ]  Pain in legs with walking, [ ]  Pain in feet while lying flat, [ ]  Non-healing ulcer, [ ]  Stroke, [ ]  "Mini stroke", [ ]  Slurred speech, [ ]  Temporary blindness, [x]  Blood clot in vein, [ ]  Phlebitis  Pulmonary: [ ]  Home oxygen, [ ]  Productive cough, [ ]  Bronchitis, [ ]  Coughing up blood, [ ]  Asthma, [ ]  Wheezing  Musculoskeletal: [ ]  Arthritis, [ ]  Joint pain, [ ]  Muscle pain  Cardiac: [ ]  Chest pain, [ ]  Chest tightness/pressure, [ ]  Shortness  of breath when lying flat, [ ]  Shortness of breath with exertion, [ ]  Palpitations, [ ]  Heart murmur, [ ]  Arrythmia, [ ]  Atrial fibrillation  Hematologic: [ ]  Bleeding problems, [ ]  Clotting disorder, [ ]  Anemia  Psychiatric:  [ ]  Depression, [ ]  Anxiety, [ ]  Attention deficit disorder  Gastrointestinal:  [ ]  Black stool,[ ]   Blood in stool, [ ]  Peptic ulcer disease, [ ]  Reflux, [ ]  Hiatal hernia, [ ]  Trouble swallowing, [ ]  Diarrhea, [ ]  Constipation, [x]  abdominal pain with eating  Urinary:  [ ]  Kidney disease, [ ]  Burning with urination, [ ]  Frequent urination, [ ]  Difficulty urinating  Skin: [ ]  Ulcers, [ ]  Rashes   Physical Examination  Filed Vitals:   02/11/12 2345 02/12/12 0346 02/12/12 0500 02/12/12 0956  BP:   108/69   Pulse:   92   Temp:   99 F (37.2 C)   TempSrc:   Oral   Resp: 15 18 15 16   Height:      Weight:      SpO2: 96% 96% 95% 96%   Body mass index is 28.49 kg/(m^2).  General: A&O x 3, WDWN  Head: [place]/AT  Ear/Nose/Throat: Hearing grossly intact, nares w/o erythema or drainage, oropharynx w/o Erythema/Exudate, [place] oxygen in nares but tubing not connected to wall oxygen  Eyes: PERRLA, EOMI  Neck: Supple, no nuchal rigidity, no palpable LAD  Pulmonary: Sym exp, good air movt, CTAB, no rales, rhonchi, & wheezing, no chest wall vein engorgement  Cardiac: RRR, Nl S1, S2, no Murmurs, rubs or gallops  Vascular: Vessel Right Left  Radial Palpable Palpable  Brachial Palpable Palpable  Carotid Palpable, without bruit Palpable, without bruit  Aorta Non-palpable N/A  Femoral Palpable Palpable  Popliteal Non-palpable Non-palpable  PT Palpable Palpable  DP Palpable Palpable   Gastrointestinal: soft, NTND, -G/R, - HSM, - masses, - CVAT B  Musculoskeletal: Moving all extremities spontaneously , Extremities without ischemic changes , mild amount of erythema near previous PICC insertion site, no significant cord palpable at this point  Neurologic: Grossly  cranial nerves , Pain and light touch intact in extremities , Motor exam as listed above  Psychiatric: Judgment intact, Mood & affect appropriate for pt's clinical situation  Dermatologic: See M/S exam for extremity exam, no rashes otherwise noted  Lymph : No Cervical, Axillary, or Inguinal lymphadenopathy   Laboratory: CBC:    Component Value Date/Time   WBC 7.7 02/12/2012 0620   RBC 2.85* 02/12/2012 0620   HGB 8.6* 02/12/2012 0620   HCT 25.3* 02/12/2012 0620   PLT 653* 02/12/2012 0620   MCV 88.8 02/12/2012 0620   MCH 30.2 02/12/2012  0620   MCHC 34.0 02/12/2012 0620   RDW 13.3 02/12/2012 0620   LYMPHSABS 1.2 02/11/2012 1056   MONOABS 0.6 02/11/2012 1056   EOSABS 0.1 02/11/2012 1056   BASOSABS 0.0 02/11/2012 1056    BMP:    Component Value Date/Time   NA 133* 02/08/2012 0633   K 4.0 02/08/2012 0633   CL 99 02/08/2012 0633   CO2 25 02/08/2012 0633   GLUCOSE 106* 02/08/2012 0633   BUN 5* 02/08/2012 0633   CREATININE 0.46* 02/08/2012 0633   CALCIUM 8.8 02/08/2012 0633   GFRNONAA >90 02/08/2012 0633   GFRAA >90 02/08/2012 1610    Coagulation: No results found for this basename: INR, PROTIME   No results found for this basename: PTT   Non-invasive Vascular Imaging RUE venous duplex:  I finalized the read on this study: +DVT involving axillary and distal subclavian vein, proximal extent appears patent on my review of the exam  RIJV appears patent  Innominate vein not interrogated  Basilic vein thrombus evident: consistent with superficial vein thrombosis  Medical Decision Making  SUJEY GUNDRY is a 46 y.o. female who presents with: acute severe pancreatitis with complications s/p ERCP, R axillary and subclavian vein DVT s/p PICC placement   The presence of RUE DVT is a risk for possible pulmonary embolism, so anticoagulation is recommended.  Given she may be at risk for a bleeding complication related to her pancreatitis and she is a difficult blood draw, I don't think it is unreasonable to  continue weight based full-therapeutic Lovenox without immediately starting Coumadin.  Once she had demonstrated no complications from anticoagulation, starting Coumadin for a 3-6 month period is reasonable.  I do not anticipate long-term Coumadin use as this is a known complication of central venous catheter placement.  I would repeat the right upper extremity venous duplex in 3 months, and if all thrombus has lysed at that point, it would be reasonable to end the Coumadin.    In regards to the PICC, I would recommend discontinuing this as this will allow some resumption of blood flow in the veins with thrombus.  This would facilitate lysis of the thrombus load in this right upper extremity.  There is NO role for pharmacomechanical thrombolysis or SVC filter placement in this patient.  Thank you for allowing [place] to participate in this patient's care.  She can follow up with [place] in the office in 3 months ([phone]).  Leonides Sake, MD [facility] [place] Office: [phone] Pager: [phone]  02/12/2012, 11:44 AM

## 2012-02-13 LAB — CBC
MCHC: 32.9 g/dL (ref 30.0–36.0)
Platelets: 587 10*3/uL — ABNORMAL HIGH (ref 150–400)
RDW: 13.2 % (ref 11.5–15.5)
WBC: 6.5 10*3/uL (ref 4.0–10.5)

## 2012-02-13 NOTE — Progress Notes (Signed)
Patient ID: Cassie Berry, female   DOB: 1966/05/09, 46 y.o.   MRN: [id number] [facility] Progress Note  Subjective: Trying to eat - still not able to take much at a time, periods of nausea and pain.. She had one temp spike last pm 101.7. Feels good this am.  Objective:  Vital signs in last 24 hours: Temp:  [98.4 F (36.9 C)-101.7 F (38.7 C)] 98.4 F (36.9 C) (07/04 0510) Pulse Rate:  [91-99] 91  (07/04 0510) Resp:  [14-22] 14  (07/04 0510) BP: (100-120)/(64-75) 100/64 mmHg (07/04 0510) SpO2:  [95 %-98 %] 97 % (07/04 0510) Last BM Date: 02/08/12 General:   Alert,  Well-developed,    in NAD Heart:  Regular rate and rhythm; no murmurs Pulm;clear Abdomen:  Soft, tender epigastrium, and nondistended. Normal bowel sounds, without guarding, and without rebound.   Extremities:  Without edema. Neurologic:  Alert and  oriented x4;  grossly normal neurologically. Psych:  Alert and cooperative. Normal mood and affect.  Intake/Output from previous day: 07/03 0701 - 07/04 0700 In: 1904.5 [P.O.:600; I.V.:1304.5] Out: 2150 [Urine:2150] Intake/Output this shift:    Lab Results:  Basename 02/13/12 0310 02/12/12 0620 02/11/12 1056  WBC 6.5 7.7 8.4  HGB 8.4* 8.6* 8.9*  HCT 25.5* 25.3* 26.4*  PLT 587* 653* 712*    PT/INR  Basename 02/12/12 1621  LABPROT 16.8*  INR 1.34    Assessment / Plan: #1  46 yo female  With severe post ERCP pancreatitis- gradual improvement. Still having trouble eating- continue full liquids, calorie counts in progress, hope to convert to oral analgesics, antiemetics soon. #2 multiple organized fluid collections-following #3 anemia-multifactoral #4 RUE DVT- appreciate vascular advice- PICC is out, on Lovenox #5. Fever. Normal white blood cell count  Active Problems:  Acute pancreatitis  Bilateral pleural effusion  Fever  Hyponatremia  Anemia  Feeding difficulties  Nonspecific (abnormal) findings on radiological and other examination of  gastrointestinal tract  Deep vein thrombosis, upper right extremity     LOS: 21 days   Amy Esterwood  02/13/2012, 9:39 AM  GI ATTENDING  Patient seen and examined. States she is feeling better. Improved oral intake. Still with some discomfort upon eating. Arm is feeling better though still tender to touch. Fever noted. May be due to DVT. Could be due to resolving pancreatitis. No pulmonary complaints or urinary complaints. Exam is benign. Looks better. Continues on Lovenox for DVT. Regular IV in left hand. Continue with current care plan without change.  Wilhemina Bonito. Eda Keys., M.D. [facility] Division of Gastroenterology

## 2012-02-14 LAB — CBC
Hemoglobin: 8.4 g/dL — ABNORMAL LOW (ref 12.0–15.0)
MCH: 29.1 pg (ref 26.0–34.0)
Platelets: 588 10*3/uL — ABNORMAL HIGH (ref 150–400)
RBC: 2.89 MIL/uL — ABNORMAL LOW (ref 3.87–5.11)
WBC: 8 10*3/uL (ref 4.0–10.5)

## 2012-02-14 MED ORDER — ONDANSETRON HCL 4 MG/2ML IJ SOLN
4.0000 mg | Freq: Three times a day (TID) | INTRAMUSCULAR | Status: DC
  Administered 2012-02-14 – 2012-02-28 (×29): 4 mg via INTRAVENOUS
  Filled 2012-02-14 (×30): qty 2

## 2012-02-14 MED ORDER — TRAMADOL HCL 50 MG PO TABS
50.0000 mg | ORAL_TABLET | Freq: Four times a day (QID) | ORAL | Status: DC
  Administered 2012-02-14 – 2012-02-15 (×4): 50 mg via ORAL
  Filled 2012-02-14 (×8): qty 1

## 2012-02-14 MED ORDER — ENSURE COMPLETE PO LIQD
237.0000 mL | Freq: Two times a day (BID) | ORAL | Status: DC
  Administered 2012-02-14 – 2012-02-21 (×8): 237 mL via ORAL

## 2012-02-14 MED ORDER — PANTOPRAZOLE SODIUM 40 MG PO TBEC
40.0000 mg | DELAYED_RELEASE_TABLET | Freq: Every day | ORAL | Status: DC
  Administered 2012-02-14 – 2012-02-19 (×6): 40 mg via ORAL
  Filled 2012-02-14 (×8): qty 1

## 2012-02-14 NOTE — Progress Notes (Signed)
Patient ID: Cassie Berry, female   DOB: 05/11/1966, 46 y.o.   MRN: [id number] [facility] Progress Note  Subjective: Not having a good day today,did not sleep well last night, had abdominal pain .eating a little, some nausea says pain 5/10 at worst . Frustrated. Iv came out again.  Objective:  Vital signs in last 24 hours: Temp:  [98.1 F (36.7 C)-99.8 F (37.7 C)] 98.9 F (37.2 C) (07/05 0510) Pulse Rate:  [90-97] 97  (07/05 0510) Resp:  [15-20] 16  (07/05 0823) BP: (104-122)/(68-74) 104/68 mmHg (07/05 0510) SpO2:  [94 %-98 %] 94 % (07/05 0823) FiO2 (%):  [100 %] 100 % (07/05 0823) Last BM Date: 02/08/12 General:   Alert,  Well-developed,    in NAD Heart:  Regular rate and rhythm; no murmurs Pulm;clear Abdomen:  Soft, tender and nondistended. Normal bowel sounds, without guarding, and without rebound.   Extremities:  Without edema. Neurologic:  Alert and  oriented x4;  grossly normal neurologically. Psych:  Alert and cooperative. Normal mood and affect.  Intake/Output from previous day: 07/04 0701 - 07/05 0700 In: 2790 [P.O.:720; I.V.:2060; IV Piggyback:10] Out: 2850 [Urine:2850] Intake/Output this shift: Total I/O In: -  Out: 800 [Urine:800]  Lab Results:  Basename 02/14/12 0400 02/13/12 0310 02/12/12 0620  WBC 8.0 6.5 7.7  HGB 8.4* 8.4* 8.6*  HCT 25.5* 25.5* 25.3*  PLT 588* 587* 653*     PT/INR  Basename 02/12/12 1621  LABPROT 16.8*  INR 1.34     Assessment / Plan: #1 46  yo female  With severe pancreatitis post ERCP. Day # 21 hospital stay. Multiple pseudocyts, and persistent difficulty with po intake Will restart  peripheral IV, converted protonix to po, will start Ultram scheduled doses and see if will be able to get off Morphine.. On Zofran around the clock Continue full liquids and calorie counts Hope can get her out of hospital soon.  #2 RUE DVT- on lovenox Active Problems:  Acute pancreatitis  Bilateral pleural effusion  Fever  Hyponatremia  Anemia  Feeding difficulties  Nonspecific (abnormal) findings on radiological and other examination of gastrointestinal tract  Deep vein thrombosis, upper right extremity     LOS: 22 days   Amy Esterwood  02/14/2012, 10:54 AM    GI ATTENDING  Continues to make slow progress. Doing a little better this afternoon. Eating a peach. No new problems. No fevers. Continues to ambulate. Exam is benign. Continue with current care plan. Overtly she can increase her by mouth intake without discomfort or nausea. Medications adjusted as noted above. After taking adequate by mouth's, we will start Coumadin in anticipation of discharge. However, clearly not ready at this point.  Wilhemina Bonito. Eda Keys., M.D. [facility] Division of Gastroenterology

## 2012-02-14 NOTE — Progress Notes (Addendum)
ANTICOAGULATION CONSULT NOTE - follow up  Pharmacy Consult for Lovenox Indication: DVT  Allergies  Allergen Reactions  . Macrodantin Rash  . Nitrofurantoin    Patient Measurements: Height: 5\' 4"  (162.6 cm) Weight: 166 lb 0.1 oz (75.3 kg) IBW/kg (Calculated) : 54.7   Vital Signs: Temp: 98.9 F (37.2 C) (07/05 0510) Temp src: Oral (07/05 0510) BP: 104/68 mmHg (07/05 0510) Pulse Rate: 97  (07/05 0510)  Labs:  Basename 02/14/12 0400 02/13/12 0310 02/12/12 1621 02/12/12 0620  HGB 8.4* 8.4* -- --  HCT 25.5* 25.5* -- 25.3*  PLT 588* 587* -- 653*  APTT -- -- -- --  LABPROT -- -- 16.8* --  INR -- -- 1.34 --  HEPARINUNFRC -- -- -- --  CREATININE -- -- -- --  CKTOTAL -- -- -- --  CKMB -- -- -- --  TROPONINI -- -- -- --   Estimated Creatinine Clearance: 88.2 ml/min (by C-G formula based on Cr of 0.46).  Medical History: Past Medical History  Diagnosis Date  . GERD (gastroesophageal reflux disease)   . Anxiety   . Migraine headache   . PONV (postoperative nausea and vomiting)   . Pneumonia     02/2011   Medications:  Prescriptions prior to admission  Medication Sig Dispense Refill  . buPROPion (WELLBUTRIN XL) 300 MG 24 hr tablet Take 300 mg by mouth daily.      . cetirizine (ZYRTEC) 10 MG tablet Take 10 mg by mouth daily as needed. For allergies.      . cholecalciferol (VITAMIN D) 1000 UNITS tablet Take 1,000 Units by mouth daily.      Marland Kitchen esomeprazole (NEXIUM) 40 MG packet Take 40 mg by mouth daily before breakfast.  24 each  0  . ondansetron (ZOFRAN) 4 MG tablet Take 4 mg by mouth every 8 (eight) hours as needed. For nausea.      . promethazine (PHENERGAN) 25 MG tablet Take 25 mg by mouth every 6 (six) hours as needed. For nausea.      . Pyridoxine HCl (VITAMIN B-6 PO) Take 1 tablet by mouth daily.      . vitamin B-12 (CYANOCOBALAMIN) 1000 MCG tablet Take 1,000 mcg by mouth daily.       Scheduled:     . buPROPion  300 mg Oral Daily  . chlorhexidine  15 mL Mouth  Rinse BID  . docusate sodium  100 mg Oral Daily  . enoxaparin (LOVENOX) injection  1 mg/kg Subcutaneous BID  . feeding supplement  1 Container Oral BID BM  . loratadine  10 mg Oral Daily  . morphine   Intravenous Q4H  . ondansetron  4 mg Intravenous QID  . pantoprazole (PROTONIX) IV  40 mg Intravenous Q1200  . sodium chloride  10-40 mL Intracatheter Q12H  . DISCONTD: magic mouthwash  5 mL Oral TID    Assessment:  [name] admitted 6/13 with RUQ pain, underwent ERCP with sphincterotomy and insertion of pancreatic stent then developed pancreatitis with pseudocysts.  Pain and tenderness at old PICC site -> dopplers revealed DVT so Lovenox was started 7/2.  Renal fx wnl/stable.   No bleeding reported. H/H low but stable. Pltc high/stable  Goal of Therapy:  Dose adjusted for weight and renal fxn. Monitor platelets by anticoagulation protocol: Yes   Plan:   Continue Lovenox 75mg  SQ q12h.  F/u daily CBC.  Noted plans for Coumadin when no evidence of bleeding risk  Gwen Her PharmD  [phone] 02/14/2012 8:15 AM

## 2012-02-14 NOTE — Progress Notes (Signed)
Calorie Count Note  72 hour calorie count ordered.  Diet: Full liquid Supplements: Resource Breeze BID  7/3 Lunch: 65 calories, 1g protein Dinner: 0 calories, 0g protein Supplements: 250 calories, 9g protein (1 Resource Breeze)  7/4  Breakfast: 350 calories, 11g protein Lunch: 200 calories, 12g protein (1/2 of blueberry smoothie) Dinner: 300 calories, 15g protein Supplements: 250 calories, 9g protein (1 Resource Breeze)  7/5  Breakfast: 350 calories, 12g protein  Total intake: 708 kcal (37% of minimum estimated needs)  29g protein (26% of minimum estimated needs)  Nutrition Dx: Inadequate oral intake - ongoing  Goal: Pt to consume >90% of meals/supplements - not met, meal intake has been 0-25% with typically only 1 supplement consumed daily  Intervention: Continue calorie count until Monday. Change supplement to Ensure Complete as pt c/o Resource breeze hurting her stomach.   - Pt reports she has hungry yesterday and started to eat well, however pt started hurting after eating cream of potato soup last night. Pt states she doesn't want anything on her stomach today and has pain with eating. Pt recently got Phenergan for nausea. Pt frustrated with lack of progress with nutrition, however was told by MD that food aversions are common.   Dietitian# [phone]

## 2012-02-14 NOTE — Progress Notes (Signed)
Pharmacy: IV to PO Protonix  Patient has been receiving IV Protonix. Per Pharmacy and Therapeutics Committee, this patient meets criteria for auto conversion to PO Protonix:   Tolerating a diet of full liquids or better  Tolerating other medications via enteral route  No GIB  Chamaine Stankus PharmD  [phone] 09/09/2011 8:54 AM    

## 2012-02-15 LAB — CBC
HCT: 25.6 % — ABNORMAL LOW (ref 36.0–46.0)
MCV: 88 fL (ref 78.0–100.0)
RBC: 2.91 MIL/uL — ABNORMAL LOW (ref 3.87–5.11)
RDW: 13.2 % (ref 11.5–15.5)
WBC: 8.3 10*3/uL (ref 4.0–10.5)

## 2012-02-15 MED ORDER — MORPHINE SULFATE ER 15 MG PO TBCR
15.0000 mg | EXTENDED_RELEASE_TABLET | Freq: Two times a day (BID) | ORAL | Status: DC
  Administered 2012-02-15 (×2): 15 mg via ORAL
  Filled 2012-02-15 (×2): qty 1

## 2012-02-15 MED ORDER — ONDANSETRON HCL 4 MG PO TABS
4.0000 mg | ORAL_TABLET | Freq: Four times a day (QID) | ORAL | Status: DC | PRN
  Administered 2012-02-15 – 2012-02-19 (×12): 4 mg via ORAL
  Filled 2012-02-15 (×11): qty 1

## 2012-02-15 MED ORDER — WARFARIN - PHARMACIST DOSING INPATIENT
Freq: Every day | Status: DC
  Administered 2012-02-18 – 2012-03-01 (×6)

## 2012-02-15 MED ORDER — WARFARIN SODIUM 7.5 MG PO TABS
7.5000 mg | ORAL_TABLET | Freq: Once | ORAL | Status: AC
  Administered 2012-02-15: 7.5 mg via ORAL
  Filled 2012-02-15: qty 1

## 2012-02-15 NOTE — Progress Notes (Signed)
Patient ID: Cassie Berry, female   DOB: Feb 18, 1966, 46 y.o.   MRN: [id number] [facility] Progress Note  Subjective: Better day yesterday, and feeling pretty good this am- no nausea, Zofran working well. Ultram is not working for pain-she says she really does not have any ongoing pain-just when she eats. Better po intake, and now drinking Ensure  Objective:  Vital signs in last 24 hours: Temp:  [98.6 F (37 C)-99.5 F (37.5 C)] 99.5 F (37.5 C) (07/06 0624) Pulse Rate:  [82-94] 94  (07/06 0624) Resp:  [17-25] 17  (07/06 0819) BP: (97-105)/(54-69) 105/69 mmHg (07/06 0624) SpO2:  [94 %-98 %] 96 % (07/06 0819) FiO2 (%):  [83 %-97 %] 83 % (07/06 0819) Last BM Date: 02/14/12 General:   Alert,  Well-developed,    in NAD Heart:  Regular rate and rhythm; no murmurs Pulm;clear Abdomen:  Soft, tender epigastrium and upper abdomen and nondistended. Normal bowel sounds, without guarding, and without rebound.   Extremities:  Without edema. Neurologic:  Alert and  oriented x4;  grossly normal neurologically. Psych:  Alert and cooperative. Normal mood and affect.  Intake/Output from previous day: 07/05 0701 - 07/06 0700 In: 1700 [P.O.:800; I.V.:900] Out: 3050 [Urine:3050] Intake/Output this shift:    Lab Results:  Basename 02/15/12 0441 02/14/12 0400 02/13/12 0310  WBC 8.3 8.0 6.5  HGB 8.5* 8.4* 8.4*  HCT 25.6* 25.5* 25.5*  PLT 551* 588* 587*   BMET No results found for this basename: NA:3,K:3,CL:3,CO2:3,GLUCOSE:3,BUN:3,CREATININE:3,CALCIUM:3 in the last 72 hours LFT No results found for this basename: PROT,ALBUMIN,AST,ALT,ALKPHOS,BILITOT,BILIDIR,IBILI in the last 72 hours PT/INR  Basename 02/12/12 1621  LABPROT 16.8*  INR 1.34    Assessment / Plan: 46 yo female with  Severe post ERCP pancreatitis,and multiple fluid collections She is stable, working on gradually increasing po intake Continue round the clock Zofran Will switch pain med to MS contin 15 mg q12 ,  D/c ultram She is having 1-2 peripheral IV placed daily- trying to get all meds switched to po  #2 RUE DVT - On lovenox- will ask pharmacy to start coumadin today #3 anemia- stable    LOS: 23 days   Amy Esterwood  02/15/2012, 9:45 AM    GI ATTENDING  Agree with assessment and plan as outlined above. Patient states she is doing a little better today. She has a friend in the room visiting. Physical exam is unremarkable except for tenderness in the right upper arm in the area of thrombosis. Continue to advance diet as tolerated. She feels like the morphine is helping. Conversion from Lovenox to Coumadin underway.  Wilhemina Bonito. Eda Keys., M.D. [facility] Division of Gastroenterology

## 2012-02-15 NOTE — Progress Notes (Signed)
Pt is doing  Better today, states pain has improved , pt rates her pain a 3 out of 10 on a scale of 1-10. Pt states she has not had a  Need for her PCA.  Pt states her nausea is precipitated with intake of food but the PO Zofran is effective . RN concerned about the number of IV restart pt has requested in a 12 hr period , pt has had 2 iv restart and has just requested another one due to IV site been tender. RN notified MD oncall for DR Arlyce Dice . Amy returned call and placed an order for IV to remain out but restarted if PO pain medication is not effective .  RN will continue to monitor pt.

## 2012-02-15 NOTE — Progress Notes (Signed)
ANTICOAGULATION CONSULT NOTE - follow up  Pharmacy Consult for Lovenox & Coumadin Indication: DVT  Allergies  Allergen Reactions  . Macrodantin Rash  . Nitrofurantoin    Patient Measurements: Height: 5\' 4"  (162.6 cm) Weight: 166 lb 0.1 oz (75.3 kg) IBW/kg (Calculated) : 54.7   Vital Signs: Temp: 99.1 F (37.3 C) (07/06 1415) Temp src: Oral (07/06 1415) BP: 116/77 mmHg (07/06 1415) Pulse Rate: 91  (07/06 1415)  Labs:  Basename 02/15/12 0441 02/14/12 0400 02/13/12 0310 02/12/12 1621  HGB 8.5* 8.4* -- --  HCT 25.6* 25.5* 25.5* --  PLT 551* 588* 587* --  APTT -- -- -- --  LABPROT -- -- -- 16.8*  INR -- -- -- 1.34  HEPARINUNFRC -- -- -- --  CREATININE -- -- -- --  CKTOTAL -- -- -- --  CKMB -- -- -- --  TROPONINI -- -- -- --   Estimated Creatinine Clearance: 88.2 ml/min (by C-G formula based on Cr of 0.46).  Medical History: Past Medical History  Diagnosis Date  . GERD (gastroesophageal reflux disease)   . Anxiety   . Migraine headache   . PONV (postoperative nausea and vomiting)   . Pneumonia     02/2011   Medications:  Prescriptions prior to admission  Medication Sig Dispense Refill  . buPROPion (WELLBUTRIN XL) 300 MG 24 hr tablet Take 300 mg by mouth daily.      . cetirizine (ZYRTEC) 10 MG tablet Take 10 mg by mouth daily as needed. For allergies.      . cholecalciferol (VITAMIN D) 1000 UNITS tablet Take 1,000 Units by mouth daily.      Marland Kitchen esomeprazole (NEXIUM) 40 MG packet Take 40 mg by mouth daily before breakfast.  24 each  0  . ondansetron (ZOFRAN) 4 MG tablet Take 4 mg by mouth every 8 (eight) hours as needed. For nausea.      . promethazine (PHENERGAN) 25 MG tablet Take 25 mg by mouth every 6 (six) hours as needed. For nausea.      . Pyridoxine HCl (VITAMIN B-6 PO) Take 1 tablet by mouth daily.      . vitamin B-12 (CYANOCOBALAMIN) 1000 MCG tablet Take 1,000 mcg by mouth daily.       Scheduled:     . buPROPion  300 mg Oral Daily  . chlorhexidine  15 mL  Mouth Rinse BID  . docusate sodium  100 mg Oral Daily  . enoxaparin (LOVENOX) injection  1 mg/kg Subcutaneous BID  . feeding supplement  237 mL Oral BID BM  . loratadine  10 mg Oral Daily  . morphine  15 mg Oral Q12H  . morphine   Intravenous Q4H  . ondansetron (ZOFRAN) IV  4 mg Intravenous TID AC & HS  . pantoprazole  40 mg Oral Q1200  . sodium chloride  10-40 mL Intracatheter Q12H  . DISCONTD: traMADol  50 mg Oral Q6H    Assessment:  46yo F admitted 6/13 with RUQ pain, underwent ERCP with sphincterotomy and insertion of pancreatic stent then developed pancreatitis with pseudocysts.  Pain and tenderness at old PICC site -> dopplers revealed DVT.  Day #5 Lovenox.  Starting Coumadin today. Will need 5 day overlap and INR therapeutic x 24hrs per guidelines.  Full liquid diet, tolerance improving.  Renal fx wnl.  No bleeding reported. H/H low but stable. Pltc high/stable.  Goal of Therapy:  Dose adjusted for weight and renal fxn. Monitor platelets by anticoagulation protocol: Yes   Plan:  Continue Lovenox 75mg  SQ q12h.  Give Coumadin 7.5mg  tonight.  F/u daily INR and CBC.  Coumadin counseling.  Charolotte Eke, PharmD, pager [phone]. 02/15/2012,3:47 PM.

## 2012-02-16 LAB — CBC
HCT: 27.3 % — ABNORMAL LOW (ref 36.0–46.0)
Hemoglobin: 9 g/dL — ABNORMAL LOW (ref 12.0–15.0)
MCH: 29 pg (ref 26.0–34.0)
MCHC: 33 g/dL (ref 30.0–36.0)
RBC: 3.1 MIL/uL — ABNORMAL LOW (ref 3.87–5.11)

## 2012-02-16 LAB — HEPATIC FUNCTION PANEL
ALT: 47 U/L — ABNORMAL HIGH (ref 0–35)
Albumin: 2.8 g/dL — ABNORMAL LOW (ref 3.5–5.2)
Alkaline Phosphatase: 99 U/L (ref 39–117)
Total Bilirubin: 0.1 mg/dL — ABNORMAL LOW (ref 0.3–1.2)
Total Protein: 6.7 g/dL (ref 6.0–8.3)

## 2012-02-16 MED ORDER — MORPHINE SULFATE ER 30 MG PO TBCR: 30.0000 mg | EXTENDED_RELEASE_TABLET | Freq: Two times a day (BID) | ORAL | Status: DC

## 2012-02-16 MED ORDER — MORPHINE SULFATE ER 15 MG PO TBCR
15.0000 mg | EXTENDED_RELEASE_TABLET | Freq: Two times a day (BID) | ORAL | Status: DC
  Administered 2012-02-16 – 2012-02-17 (×3): 15 mg via ORAL
  Filled 2012-02-16 (×3): qty 1

## 2012-02-16 MED ORDER — MORPHINE SULFATE ER 15 MG PO TBCR
15.0000 mg | EXTENDED_RELEASE_TABLET | Freq: Two times a day (BID) | ORAL | Status: DC
  Filled 2012-02-16: qty 1

## 2012-02-16 MED ORDER — WARFARIN SODIUM 7.5 MG PO TABS
7.5000 mg | ORAL_TABLET | Freq: Once | ORAL | Status: AC
  Administered 2012-02-16: 7.5 mg via ORAL
  Filled 2012-02-16: qty 1

## 2012-02-16 MED ORDER — MORPHINE SULFATE ER 15 MG PO TBCR
15.0000 mg | EXTENDED_RELEASE_TABLET | Freq: Once | ORAL | Status: AC
  Administered 2012-02-16: 15 mg via ORAL

## 2012-02-16 NOTE — Progress Notes (Signed)
Patient ID: Cassie Berry, female   DOB: Jun 10, 1966, 47 y.o.   MRN: [id number]  Gastroenterology Progress Note  Subjective: Doing well- able to leave IV out- using Zofran around the clock, and now on MS contin which is working to control pain. Started coumadin yesterday. She is eating better but still struggling with appetite and altered taste. Goal is 2 Ensures per day and Full liquids/to soft foods-eating a little cereal etc. Tmax 100.7  Objective:  Vital signs in last 24 hours: Temp:  [98.4 F (36.9 C)-100.7 F (38.2 C)] 98.4 F (36.9 C) (07/07 0502) Pulse Rate:  [83-91] 83  (07/07 0502) Resp:  [17-20] 18  (07/07 0502) BP: (102-116)/(64-77) 102/64 mmHg (07/07 0502) SpO2:  [94 %-97 %] 96 % (07/07 0502) FiO2 (%):  [96 %-97 %] 97 % (07/06 1814) Last BM Date: 02/14/12 General:   Alert,  Well-developed,    in NAD Heart:  Regular rate and rhythm; no murmurs Pulm;clear Abdomen:  Soft, tender epigastrium and nondistended. Normal bowel sounds, without guarding,   Extremities:  Without edema. Neurologic:  Alert and  oriented x4;  grossly normal neurologically. Psych:  Alert and cooperative. Normal mood and affect.  Intake/Output from previous day: 07/06 0701 - 07/07 0700 In: 840 [P.O.:840] Out: 3500 [Urine:3500] Intake/Output this shift:    Lab Results:  Basename 02/16/12 0422 02/15/12 0441 02/14/12 0400  WBC 7.2 8.3 8.0  HGB 9.0* 8.5* 8.4*  HCT 27.3* 25.6* 25.5*  PLT 552* 551* 588*   BMET No results found for this basename: NA:3,K:3,CL:3,CO2:3,GLUCOSE:3,BUN:3,CREATININE:3,CALCIUM:3 in the last 72 hours LFT  Basename 02/16/12 0422  PROT 6.7  ALBUMIN 2.8*  AST 61*  ALT 47*  ALKPHOS 99  BILITOT 0.1*  BILIDIR <0.1  IBILI NOT CALCULATED   PT/INR  Basename 02/16/12 0422  LABPROT 15.7*  INR 1.22    Assessment / Plan: 46 yo female with severe post ERCP pancreatitis with multiple organized fluid collections. Stable and making gradual improvement with po  intake. Continue MS Contin  But change to 7am/7pm Continue efforts with increased intake #2 RUE DVT- ON Lovenox, and transitioning to Coumadin #3 anemia- multifactorial -stable Would anticipate discharge Tues/WEd when INR therapeutic, will need office follow up one week post discharge Active Problems:  Acute pancreatitis  Bilateral pleural effusion  Fever  Hyponatremia  Anemia  Feeding difficulties  Nonspecific (abnormal) findings on radiological and other examination of gastrointestinal tract  Deep vein thrombosis, upper right extremity     LOS: 24 days   Amy Esterwood  02/16/2012, 9:11 AM    GI ATTENDING  Patient seen and examined. Agree with above.Patient's husband in room this afternoon. She had some pain after eating a meal. More cramping and anything else. No nausea or vomiting. No other issues. Physical exam is benign. We will continue with calorie counts. She is being placed on Coumadin for DVT of the right upper extremity. She has increased her activities. Hopefully with adequate nutrition and pain control she can move towards discharge. She will need close outpatient followup.  Wilhemina Bonito. Eda Keys., M.D. [facility] Division of Gastroenterology

## 2012-02-16 NOTE — Progress Notes (Signed)
ANTICOAGULATION CONSULT NOTE - follow up  Pharmacy Consult for Lovenox & Coumadin Indication: DVT  Allergies  Allergen Reactions  . Macrodantin Rash  . Nitrofurantoin    Patient Measurements: Height: 5\' 4"  (162.6 cm) Weight: 166 lb 0.1 oz (75.3 kg) IBW/kg (Calculated) : 54.7   Vital Signs: Temp: 98.4 F (36.9 C) (07/07 0502) Temp src: Oral (07/07 0502) BP: 102/64 mmHg (07/07 0502) Pulse Rate: 83  (07/07 0502)  Labs:  Basename 02/16/12 0422 02/15/12 0441 02/14/12 0400  HGB 9.0* 8.5* --  HCT 27.3* 25.6* 25.5*  PLT 552* 551* 588*  APTT -- -- --  LABPROT 15.7* -- --  INR 1.22 -- --  HEPARINUNFRC -- -- --  CREATININE -- -- --  CKTOTAL -- -- --  CKMB -- -- --  TROPONINI -- -- --   Estimated Creatinine Clearance: 88.2 ml/min (by C-G formula based on Cr of 0.46).  Medications:  Prescriptions prior to admission  Medication Sig Dispense Refill  . buPROPion (WELLBUTRIN XL) 300 MG 24 hr tablet Take 300 mg by mouth daily.      . cetirizine (ZYRTEC) 10 MG tablet Take 10 mg by mouth daily as needed. For allergies.      . cholecalciferol (VITAMIN D) 1000 UNITS tablet Take 1,000 Units by mouth daily.      Marland Kitchen esomeprazole (NEXIUM) 40 MG packet Take 40 mg by mouth daily before breakfast.  24 each  0  . ondansetron (ZOFRAN) 4 MG tablet Take 4 mg by mouth every 8 (eight) hours as needed. For nausea.      . promethazine (PHENERGAN) 25 MG tablet Take 25 mg by mouth every 6 (six) hours as needed. For nausea.      . Pyridoxine HCl (VITAMIN B-6 PO) Take 1 tablet by mouth daily.      . vitamin B-12 (CYANOCOBALAMIN) 1000 MCG tablet Take 1,000 mcg by mouth daily.       Scheduled:     . buPROPion  300 mg Oral Daily  . chlorhexidine  15 mL Mouth Rinse BID  . docusate sodium  100 mg Oral Daily  . enoxaparin (LOVENOX) injection  1 mg/kg Subcutaneous BID  . feeding supplement  237 mL Oral BID BM  . loratadine  10 mg Oral Daily  . morphine  15 mg Oral Q12H  . morphine   Intravenous Q4H  .  ondansetron (ZOFRAN) IV  4 mg Intravenous TID AC & HS  . pantoprazole  40 mg Oral Q1200  . sodium chloride  10-40 mL Intracatheter Q12H  . warfarin  7.5 mg Oral ONCE-1800  . Warfarin - Pharmacist Dosing Inpatient   Does not apply q1800  . DISCONTD: traMADol  50 mg Oral Q6H   Assessment:  [name] admitted 6/13 with RUQ pain, underwent ERCP with sphincterotomy and insertion of pancreatic stent then developed pancreatitis with pseudocysts.  Pain and tenderness at old PICC site -> dopplers revealed DVT.  Day #6 Lovenox.  Day #2 Lovenox/Coumadin overlap. INR not rising yet after first dose.  Full liquid diet, tolerance poor.   Renal fx wnl.  No bleeding reported. H/H low but stable. Pltc high/stable.  Goal of Therapy:  Dose adjusted for weight and renal fxn. Monitor platelets by anticoagulation protocol: Yes   Plan:   Continue Lovenox 75mg  SQ q12h.  Repeat Coumadin 7.5mg  today.  F/u daily INR and CBC.  Counseled patient on anticoag therapy. Pt is a Engineer, civil (consulting).  Charolotte Eke, PharmD, pager [phone]. 02/16/2012,9:05 AM.

## 2012-02-17 ENCOUNTER — Telehealth: Payer: Self-pay

## 2012-02-17 DIAGNOSIS — R633 Feeding difficulties: Secondary | ICD-10-CM

## 2012-02-17 DIAGNOSIS — I82629 Acute embolism and thrombosis of deep veins of unspecified upper extremity: Secondary | ICD-10-CM

## 2012-02-17 LAB — CBC
Hemoglobin: 9.2 g/dL — ABNORMAL LOW (ref 12.0–15.0)
MCHC: 33.2 g/dL (ref 30.0–36.0)
Platelets: 537 10*3/uL — ABNORMAL HIGH (ref 150–400)
RBC: 3.14 MIL/uL — ABNORMAL LOW (ref 3.87–5.11)
RDW: 13.3 % (ref 11.5–15.5)
WBC: 8.5 10*3/uL (ref 4.0–10.5)

## 2012-02-17 LAB — PROTIME-INR
INR: 1.65 — ABNORMAL HIGH (ref 0.00–1.49)
Prothrombin Time: 19.8 seconds — ABNORMAL HIGH (ref 11.6–15.2)

## 2012-02-17 MED ORDER — WARFARIN SODIUM 5 MG PO TABS
5.0000 mg | ORAL_TABLET | Freq: Once | ORAL | Status: AC
  Administered 2012-02-17: 5 mg via ORAL
  Filled 2012-02-17: qty 1

## 2012-02-17 MED ORDER — ENSURE PUDDING PO PUDG
1.0000 | ORAL | Status: DC | PRN
  Filled 2012-02-17: qty 1

## 2012-02-17 MED ORDER — PROMETHAZINE HCL 25 MG/ML IJ SOLN
6.2500 mg | Freq: Four times a day (QID) | INTRAMUSCULAR | Status: DC | PRN
  Administered 2012-02-17 – 2012-02-19 (×5): 6.25 mg via INTRAVENOUS
  Filled 2012-02-17 (×5): qty 1

## 2012-02-17 MED ORDER — SIMETHICONE 80 MG PO CHEW
80.0000 mg | CHEWABLE_TABLET | Freq: Two times a day (BID) | ORAL | Status: DC | PRN
  Administered 2012-02-17: 80 mg via ORAL
  Filled 2012-02-17: qty 1

## 2012-02-17 MED ORDER — MORPHINE SULFATE 15 MG PO TABS
7.5000 mg | ORAL_TABLET | ORAL | Status: DC | PRN
  Administered 2012-02-29: 7.5 mg via ORAL
  Filled 2012-02-17 (×2): qty 1

## 2012-02-17 MED ORDER — SODIUM CHLORIDE 0.45 % IV SOLN
INTRAVENOUS | Status: DC
  Administered 2012-02-17: 22:00:00 via INTRAVENOUS
  Administered 2012-02-18: 1000 mL via INTRAVENOUS
  Administered 2012-02-18 – 2012-02-19 (×2): via INTRAVENOUS
  Administered 2012-02-20: 1000 mL via INTRAVENOUS

## 2012-02-17 MED ORDER — MORPHINE SULFATE 2 MG/ML IJ SOLN
1.0000 mg | INTRAMUSCULAR | Status: DC | PRN
  Administered 2012-02-17: 2 mg via INTRAVENOUS
  Filled 2012-02-17: qty 1

## 2012-02-17 NOTE — Progress Notes (Signed)
[facility] Progress Note  SUBJECTIVE: Had significant epigastric pain after eating tomato soup last night. MS Contin keeps her comfortable but needs something for breakthrough pain. Zofran helps nausea. Having a BM every few days. Trying to eat, had highest caloric intake yesterday.  OBJECTIVE:  Vital signs in last 24 hours: Temp:  [98.2 F (36.8 C)-100.6 F (38.1 C)] 98.2 F (36.8 C) (07/08 0521) Pulse Rate:  [82-98] 82  (07/08 0521) Resp:  [16] 16  (07/08 0521) BP: (99-103)/(64-69) 103/68 mmHg (07/08 0521) SpO2:  [95 %-96 %] 96 % (07/08 0521) Weight:  [149 lb 0.4 oz (67.597 kg)] 149 lb 0.4 oz (67.597 kg) (07/08 0433) Last BM Date: 02/14/12 General:    white female in NAD Heart:  Regular rate and rhythm Lungs: Respirations even and unlabored, lungs CTA bilaterally Abdomen:  Soft, nontender and mild epigastric tenderness. Normal bowel sounds. Extremities:  Without edema. Neurologic:  Alert and oriented,  grossly normal neurologically. Psych:  Cooperative. Normal mood and affect.   Lab Results:  Basename 02/17/12 0430 02/16/12 0422 02/15/12 0441  WBC 8.5 7.2 8.3  HGB 9.2* 9.0* 8.5*  HCT 27.7* 27.3* 25.6*  PLT 537* 552* 551*   LFT  Basename 02/16/12 0422  PROT 6.7  ALBUMIN 2.8*  AST 61*  ALT 47*  ALKPHOS 99  BILITOT 0.1*  BILIDIR <0.1  IBILI NOT CALCULATED   PT/INR  Basename 02/17/12 0430 02/16/12 0422  LABPROT 19.8* 15.7*  INR 1.65* 1.22     ASSESSMENT / PLAN:  1. Severe post-ERCP pancreatitis, slowly improving. Tmax 100.6. Trying to eat but having spike in pain with solids. Yesterday she had approximately 1600 calories intake (formal count not in). Encouraged at least 3 Ensures a day. She is able to tolerate Yogurt, ice cream, and shakes as well. Maybe she can go home later this week. 2. DVT, on .Coumadin, Pharmacy monitoring  INR, it was 1.65 yesterday.     LOS: 25 days   [name]  02/17/2012, 8:05 AM

## 2012-02-17 NOTE — Telephone Encounter (Signed)
Lurena Joiner RN from [facility] called and states that the pt is c/o being very bloated and can't eat much at all. Pt is taking her zofran but nurse wanted to know if she could have something for the bloating. Please advise. Pt in room 1311

## 2012-02-17 NOTE — Progress Notes (Signed)
I have taken an interval history, reviewed the chart and examined the patient. I agree with the extender's note, impression and recommendations.   Zuriel Roskos T. Mililani Murthy MD FACG 

## 2012-02-17 NOTE — Telephone Encounter (Signed)
Spoke with [name] NP and she will address.

## 2012-02-17 NOTE — Progress Notes (Signed)
Page to Dr. Russella Dar. Notified of patient emesis and continued complaints of nausea. New orders

## 2012-02-17 NOTE — Telephone Encounter (Signed)
Patient is in the hospital. Please contact either Amy or Gunnar Fusi, whoever is rounding in the hospital to address this problem

## 2012-02-17 NOTE — Progress Notes (Signed)
Pt called RN to room. Repeated bouts of nausea and vomited approx. 200cc's of light tan emesis. Reported she is still feeling nausea and will be unable to tolerate taking anything PO, for it at this time. Discussed possible resolution and requested IV be replaced for IV med for nausea

## 2012-02-17 NOTE — Progress Notes (Signed)
ANTICOAGULATION CONSULT NOTE - follow up  Pharmacy Consult for Lovenox & Coumadin Indication: DVT  Allergies  Allergen Reactions  . Macrodantin Rash  . Nitrofurantoin    Patient Measurements: Height: 5\' 4"  (162.6 cm) Weight: 149 lb 0.4 oz (67.597 kg) IBW/kg (Calculated) : 54.7   Vital Signs: Temp: 98.2 F (36.8 C) (07/08 0521) Temp src: Oral (07/08 0521) BP: 103/68 mmHg (07/08 0521) Pulse Rate: 82  (07/08 0521)  Labs:  Basename 02/17/12 0430 02/16/12 0422 02/15/12 0441  HGB 9.2* 9.0* --  HCT 27.7* 27.3* 25.6*  PLT 537* 552* 551*  APTT -- -- --  LABPROT 19.8* 15.7* --  INR 1.65* 1.22 --  HEPARINUNFRC -- -- --  CREATININE -- -- --  CKTOTAL -- -- --  CKMB -- -- --  TROPONINI -- -- --   Estimated Creatinine Clearance: 84 ml/min (by C-G formula based on Cr of 0.46).  Medications:  Prescriptions prior to admission  Medication Sig Dispense Refill  . buPROPion (WELLBUTRIN XL) 300 MG 24 hr tablet Take 300 mg by mouth daily.      . cetirizine (ZYRTEC) 10 MG tablet Take 10 mg by mouth daily as needed. For allergies.      . cholecalciferol (VITAMIN D) 1000 UNITS tablet Take 1,000 Units by mouth daily.      Marland Kitchen esomeprazole (NEXIUM) 40 MG packet Take 40 mg by mouth daily before breakfast.  24 each  0  . ondansetron (ZOFRAN) 4 MG tablet Take 4 mg by mouth every 8 (eight) hours as needed. For nausea.      . promethazine (PHENERGAN) 25 MG tablet Take 25 mg by mouth every 6 (six) hours as needed. For nausea.      . Pyridoxine HCl (VITAMIN B-6 PO) Take 1 tablet by mouth daily.      . vitamin B-12 (CYANOCOBALAMIN) 1000 MCG tablet Take 1,000 mcg by mouth daily.       Scheduled:     . buPROPion  300 mg Oral Daily  . chlorhexidine  15 mL Mouth Rinse BID  . docusate sodium  100 mg Oral Daily  . enoxaparin (LOVENOX) injection  1 mg/kg Subcutaneous BID  . feeding supplement  237 mL Oral BID BM  . loratadine  10 mg Oral Daily  . morphine  15 mg Oral Q12H  . morphine   Intravenous Q4H   . ondansetron (ZOFRAN) IV  4 mg Intravenous TID AC & HS  . pantoprazole  40 mg Oral Q1200  . sodium chloride  10-40 mL Intracatheter Q12H  . warfarin  7.5 mg Oral ONCE-1800  . Warfarin - Pharmacist Dosing Inpatient   Does not apply q1800   Assessment:  [name] admitted 6/13 with RUQ pain, underwent ERCP with sphincterotomy and insertion of pancreatic stent then developed pancreatitis with pseudocysts.  Pain and tenderness at old PICC site -> dopplers revealed DVT.  Day #6 Lovenox.  Day #3 of minimum 5 day overlap Lovenox/Coumadin overlap.  INR rising somewhat quickly after 2 x 7.5mg  7/6-7/7  Full liquid diet, intake low but improving.   Renal fx wnl.  No bleeding reported. H/H low but stable. Pltc high/stable.  Goal of Therapy:  Dose adjusted for weight and renal fxn. Monitor platelets by anticoagulation protocol: Yes   Plan:   Continue Lovenox 75mg  SQ q12h.  Coumadin 5mg  today.  F/u daily INR and CBC.  Education completed 7/7  Gwen Her PharmD  [phone] 02/17/2012 9:52 AM

## 2012-02-17 NOTE — Progress Notes (Signed)
UR done. 

## 2012-02-17 NOTE — Progress Notes (Signed)
Page to IV team

## 2012-02-17 NOTE — Progress Notes (Signed)
Calorie Count Note  Diet: Full liquid Supplements: Ensure Complete BID  7/6 Breakfast: 599 calories, 17g protein Lunch: 360 calories, 13g protein (including 1 Ensure Complete) Dinner: 870 calories, 25g protein (including 1 Ensure Complete) Total: 1829 calories, 55g protein   7/7 Breakfast: 314 calories,18g protein (including 1 Ensure Complete) Lunch: 250 calories, 14g protein (including 1/2 of citrus smoothie) Dinner: 300 calories, 12g protein Supplements: 350 calories, 13g protein (1 Ensure Complete) Total: 1214 calories, 57g protein  Average daily intake: 1521 kcal (80% of minimum estimated needs)  56g protein (51% of minimum estimated needs)  - Pt still c/o pain 15-20 minutes after eating solid foods, states liquids do not bother her. Pt states her nausea has been well-controlled with Zofran. Pt states she has been "pushing food", however c/o bloating after eating tomato soup last night. Pt states that mashed potatoes hurt her stomach and that she tried to eat rice krispies but they taste liked styrofoam despite adding milk and sugar. Discussed calorie count progress with pt, she is pleased with her improved nutrition, however still c/o that she has no appetite.   Nutrition Dx: Inadequate oral intake - improved  Goal: Pt to consume >90% of meals/supplements - met with supplements, pt has been consuming 100% of 2 Ensure Completes daily, however not met with meals, pt's averaging 50% meal intake.   Intervention: Encouraged gradual increased intake, which pt is working on. Magic Cup ice cream supplement PRN for additional nutrition, pt knows to ask for pt when she feels ready to eat it and RN knows it is available for pt in floor freezer. Ensure pudding BID. Educated pt on high calorie/protein foods including meal/snack/beverage choices and provided handout of this information. Will continue to monitor and analyze calorie count. Recommend MD increase diet to bland diet to give pt more  meal option choices. Recommend MD order Biotene rinse to help with pt's taste changes. Recommend MD consider appetite stimulant if appropriate.   Dietitian# [phone]

## 2012-02-18 DIAGNOSIS — R112 Nausea with vomiting, unspecified: Secondary | ICD-10-CM

## 2012-02-18 LAB — CBC
HCT: 27.6 % — ABNORMAL LOW (ref 36.0–46.0)
Hemoglobin: 9.2 g/dL — ABNORMAL LOW (ref 12.0–15.0)
MCHC: 33.3 g/dL (ref 30.0–36.0)
RDW: 13.1 % (ref 11.5–15.5)
WBC: 8.7 10*3/uL (ref 4.0–10.5)

## 2012-02-18 LAB — PROTIME-INR
INR: 2.38 — ABNORMAL HIGH (ref 0.00–1.49)
Prothrombin Time: 26.4 seconds — ABNORMAL HIGH (ref 11.6–15.2)

## 2012-02-18 MED ORDER — WARFARIN SODIUM 5 MG PO TABS
5.0000 mg | ORAL_TABLET | Freq: Once | ORAL | Status: AC
  Administered 2012-02-18: 5 mg via ORAL
  Filled 2012-02-18: qty 1

## 2012-02-18 MED ORDER — MORPHINE SULFATE 2 MG/ML IJ SOLN
1.0000 mg | INTRAMUSCULAR | Status: DC | PRN
  Administered 2012-02-18: 1 mg via INTRAVENOUS
  Administered 2012-02-18 – 2012-02-29 (×40): 2 mg via INTRAVENOUS
  Filled 2012-02-18 (×41): qty 1

## 2012-02-18 NOTE — Progress Notes (Signed)
I have taken an interval history, reviewed the chart and examined the patient. I agree with the extender's note, impression and recommendations.   Malcolm T. Stark MD FACG 

## 2012-02-18 NOTE — Progress Notes (Signed)
Prior order for Morphine PCA update order addressed in error. Update unable to be removed, however no changes were made to original order.

## 2012-02-18 NOTE — Progress Notes (Signed)
[facility] Progress Note  SUBJECTIVE: vomiting significant amount of undigested food /Ensure last night, she hasn't eaten since.   OBJECTIVE:  Vital signs in last 24 hours: Temp:  [98.1 F (36.7 C)-98.5 F (36.9 C)] 98.5 F (36.9 C) (07/09 0651) Pulse Rate:  [87-96] 87  (07/09 0651) Resp:  [18-20] 18  (07/09 0651) BP: (97-106)/(61-71) 97/61 mmHg (07/09 0651) SpO2:  [95 %-97 %] 95 % (07/09 0651) Last BM Date: 02/14/12 General:    Pleasant white female in NAD sitting on side of bed bathing herself. Heart:  Regular rate and rhythm Lungs: Respirations even and unlabored, lungs CTA bilaterally Abdomen:  Soft, nondistended, mild to moderate epigastric and RLQ tenderness. Hypoactive bowel sounds.  Extremities:  Without edema. Neurologic:  Alert and oriented,  grossly normal neurologically. Psych:  Cooperative. Normal mood and affect.     Lab Results:  Basename 02/18/12 0335 02/17/12 0430 02/16/12 0422  WBC 8.7 8.5 7.2  HGB 9.2* 9.2* 9.0*  HCT 27.6* 27.7* 27.3*  PLT 530* 537* 552*   BMET No results found for this basename: NA:3,K:3,CL:3,CO2:3,GLUCOSE:3,BUN:3,CREATININE:3,CALCIUM:3 in the last 72 hours LFT  Basename 02/16/12 0422  PROT 6.7  ALBUMIN 2.8*  AST 61*  ALT 47*  ALKPHOS 99  BILITOT 0.1*  BILIDIR <0.1  IBILI NOT CALCULATED   PT/INR  Basename 02/18/12 0335 02/17/12 0430  LABPROT 26.4* 19.8*  INR 2.38* 1.65*     ASSESSMENT / PLAN:  1. Post-ERCP pancreatitis, recovery has been very slow. She had a setback last night with nausea and vomiting. No further nausea today. No abdominal pain at present (without pain meds). Tmax 99.1. WBC normal. She may need repeat imaging to evaluate for partial obstruction, secondary to pseudocysts.  2. Malnutrition. She is not meeting minimal caloric requirements based on calorie count. Will see if she has partial obstruction causing the nausea 3.  DVT, on coumadin.  INR up to 2.38. Management per pharmacy.     LOS: 26 days   [name]  02/18/2012, 9:17 AM

## 2012-02-18 NOTE — Progress Notes (Signed)
Calorie Count Note  - Noted events of last night - pt vomiting of light tan emesis. The only food/drink pt has had all day has been ginger ale, which pt is sipping on. Pt denies any specific food/drink/supplement causing vomiting last night, just states her stomach "hit it's limit". Pt continues to feel bloated and full. Pt also c/o feeling like food is stuck in the base of her throat, despite not eating all day. Pt states when she was vomiting last night, the Zofran didn't help her feel better, only the Phenergan. Pt states her last BM was Friday and is currently getting Colace. Hopefully once pt has a bowel movement she will feel less full and eat better. Pt currently denies any nausea after recently getting Phenergan.   No substational calorie/protein intake to record for today.  Will continue to monitor pt's intake. Awaiting MD decision to continue or d/c calorie count.   Dietitian# [phone]

## 2012-02-18 NOTE — Progress Notes (Signed)
ANTICOAGULATION CONSULT NOTE - follow up  Pharmacy Consult for Lovenox & Coumadin Indication: DVT  Allergies  Allergen Reactions  . Macrodantin Rash  . Nitrofurantoin    Patient Measurements: Height: 5\' 4"  (162.6 cm) Weight: 149 lb 0.4 oz (67.597 kg) IBW/kg (Calculated) : 54.7   Vital Signs: Temp: 99.1 F (37.3 C) (07/09 0800) Temp src: Oral (07/09 0800) BP: 105/70 mmHg (07/09 0800) Pulse Rate: 89  (07/09 0800)  Labs:  Basename 02/18/12 0335 02/17/12 0430 02/16/12 0422  HGB 9.2* 9.2* --  HCT 27.6* 27.7* 27.3*  PLT 530* 537* 552*  APTT -- -- --  LABPROT 26.4* 19.8* 15.7*  INR 2.38* 1.65* 1.22  HEPARINUNFRC -- -- --  CREATININE -- -- --  CKTOTAL -- -- --  CKMB -- -- --  TROPONINI -- -- --   Estimated Creatinine Clearance: 84 ml/min (by C-G formula based on Cr of 0.46).  Medications:  Prescriptions prior to admission  Medication Sig Dispense Refill  . buPROPion (WELLBUTRIN XL) 300 MG 24 hr tablet Take 300 mg by mouth daily.      . cetirizine (ZYRTEC) 10 MG tablet Take 10 mg by mouth daily as needed. For allergies.      . cholecalciferol (VITAMIN D) 1000 UNITS tablet Take 1,000 Units by mouth daily.      Marland Kitchen esomeprazole (NEXIUM) 40 MG packet Take 40 mg by mouth daily before breakfast.  24 each  0  . ondansetron (ZOFRAN) 4 MG tablet Take 4 mg by mouth every 8 (eight) hours as needed. For nausea.      . promethazine (PHENERGAN) 25 MG tablet Take 25 mg by mouth every 6 (six) hours as needed. For nausea.      . Pyridoxine HCl (VITAMIN B-6 PO) Take 1 tablet by mouth daily.      . vitamin B-12 (CYANOCOBALAMIN) 1000 MCG tablet Take 1,000 mcg by mouth daily.       Scheduled:     . buPROPion  300 mg Oral Daily  . chlorhexidine  15 mL Mouth Rinse BID  . docusate sodium  100 mg Oral Daily  . enoxaparin (LOVENOX) injection  1 mg/kg Subcutaneous BID  . feeding supplement  237 mL Oral BID BM  . loratadine  10 mg Oral Daily  . morphine  15 mg Oral Q12H  . morphine    Intravenous Q4H  . ondansetron (ZOFRAN) IV  4 mg Intravenous TID AC & HS  . pantoprazole  40 mg Oral Q1200  . sodium chloride  10-40 mL Intracatheter Q12H  . warfarin  5 mg Oral ONCE-1800  . Warfarin - Pharmacist Dosing Inpatient   Does not apply q1800   Assessment:  46yo F admitted 6/13 with RUQ pain, underwent ERCP with sphincterotomy and insertion of pancreatic stent then developed pancreatitis with pseudocysts.  Pain and tenderness at old PICC site -> dopplers revealed DVT.  Day #7 Lovenox.  Day #4 of minimum 5 day overlap Lovenox/Coumadin overlap.  INR rising somewhat quickly after 2 x 7.5mg  7/6-7/7, 1 x 5 mg 7/8.  INR therapeutic today 2.38.  Full liquid diet, intake low but improving.   Renal fx wnl.  No bleeding reported. H/H low but stable. Pltc high/stable.  Goal of Therapy:  INR 2-3 Dose adjusted for weight and renal fxn. Monitor platelets by anticoagulation protocol: Yes   Plan:   Continue Lovenox 75mg  SQ q12h for minimum of 5 day overlap. May be able to d/c tomorrow if INR remains therapeutic  Coumadin 5mg  today.  F/u daily INR and CBC.  Education completed 7/7.  Tammy Sours, PharmD [phone] 02/18/2012 10:44 AM

## 2012-02-19 ENCOUNTER — Inpatient Hospital Stay (HOSPITAL_COMMUNITY): Payer: 59

## 2012-02-19 DIAGNOSIS — R933 Abnormal findings on diagnostic imaging of other parts of digestive tract: Secondary | ICD-10-CM

## 2012-02-19 LAB — CBC
HCT: 27.2 % — ABNORMAL LOW (ref 36.0–46.0)
Hemoglobin: 9.1 g/dL — ABNORMAL LOW (ref 12.0–15.0)
MCHC: 33.5 g/dL (ref 30.0–36.0)
RDW: 13 % (ref 11.5–15.5)
WBC: 8.1 10*3/uL (ref 4.0–10.5)

## 2012-02-19 LAB — PROTIME-INR
INR: 2.49 — ABNORMAL HIGH (ref 0.00–1.49)
Prothrombin Time: 27.3 seconds — ABNORMAL HIGH (ref 11.6–15.2)

## 2012-02-19 MED ORDER — NYSTATIN 100000 UNIT/ML MT SUSP
5.0000 mL | Freq: Four times a day (QID) | OROMUCOSAL | Status: DC
  Filled 2012-02-19 (×4): qty 5

## 2012-02-19 MED ORDER — WARFARIN SODIUM 5 MG PO TABS
5.0000 mg | ORAL_TABLET | Freq: Once | ORAL | Status: AC
  Administered 2012-02-19: 5 mg via ORAL
  Filled 2012-02-19: qty 1

## 2012-02-19 MED ORDER — NYSTATIN 100000 UNIT/ML MT SUSP
5.0000 mL | Freq: Four times a day (QID) | OROMUCOSAL | Status: DC
  Administered 2012-02-19 – 2012-02-24 (×15): 500000 [IU] via ORAL
  Filled 2012-02-19 (×2): qty 60

## 2012-02-19 MED ORDER — PROMETHAZINE HCL 25 MG/ML IJ SOLN
12.5000 mg | Freq: Four times a day (QID) | INTRAMUSCULAR | Status: DC | PRN
  Administered 2012-02-19 – 2012-02-24 (×9): 12.5 mg via INTRAVENOUS
  Filled 2012-02-19 (×11): qty 1

## 2012-02-19 MED ORDER — IOHEXOL 300 MG/ML  SOLN
80.0000 mL | Freq: Once | INTRAMUSCULAR | Status: AC | PRN
  Administered 2012-02-19: 80 mL via INTRAVENOUS

## 2012-02-19 NOTE — Progress Notes (Signed)
[facility] Progress Note  SUBJECTIVE: vomited at 4am after trying to take PO.   OBJECTIVE:  Vital signs in last 24 hours: Temp:  [99 F (37.2 C)-99.2 F (37.3 C)] 99 F (37.2 C) (07/10 0530) Pulse Rate:  [83-98] 95  (07/10 0530) Resp:  [17-18] 18  (07/10 0530) BP: (100-109)/(63-74) 109/70 mmHg (07/10 0530) SpO2:  [94 %-97 %] 97 % (07/10 0530) Last BM Date: 02/14/12 General:    white female in NAD Heart:  Regular rate and rhythm; no murmurs Lungs: Respirations even and unlabored, lungs CTA bilaterally Abdomen:  Soft, nondistended, mild epigastric and RLQ pain. Normal bowel sounds. Extremities:  Without edema. Neurologic:  Alert and oriented,  grossly normal neurologically. Psych:  Cooperative. Appears mildly depressed.                                                                                                                                                                                                                                        Lab Results:  Basename 02/19/12 0401 02/18/12 0335 02/17/12 0430  WBC 8.1 8.7 8.5  HGB 9.1* 9.2* 9.2*  HCT 27.2* 27.6* 27.7*  PLT 517* 530* 537*   PT/INR  Basename 02/19/12 0401 02/18/12 0335  LABPROT 27.3* 26.4*  INR 2.49* 2.38*     ASSESSMENT / PLAN:  1. Post-ERCP pancreatitis, recovery has been very slow. Tmax 99.1. She wants to eat but continues to have nausea and vomiting with PO intake. Her abdominal pain is mainly related to the vomiting. Will likely get repeat imaging studies today to evaluate for partial obstruction secondary to pseudocysts. For now, she request Phenergan be increased from 6.25 to 12.5mg .   2. Malnutrition. She is not meeting minimal caloric requirements.  3. DVT, on coumadin. INR up to 2.49 Management per pharmacy.   4. Throat discomfort, feels like throat is full, like something stuck in throat. No obvious candida on exam. Will try Nystatin swish and swallow as she did have antibiotics  earlier in hospital stay. Continue daily PPI.                                                             LOS: 27 days  [name]  02/19/2012, 9:59 AM

## 2012-02-19 NOTE — Progress Notes (Signed)
ANTICOAGULATION CONSULT NOTE - follow up  Pharmacy Consult for Lovenox & Coumadin Indication: DVT  Allergies  Allergen Reactions  . Macrodantin Rash  . Nitrofurantoin    Patient Measurements: Height: 5\' 4"  (162.6 cm) Weight: 149 lb 0.4 oz (67.597 kg) IBW/kg (Calculated) : 54.7   Vital Signs: Temp: 99 F (37.2 C) (07/10 0530) Temp src: Oral (07/10 0530) BP: 109/70 mmHg (07/10 0530) Pulse Rate: 95  (07/10 0530)  Labs:  Basename 02/19/12 0401 02/18/12 0335 02/17/12 0430  HGB 9.1* 9.2* --  HCT 27.2* 27.6* 27.7*  PLT 517* 530* 537*  APTT -- -- --  LABPROT 27.3* 26.4* 19.8*  INR 2.49* 2.38* 1.65*  HEPARINUNFRC -- -- --  CREATININE -- -- --  CKTOTAL -- -- --  CKMB -- -- --  TROPONINI -- -- --   Estimated Creatinine Clearance: 84 ml/min (by C-G formula based on Cr of 0.46).  Medications:  Prescriptions prior to admission  Medication Sig Dispense Refill  . buPROPion (WELLBUTRIN XL) 300 MG 24 hr tablet Take 300 mg by mouth daily.      . cetirizine (ZYRTEC) 10 MG tablet Take 10 mg by mouth daily as needed. For allergies.      . cholecalciferol (VITAMIN D) 1000 UNITS tablet Take 1,000 Units by mouth daily.      Marland Kitchen esomeprazole (NEXIUM) 40 MG packet Take 40 mg by mouth daily before breakfast.  24 each  0  . ondansetron (ZOFRAN) 4 MG tablet Take 4 mg by mouth every 8 (eight) hours as needed. For nausea.      . promethazine (PHENERGAN) 25 MG tablet Take 25 mg by mouth every 6 (six) hours as needed. For nausea.      . Pyridoxine HCl (VITAMIN B-6 PO) Take 1 tablet by mouth daily.      . vitamin B-12 (CYANOCOBALAMIN) 1000 MCG tablet Take 1,000 mcg by mouth daily.       Scheduled:     . buPROPion  300 mg Oral Daily  . chlorhexidine  15 mL Mouth Rinse BID  . docusate sodium  100 mg Oral Daily  . enoxaparin (LOVENOX) injection  1 mg/kg Subcutaneous BID  . feeding supplement  237 mL Oral BID BM  . loratadine  10 mg Oral Daily  . morphine  15 mg Oral Q12H  . nystatin  5 mL Oral  QID  . ondansetron (ZOFRAN) IV  4 mg Intravenous TID AC & HS  . pantoprazole  40 mg Oral Q1200  . sodium chloride  10-40 mL Intracatheter Q12H  . warfarin  5 mg Oral ONCE-1800  . Warfarin - Pharmacist Dosing Inpatient   Does not apply q1800  . DISCONTD: morphine   Intravenous Q4H   Assessment:  46yo F admitted 6/13 with RUQ pain, underwent ERCP with sphincterotomy and insertion of pancreatic stent then developed pancreatitis with pseudocysts.  Pain and tenderness at old PICC site -> dopplers revealed DVT.  Day #8 Lovenox.  Day #5 of minimum 5 day overlap Lovenox/Coumadin overlap.  INR therapeutic today 2.49 (now therapeutic x 2 days) after 2 x 7.5mg  7/6-7/7, 2 x 5 mg 7/8, 2/9.  Full liquid diet.  Renal fx wnl.  No bleeding reported. H/H low but stable. Pltc high/stable.  Goal of Therapy:  INR 2-3 Dose adjusted for weight and renal fxn. Monitor platelets by anticoagulation protocol: Yes   Plan:   Will d/c Lovenox after last dose today.   Coumadin 5mg  today.  F/u daily INR and CBC and will  adjust coumadin dose accordingly.   Education completed 7/7.  Tammy Sours, PharmD [phone] 02/19/2012 10:36 AM

## 2012-02-19 NOTE — Progress Notes (Signed)
Patient evaluated for long-term disease management services with [facility] Care Management Program as a benefit of her [facility] insurance. Patient will receive a post discharge transition of care call upon discharge and assessment of any further needs. Spoke with Mrs Pendergraph at bedside on 02-18-12 to discuss [facility] Care Management services and [employer] [facility] to Wellness program offered to [facility] employees. Discussed in detail how [facility] Care Management offers ensure plus at a lower cost than retail stores. Offered to bring her a case prior to discharge and gave contact information as well as address to [facility] Care Management office where she can pick up her ensure as an outpatient. Will cont to follow.  Raiford Noble, RN, MSN-Ed, BSN [facility] Liaison [phone]

## 2012-02-19 NOTE — Progress Notes (Signed)
I have taken an interval history, reviewed the chart and examined the patient. I agree with the extender's note, impression and recommendations. Proceed with abd/pelvic CT today. If no clear reason for N/V noted will plan for EGD.  Venita Lick. Russella Dar MD Clementeen Graham

## 2012-02-20 ENCOUNTER — Encounter (HOSPITAL_COMMUNITY)
Admission: AD | Disposition: A | Discharge status: Home Health | Payer: Self-pay | Source: Ambulatory Visit | Attending: Gastroenterology

## 2012-02-20 ENCOUNTER — Encounter (HOSPITAL_COMMUNITY): Payer: Self-pay

## 2012-02-20 HISTORY — PX: ESOPHAGOGASTRODUODENOSCOPY: SHX5428

## 2012-02-20 LAB — CBC
HCT: 27.7 % — ABNORMAL LOW (ref 36.0–46.0)
MCHC: 32.9 g/dL (ref 30.0–36.0)
RDW: 12.7 % (ref 11.5–15.5)
WBC: 9.1 10*3/uL (ref 4.0–10.5)

## 2012-02-20 LAB — PROTIME-INR
INR: 2.85 — ABNORMAL HIGH (ref 0.00–1.49)
Prothrombin Time: 30.4 seconds — ABNORMAL HIGH (ref 11.6–15.2)

## 2012-02-20 SURGERY — EGD (ESOPHAGOGASTRODUODENOSCOPY)
Anesthesia: Moderate Sedation

## 2012-02-20 MED ORDER — MIDAZOLAM HCL 10 MG/2ML IJ SOLN
INTRAMUSCULAR | Status: AC
  Filled 2012-02-20: qty 4

## 2012-02-20 MED ORDER — MIDAZOLAM HCL 10 MG/2ML IJ SOLN
INTRAMUSCULAR | Status: DC | PRN
  Administered 2012-02-20 (×4): 2.5 mg via INTRAVENOUS

## 2012-02-20 MED ORDER — KCL IN DEXTROSE-NACL 10-5-0.45 MEQ/L-%-% IV SOLN
INTRAVENOUS | Status: DC
  Administered 2012-02-20 – 2012-02-29 (×14): via INTRAVENOUS
  Filled 2012-02-20 (×27): qty 1000

## 2012-02-20 MED ORDER — PROMETHAZINE HCL 25 MG/ML IJ SOLN
INTRAMUSCULAR | Status: DC | PRN
  Administered 2012-02-20: 6.5 mg via INTRAVENOUS

## 2012-02-20 MED ORDER — DIPHENHYDRAMINE HCL 50 MG/ML IJ SOLN
INTRAMUSCULAR | Status: AC
  Filled 2012-02-20: qty 1

## 2012-02-20 MED ORDER — FENTANYL CITRATE 0.05 MG/ML IJ SOLN
INTRAMUSCULAR | Status: AC
  Filled 2012-02-20: qty 4

## 2012-02-20 MED ORDER — FENTANYL CITRATE 0.05 MG/ML IJ SOLN
INTRAMUSCULAR | Status: DC | PRN
  Administered 2012-02-20 (×4): 25 ug via INTRAVENOUS

## 2012-02-20 MED ORDER — PROMETHAZINE HCL 25 MG/ML IJ SOLN
INTRAMUSCULAR | Status: AC
  Filled 2012-02-20: qty 1

## 2012-02-20 MED ORDER — VITAL AF 1.2 CAL PO LIQD
1000.0000 mL | ORAL | Status: DC
  Administered 2012-02-20 – 2012-02-21 (×2): 1000 mL
  Filled 2012-02-20 (×9): qty 1000

## 2012-02-20 MED ORDER — BUTAMBEN-TETRACAINE-BENZOCAINE 2-2-14 % EX AERO
INHALATION_SPRAY | CUTANEOUS | Status: DC | PRN
  Administered 2012-02-20: 2 via TOPICAL

## 2012-02-20 MED ORDER — PANTOPRAZOLE SODIUM 40 MG PO TBEC
40.0000 mg | DELAYED_RELEASE_TABLET | Freq: Two times a day (BID) | ORAL | Status: DC
  Administered 2012-02-20 – 2012-02-21 (×3): 40 mg via ORAL
  Filled 2012-02-20 (×5): qty 1

## 2012-02-20 NOTE — Op Note (Signed)
[facility] [address] [place], [place]  [address]  ENDOSCOPY PROCEDURE REPORT  PATIENT:  Cassie Berry, Cassie Berry  MR#:  [id number] BIRTHDATE:  Jan 04, 1966, 45 yrs. old  GENDER:  female ENDOSCOPIST:  Judie Petit T. Russella Dar, MD, [facility]  PROCEDURE DATE:  02/20/2012 PROCEDURE:  EGD with feeding tube placement ASA CLASS:  Class III INDICATIONS:  nausea and vomiting, feeding difficulties MEDICATIONS:  These medications were titrated to patient response per physician's verbal order, promethazine (Phenergan) 6.25 mg IV, Fentanyl 100 mg IV 10, Versed 10 mg IV TOPICAL ANESTHETIC:  Cetacaine Spray DESCRIPTION OF PROCEDURE:   After the risks benefits and alternatives of the procedure were thoroughly explained, informed consent was obtained.  The Pentax Gastroscope [id number] endoscope was introduced through the mouth and advanced to the third portion of the duodenum, without limitations.  The instrument was slowly withdrawn as the mucosa was fully examined. <<PROCEDUREIMAGES>> Duodenitis was found in the bulb and descending duodenum. It was erythematous, erosive and edematous.  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 It was edematous and erythematous. Enteral tube placement in the third portion of the duodenum. The stomach was entered and closely examined. The pylorus, antrum, angularis, and lesser curvature were well visualized, including a retroflexed view of the cardia and fundus. The stomach wall was normally distensable. The scope passed easily through the pylorus into the duodenum. The esophagus and gastroesophageal junction were completely normal in appearance.  Retroflexed views revealed no abnormalities.  The scope was then withdrawn from the patient and the procedure completed.  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 Russella Dar, MD, [facility]  n. eSIGNED:   Venita Lick. Sathvik Tiedt at 02/20/2012 02:54 PM  Venita Lick, [phone]

## 2012-02-20 NOTE — Progress Notes (Signed)
Nutrition Follow-up  Intervention: Vital 1.2 via feeding tube start at 5ml/hr increase by 10ml q6hr to goal of 29ml/hr, which will provide 1872 calories, 117g protein, and free water. If IVF d/c, recommend free water flushes q4hr. Will monitor for TF tolerance and advancement.   Assessment:  Pt unable to eat for the past few days r/t pain with eating and nausea/vomiting. Pt had c/o fullness in throat and esophagus. Pt had EGD today which showed duodenitis with stenosis in the descending duodenum. Enteral tube placed in third portion of duodenum. MD wants tube feeding to start today.   Diet Order: NPO  Meds: Scheduled Meds:   . buPROPion  300 mg Oral Daily  . chlorhexidine  15 mL Mouth Rinse BID  . docusate sodium  100 mg Oral Daily  . enoxaparin (LOVENOX) injection  1 mg/kg Subcutaneous BID  . feeding supplement  237 mL Oral BID BM  . loratadine  10 mg Oral Daily  . morphine  15 mg Oral Q12H  . nystatin  5 mL Oral QID  . ondansetron (ZOFRAN) IV  4 mg Intravenous TID AC & HS  . pantoprazole  40 mg Oral BID AC  . sodium chloride  10-40 mL Intracatheter Q12H  . warfarin  5 mg Oral ONCE-1800  . Warfarin - Pharmacist Dosing Inpatient   Does not apply q1800  . DISCONTD: pantoprazole  40 mg Oral Q1200   Continuous Infusions:   . dextrose 5 % and 0.45 % NaCl with KCl 10 mEq/L 100 mL/hr at 02/20/12 0959  . DISCONTD: sodium chloride 1,000 mL (02/20/12 0055)  . DISCONTD: dextrose 5 % and 0.9 % NaCl with KCl 40 mEq/L 75 mL/hr at 02/15/12 0700   PRN Meds:.acetaminophen, diphenhydrAMINE, feeding supplement, hyoscyamine, iohexol, morphine, morphine injection, ondansetron, promethazine, simethicone, DISCONTD: butamben-tetracaine-benzocaine, DISCONTD: fentaNYL, DISCONTD: midazolam, DISCONTD: promethazine  Labs:  CMP     Component Value Date/Time   NA 133* 02/08/2012 0633   K 4.0 02/08/2012 0633   CL 99 02/08/2012 0633   CO2 25 02/08/2012 0633   GLUCOSE 106* 02/08/2012 0633   BUN 5*  02/08/2012 0633   CREATININE 0.46* 02/08/2012 0633   CALCIUM 8.8 02/08/2012 0633   PROT 6.7 02/16/2012 0422   ALBUMIN 2.8* 02/16/2012 0422   AST 61* 02/16/2012 0422   ALT 47* 02/16/2012 0422   ALKPHOS 99 02/16/2012 0422   BILITOT 0.1* 02/16/2012 0422   GFRNONAA >90 02/08/2012 0633   GFRAA >90 02/08/2012 1610     Intake/Output Summary (Last 24 hours) at 02/20/12 1526 Last data filed at 02/20/12 0900  Gross per 24 hour  Intake 2004.83 ml  Output   1350 ml  Net 654.83 ml   Last BM - 02/14/12  Weight Status:   02/01/12 166 lb 0.1 oz 02/17/12  149 lb 0.4 oz  Re-estimated needs:   1800-2100 calories 100-115g protein  Nutrition Dx: Inadequate oral intake - ongoing  Goal: Pt to consume >90% of meals/supplements - not met, pt NPO  New goal:  1. TF to meet >/= 90% of estimated nutritional needs 2. TF tolerance   Monitor: Weights, labs, intake, TF tolerance, BM, nausea, vomiting  Marshall Cork Pager: [phone]

## 2012-02-20 NOTE — Progress Notes (Signed)
Patient ID: Cassie Berry, female   DOB: Dec 23, 1965, 46 y.o.   MRN: [id number] [facility] Progress Note  Subjective: Feels weak, cannot eat because it hurts-and she knows she will vomit.  Also c/o fullnees in her thraot esophagus  Objective:  Vital signs in last 24 hours: Temp:  [98.9 F (37.2 C)-99.2 F (37.3 C)] 99.2 F (37.3 C) (07/11 0640) Pulse Rate:  [81-94] 86  (07/11 0640) Resp:  [18] 18  (07/11 0640) BP: (100-111)/(61-68) 100/61 mmHg (07/11 0640) SpO2:  [96 %-98 %] 97 % (07/11 0640) Last BM Date: 02/14/12 General:   Alert,  Well-developed,    in NAD Heart:  Regular rate and rhythm; no murmurs Pulm;clear Abdomen:  Soft, tender and full feeling in epigastrium and RUQ and nondistended. Normal bowel sounds, without guarding, and without rebound.   Extremities:  Without edema. Neurologic:  Alert and  oriented x4;  grossly normal neurologically. Psych:  Alert and cooperative. Normal mood and affect.  Intake/Output from previous day: 07/10 0701 - 07/11 0700 In: 2779.8 [P.O.:220; I.V.:2559.8] Out: 2050 [Urine:2050] Intake/Output this shift:    Lab Results:  Basename 02/20/12 0338 02/19/12 0401 02/18/12 0335  WBC 9.1 8.1 8.7  HGB 9.1* 9.1* 9.2*  HCT 27.7* 27.2* 27.6*  PLT 511* 517* 530*   BMET No results found for this basename: NA:3,K:3,CL:3,CO2:3,GLUCOSE:3,BUN:3,CREATININE:3,CALCIUM:3 in the last 72 hours LFT No results found for this basename: PROT,ALBUMIN,AST,ALT,ALKPHOS,BILITOT,BILIDIR,IBILI in the last 72 hours PT/INR  Basename 02/20/12 0338 02/19/12 0401  LABPROT 30.4* 27.3*  INR 2.85* 2.49*    Assessment / Plan: #1 46 yo female with severe post ERCP pancreatitis-day # 28 hospitalization. Now with increased pain post prandially and vomiting.  CT yesterday  Reviewed-pseudocyts are all smaller, however she does have increased edema of stomach without definite outlet obstruction. Will proceed with EGD today to assess ?ulcer   Also pt cannot eat  and needs alternate nutrition at this point, discussed post pyloric feeding tube and she is agreeable-may be able to place at time of EGD later today  #2 RUE DVT's - ON coumadin #3 anemia-stable #4 malnutrition- as above Active Problems:  Acute pancreatitis  Bilateral pleural effusion  Fever  Hyponatremia  Anemia  Feeding difficulties  Nonspecific (abnormal) findings on radiological and other examination of gastrointestinal tract  Deep vein thrombosis, upper right extremity  Nausea with vomiting     LOS: 28 days   Ashlen Kiger  02/20/2012, 8:59 AM

## 2012-02-20 NOTE — Progress Notes (Signed)
I have taken an interval history, reviewed the chart and examined the patient. I agree with the extender's note, impression and recommendations.   Chicquita Mendel T. Josep Luviano MD FACG 

## 2012-02-20 NOTE — Interval H&P Note (Signed)
History and Physical Interval Note:  02/20/2012 2:13 PM  Cassie Berry  has presented today for surgery, with the diagnosis of severe pancreatits/rule out gastric outlet obstruction  The various methods of treatment have been discussed with the patient and family. After consideration of risks, benefits and other options for treatment, the patient has consented to  Procedure(s) (LRB): ESOPHAGOGASTRODUODENOSCOPY (EGD) (N/A) as a surgical intervention .  The patient's history has been reviewed, patient examined, no change in status, stable for surgery.  I have reviewed the patients' chart and labs.  Questions were answered to the patient's satisfaction.     Venita Lick. Russella Dar MD Clementeen Graham

## 2012-02-20 NOTE — Care Management Note (Unsigned)
    Page 1 of 1   02/20/2012     1:55:42 PM   CARE MANAGEMENT NOTE 02/20/2012  Patient:  Cassie Berry, Cassie Berry   Account Number:  [id number]  Date Initiated:  01/24/2012  Documentation initiated by:  Lorenda Ishihara  Subjective/Objective Assessment:   46 yo female admitted with post ERCP pancreatitis. PTA lived at home with spouse. PCP is Dr. [name]     Action/Plan:   D/C when medically stable   Anticipated DC Date:  02/25/2012   Anticipated DC Plan:  HOME/SELF CARE      DC Planning Services  CM consult      Choice offered to / List presented to:             Status of service:  In process, will continue to follow Medicare Important Message given?   (If response is "NO", the following Medicare IM given date fields will be blank) Date Medicare IM given:   Date Additional Medicare IM given:    Discharge Disposition:  HOME/SELF CARE  Per UR Regulation:  Reviewed for med. necessity/level of care/duration of stay  If discussed at Long Length of Stay Meetings, dates discussed:   02/05/2012  02/12/2012    Comments:  02/20/12 Sutton Plake RN,BSN,NCM [phone] CT ABD YESTERDAY-PSEUDOCYSTS SMALLER,INCREASED EDEMA OF STOMACH.EGD TODAY-TO R/O GASTRIC OUTLET OBSTRUCTION,?TUBE PLACEMENT.WILL SEE IF APPROPRIATE FOR LTAC SCREEN.  02/12/12, Terri Craft RNC-MNN, BSN, [phone], Pt advanced to full liquid diet and tolerated well.  New DVT at old PICC site-started on Lovenox on 02/11/12.  Remains on Morphine PCA, IVF, Zofran IV q6h and Protonix IV. 02/05/12, Terri Craft RNC-MNN, BSN, [phone] admiitted with abdominal pain, has post ERCP pancreatitis.  On full liquid diet.  Transferred to 3E on 02/02/12 from ICU-was in ICU for sepsis.  On Oxygen at 2L, Primaxin IV q 6h, Morphine PCA, IVF, IV Zofran.

## 2012-02-20 NOTE — Progress Notes (Signed)
ANTICOAGULATION CONSULT NOTE - follow up  Pharmacy Consult for Coumadin Indication: DVT  Allergies  Allergen Reactions  . Macrodantin Rash  . Nitrofurantoin    Patient Measurements: Height: 5\' 4"  (162.6 cm) Weight: 149 lb 0.4 oz (67.597 kg) IBW/kg (Calculated) : 54.7   Vital Signs: Temp: 99.2 F (37.3 C) (07/11 0640) Temp src: Oral (07/11 0640) BP: 100/61 mmHg (07/11 0640) Pulse Rate: 86  (07/11 0640)  Labs:  Basename 02/20/12 0338 02/19/12 0401 02/18/12 0335  HGB 9.1* 9.1* --  HCT 27.7* 27.2* 27.6*  PLT 511* 517* 530*  APTT -- -- --  LABPROT 30.4* 27.3* 26.4*  INR 2.85* 2.49* 2.38*  HEPARINUNFRC -- -- --  CREATININE -- -- --  CKTOTAL -- -- --  CKMB -- -- --  TROPONINI -- -- --   Estimated Creatinine Clearance: 84 ml/min (by C-G formula based on Cr of 0.46).  Medications:  Prescriptions prior to admission  Medication Sig Dispense Refill  . buPROPion (WELLBUTRIN XL) 300 MG 24 hr tablet Take 300 mg by mouth daily.      . cetirizine (ZYRTEC) 10 MG tablet Take 10 mg by mouth daily as needed. For allergies.      . cholecalciferol (VITAMIN D) 1000 UNITS tablet Take 1,000 Units by mouth daily.      Marland Kitchen esomeprazole (NEXIUM) 40 MG packet Take 40 mg by mouth daily before breakfast.  24 each  0  . ondansetron (ZOFRAN) 4 MG tablet Take 4 mg by mouth every 8 (eight) hours as needed. For nausea.      . promethazine (PHENERGAN) 25 MG tablet Take 25 mg by mouth every 6 (six) hours as needed. For nausea.      . Pyridoxine HCl (VITAMIN B-6 PO) Take 1 tablet by mouth daily.      . vitamin B-12 (CYANOCOBALAMIN) 1000 MCG tablet Take 1,000 mcg by mouth daily.       Scheduled:     . buPROPion  300 mg Oral Daily  . chlorhexidine  15 mL Mouth Rinse BID  . docusate sodium  100 mg Oral Daily  . enoxaparin (LOVENOX) injection  1 mg/kg Subcutaneous BID  . feeding supplement  237 mL Oral BID BM  . loratadine  10 mg Oral Daily  . morphine  15 mg Oral Q12H  . nystatin  5 mL Oral QID  .  ondansetron (ZOFRAN) IV  4 mg Intravenous TID AC & HS  . pantoprazole  40 mg Oral Q1200  . sodium chloride  10-40 mL Intracatheter Q12H  . warfarin  5 mg Oral ONCE-1800  . Warfarin - Pharmacist Dosing Inpatient   Does not apply q1800  . DISCONTD: nystatin  5 mL Oral QID   Assessment:  [name] admitted 6/13 with RUQ pain, underwent ERCP with sphincterotomy and insertion of pancreatic stent then developed pancreatitis with pseudocysts.  Pain and tenderness at old PICC site -> dopplers revealed DVT.  5 day overlap of Lovenox completed. Lovenox d/ced yesterday.  INR therapeutic today 2.85 but rising steadily after 2 x 7.5mg  7/6-7/7, 3 x 5 mg 7/8-7/9.  Patient with poor oral intake. Full liquid diet.  Renal fx wnl.  No bleeding reported. H/H low but stable. Pltc high/stable.  Goal of Therapy:  INR 2-3 Dose adjusted for weight and renal fxn. Monitor platelets by anticoagulation protocol: Yes   Plan:   Will hold coumadin today in anticipation of further rise tomorrow since INR has been steadily rising and patient with poor oral intake.  F/u daily INR and CBC and will adjust coumadin dose accordingly.   Education completed 7/7.  Tammy Sours, PharmD [phone] 02/20/2012 8:53 AM

## 2012-02-21 ENCOUNTER — Encounter (HOSPITAL_COMMUNITY): Payer: Self-pay

## 2012-02-21 LAB — CBC
HCT: 27.2 % — ABNORMAL LOW (ref 36.0–46.0)
MCV: 86.1 fL (ref 78.0–100.0)
RBC: 3.16 MIL/uL — ABNORMAL LOW (ref 3.87–5.11)
WBC: 8.9 10*3/uL (ref 4.0–10.5)

## 2012-02-21 MED ORDER — PHENOL 1.4 % MT LIQD
1.0000 | OROMUCOSAL | Status: DC | PRN
  Administered 2012-02-21: 1 via OROMUCOSAL
  Filled 2012-02-21: qty 177

## 2012-02-21 MED ORDER — PROMETHAZINE HCL 25 MG/ML IJ SOLN
12.5000 mg | Freq: Four times a day (QID) | INTRAMUSCULAR | Status: DC | PRN
  Administered 2012-02-21 – 2012-02-23 (×6): 12.5 mg via INTRAVENOUS
  Filled 2012-02-21 (×5): qty 1

## 2012-02-21 MED ORDER — ACETAMINOPHEN 650 MG RE SUPP
975.0000 mg | Freq: Four times a day (QID) | RECTAL | Status: DC | PRN
  Filled 2012-02-21: qty 2

## 2012-02-21 MED ORDER — LIDOCAINE VISCOUS 2 % MT SOLN
20.0000 mL | OROMUCOSAL | Status: DC | PRN
  Administered 2012-02-21: 20 mL via OROMUCOSAL
  Filled 2012-02-21: qty 20

## 2012-02-21 NOTE — Progress Notes (Signed)
ANTICOAGULATION CONSULT NOTE - follow up  Pharmacy Consult for Coumadin Indication: DVT  Allergies  Allergen Reactions  . Macrodantin Rash  . Nitrofurantoin    Patient Measurements: Height: 5\' 4"  (162.6 cm) Weight: 149 lb 0.4 oz (67.597 kg) IBW/kg (Calculated) : 54.7   Vital Signs: Temp: 99.8 F (37.7 C) (07/12 0548) Temp src: Oral (07/12 0548) BP: 110/69 mmHg (07/12 0548) Pulse Rate: 89  (07/12 0548)  Labs:  Basename 02/21/12 0357 02/20/12 0338 02/19/12 0401  HGB 8.9* 9.1* --  HCT 27.2* 27.7* 27.2*  PLT 479* 511* 517*  APTT -- -- --  LABPROT 32.5* 30.4* 27.3*  INR 3.11* 2.85* 2.49*  HEPARINUNFRC -- -- --  CREATININE -- -- --  CKTOTAL -- -- --  CKMB -- -- --  TROPONINI -- -- --   Estimated Creatinine Clearance: 84 ml/min (by C-G formula based on Cr of 0.46).  Medications:  Prescriptions prior to admission  Medication Sig Dispense Refill  . buPROPion (WELLBUTRIN XL) 300 MG 24 hr tablet Take 300 mg by mouth daily.      . cetirizine (ZYRTEC) 10 MG tablet Take 10 mg by mouth daily as needed. For allergies.      . cholecalciferol (VITAMIN D) 1000 UNITS tablet Take 1,000 Units by mouth daily.      Marland Kitchen esomeprazole (NEXIUM) 40 MG packet Take 40 mg by mouth daily before breakfast.  24 each  0  . ondansetron (ZOFRAN) 4 MG tablet Take 4 mg by mouth every 8 (eight) hours as needed. For nausea.      . promethazine (PHENERGAN) 25 MG tablet Take 25 mg by mouth every 6 (six) hours as needed. For nausea.      . Pyridoxine HCl (VITAMIN B-6 PO) Take 1 tablet by mouth daily.      . vitamin B-12 (CYANOCOBALAMIN) 1000 MCG tablet Take 1,000 mcg by mouth daily.       Scheduled:     . buPROPion  300 mg Oral Daily  . chlorhexidine  15 mL Mouth Rinse BID  . docusate sodium  100 mg Oral Daily  . feeding supplement  237 mL Oral BID BM  . loratadine  10 mg Oral Daily  . morphine  15 mg Oral Q12H  . nystatin  5 mL Oral QID  . ondansetron (ZOFRAN) IV  4 mg Intravenous TID AC & HS  .  pantoprazole  40 mg Oral BID AC  . sodium chloride  10-40 mL Intracatheter Q12H  . Warfarin - Pharmacist Dosing Inpatient   Does not apply q1800   Assessment:  [name] admitted 6/13 with RUQ pain, underwent ERCP with sphincterotomy and insertion of pancreatic stent then developed pancreatitis with pseudocysts.  Pain and tenderness at old PICC site -> dopplers revealed DVT.  5 day overlap of Lovenox completed. Lovenox d/ced 7/10.  INR supratherapeutic today 3.1 and rising steadily after 2 x 7.5mg  7/6-7/7, 3 x 5 mg 7/8-7/10. Coumadin held 7/11.  Patient with poor oral intake. Full liquid diet. Starting tube feeding.   Renal fx wnl.  No bleeding reported. H/H low but stable. Pltc high/stable.  Goal of Therapy:  INR 2-3 Dose adjusted for weight and renal fxn. Monitor platelets by anticoagulation protocol: Yes   Plan:   Will hold coumadin today again and re-evaluate tomorrow.  F/u daily INR and CBC and will adjust coumadin dose accordingly.   Education completed 7/7.  Tammy Sours, PharmD [phone] 02/21/2012 1:30 PM

## 2012-02-21 NOTE — Progress Notes (Signed)
Nutrition Brief Note  - Pt currently at 15ml/hr of Vital 1.2, which provides 1152 calories, 72g protein, meeting 64% of estimated calorie needs and 72% of estimated protein needs. Pt reports tolerating TF well, however did not want it advanced last ight r/t nausea. Pt states only when she is in pain does she get nauseated. Pt still c/o lack of BM for 1 week. Pt states the pain is not any better. Pt states she has only had cranberry juice all day to eat/drink. RD to monitor for TF advancement and tolerance.   Dietitian# [id number]

## 2012-02-21 NOTE — Progress Notes (Signed)
I have taken an interval history, reviewed the chart and examined the patient. I agree with the extender's note, impression and recommendations.   Malcolm T. Stark MD FACG 

## 2012-02-21 NOTE — Progress Notes (Signed)
At 12 midnight pt tube feed was to be increased by 10ml but pt complained she was nauseated so RN  Left feed rate at 20ml. Will try later.

## 2012-02-21 NOTE — Progress Notes (Signed)
Patient ID: Cassie Berry, female   DOB: Apr 20, 1966, 46 y.o.   MRN: [id number] [facility] Progress Note  Subjective: Throat hurts with feeding tube, tolerating 30 cc /hour. Afraid to eat because she doesn't want to vomit. Abdominal pain about the same.  Objective:  Vital signs in last 24 hours: Temp:  [98.3 F (36.8 C)-100.3 F (37.9 C)] 99.8 F (37.7 C) (07/12 0548) Pulse Rate:  [64-100] 89  (07/12 0548) Resp:  [11-23] 16  (07/12 0548) BP: (107-137)/(69-85) 110/69 mmHg (07/12 0548) SpO2:  [94 %-100 %] 94 % (07/12 0548) Last BM Date: 02/14/12 General:   Alert,  Well-developed,    in NAD Heart:  Regular rate and rhythm; no murmurs Pulm;clear Abdomen:  Soft, tender epigastrium, and ruq with fullness, and nondistended. Normal bowel sounds, without guarding, and without rebound.   Extremities:  Without edema. Neurologic:  Alert and  oriented x4;  grossly normal neurologically. Psych:  Alert and cooperative. Affect flat.  Intake/Output from previous day: 07/11 0701 - 07/12 0700 In: 601.7 [I.V.:601.7] Out: 1000 [Urine:1000] Intake/Output this shift:    Lab Results:  Basename 02/21/12 0357 02/20/12 0338 02/19/12 0401  WBC 8.9 9.1 8.1  HGB 8.9* 9.1* 9.1*  HCT 27.2* 27.7* 27.2*  PLT 479* 511* 517*    PT/INR  Basename 02/21/12 0357 02/20/12 0338  LABPROT 32.5* 30.4*  INR 3.11* 2.85*    Assessment / Plan: 46 yo female with severe post ERCP pancreatitis with multiple pseudocyts, and severe duodenal inflammatory changes and stenosis preventing oral intake. She is stable but has persistent pain, nausea, and is malnourished. Tube feedings started yesterday-post pyloric- will check KUB today to confirm correct positioning of tube, continue clears as tolerated Add chloraseptic spray, and viscous lidocaine prn #2 RUE DVTS' -on Coumadin #3 anemia-stable Active Problems:  Acute pancreatitis  Bilateral pleural effusion  Fever  Hyponatremia  Anemia  Feeding  difficulties  Nonspecific (abnormal) findings on radiological and other examination of gastrointestinal tract  Deep vein thrombosis, upper right extremity  Nausea with vomiting     LOS: 29 days   Tamas Suen  02/21/2012, 9:27 AM

## 2012-02-22 ENCOUNTER — Inpatient Hospital Stay (HOSPITAL_COMMUNITY): Payer: 59

## 2012-02-22 LAB — CBC
HCT: 26.7 % — ABNORMAL LOW (ref 36.0–46.0)
Hemoglobin: 8.8 g/dL — ABNORMAL LOW (ref 12.0–15.0)
MCV: 87.8 fL (ref 78.0–100.0)
RBC: 3.04 MIL/uL — ABNORMAL LOW (ref 3.87–5.11)
WBC: 10.2 10*3/uL (ref 4.0–10.5)

## 2012-02-22 MED ORDER — PANTOPRAZOLE SODIUM 40 MG IV SOLR
40.0000 mg | INTRAVENOUS | Status: DC
  Administered 2012-02-22 – 2012-02-25 (×4): 40 mg via INTRAVENOUS
  Filled 2012-02-22 (×5): qty 40

## 2012-02-22 MED ORDER — POLYETHYLENE GLYCOL 3350 17 G PO PACK
17.0000 g | PACK | Freq: Every day | ORAL | Status: DC
  Administered 2012-02-22 – 2012-02-27 (×6): 17 g via ORAL
  Filled 2012-02-22 (×11): qty 1

## 2012-02-22 MED ORDER — WARFARIN SODIUM 5 MG PO TABS
5.0000 mg | ORAL_TABLET | Freq: Once | ORAL | Status: AC
  Administered 2012-02-22: 5 mg via ORAL
  Filled 2012-02-22: qty 1

## 2012-02-22 NOTE — Progress Notes (Signed)
Pt complain of abdominal pain with nausea and gagging. Called on call was advised to decrease to 24mll/hr until pt feels better.

## 2012-02-22 NOTE — Progress Notes (Signed)
[facility] Progress Note  Subjective: Some increase in abd pain and nausea with increase rate of TFs last evening. Decreased from 50 to 20. Feels better this morning.  Objective:  Vital signs in last 24 hours: Temp:  [99 F (37.2 C)-99.9 F (37.7 C)] 99 F (37.2 C) (07/13 0644) Pulse Rate:  [85-94] 85  (07/13 0644) Resp:  [17-18] 17  (07/13 0644) BP: (107-116)/(67-74) 107/68 mmHg (07/13 0644) SpO2:  [95 %-97 %] 95 % (07/13 0644) Last BM Date: 02/14/12 General:   Alert,  Well-developed,  white female in NAD Heart:  Regular rate and rhythm; no murmurs Abdomen:  Soft, epigastric tenderness without rebound or guarding and nondistended. Normal bowel sounds, without guarding, and without rebound.   Extremities:  Without edema. Neurologic:  Alert and  oriented x4;  grossly normal neurologically. Psych:  Alert and cooperative. Normal mood and affect.  Intake/Output from previous day: 07/12 0701 - 07/13 0700 In: 1725 [P.O.:120; I.V.:1605] Out: 1950 [Urine:1950] Intake/Output this shift:   Lab Results:  Basename 02/22/12 0431 02/21/12 0357 02/20/12 0338  WBC 10.2 8.9 9.1  HGB 8.8* 8.9* 9.1*  HCT 26.7* 27.2* 27.7*  PLT 445* 479* 511*   PT/INR  Basename 02/22/12 0431 02/21/12 0357  LABPROT 19.9* 32.5*  INR 1.66* 3.11*    Assessment / Plan:  1. Acute pancreatitis, slowly improving. 2. Duodenal stenosis with partial obstruction. Clear liquids PO as tolerated. 3. Anemia. Stable. 4. Feeding difficulties. On TFs now. Having some pain with increase TF rate. Check KUB for tube placement. 5. DVT on Coumadin per pharmacy.   Active Problems:  Acute pancreatitis  Bilateral pleural effusion  Fever  Hyponatremia  Anemia  Feeding difficulties  Nonspecific (abnormal) findings on radiological and other examination of gastrointestinal tract  Deep vein thrombosis, upper right extremity  Nausea with vomiting   LOS: 30 days   Katarzyna Wolven T. Russella Dar MD [facility] 02/22/2012, 9:48  AM

## 2012-02-22 NOTE — Progress Notes (Signed)
ANTICOAGULATION CONSULT NOTE - follow up  Pharmacy Consult for Coumadin Indication: DVT  Allergies  Allergen Reactions  . Macrodantin Rash  . Nitrofurantoin    Patient Measurements: Height: 5\' 4"  (162.6 cm) Weight: 149 lb 0.4 oz (67.597 kg) IBW/kg (Calculated) : 54.7   Vital Signs: Temp: 99 F (37.2 C) (07/13 0644) Temp src: Oral (07/13 0644) BP: 107/68 mmHg (07/13 0644) Pulse Rate: 85  (07/13 0644)  Labs:  Basename 02/22/12 0431 02/21/12 0357 02/20/12 0338  HGB 8.8* 8.9* --  HCT 26.7* 27.2* 27.7*  PLT 445* 479* 511*  APTT -- -- --  LABPROT 19.9* 32.5* 30.4*  INR 1.66* 3.11* 2.85*  HEPARINUNFRC -- -- --  CREATININE -- -- --  CKTOTAL -- -- --  CKMB -- -- --  TROPONINI -- -- --   Estimated Creatinine Clearance: 84 ml/min (by C-G formula based on Cr of 0.46).  Assessment:  46yo F admitted 6/13 with RUQ pain, underwent ERCP with sphincterotomy and insertion of pancreatic stent then developed pancreatitis with pseudocysts.  Pain and tenderness at old PICC site -> dopplers revealed DVT.  5 day overlap of Lovenox completed. Lovenox d/ced 7/10.  INR subtherapeutic today after holding dose x 2 days for high levels.  Patient with poor oral intake. Full liquid diet. Tube feeding started 7/11.  Renal fx wnl.  No bleeding reported. H/H low but stable. Pltc high/stable.  Goal of Therapy:  INR 2-3   Plan:   Warfarin 5 mg po once tonight.  F/u daily INR and CBC and will adjust coumadin dose accordingly.   Education completed 7/7.  Clance Boll, PharmD, BCPS Pager: [phone] 02/22/2012 7:31 AM

## 2012-02-23 ENCOUNTER — Encounter (HOSPITAL_COMMUNITY): Payer: Self-pay | Admitting: Gastroenterology

## 2012-02-23 LAB — CBC
HCT: 26.9 % — ABNORMAL LOW (ref 36.0–46.0)
MCV: 86.2 fL (ref 78.0–100.0)
Platelets: 408 10*3/uL — ABNORMAL HIGH (ref 150–400)
RBC: 3.12 MIL/uL — ABNORMAL LOW (ref 3.87–5.11)
WBC: 8.5 10*3/uL (ref 4.0–10.5)

## 2012-02-23 MED ORDER — WARFARIN SODIUM 7.5 MG PO TABS
7.5000 mg | ORAL_TABLET | Freq: Once | ORAL | Status: AC
  Administered 2012-02-23: 7.5 mg via ORAL
  Filled 2012-02-23: qty 1

## 2012-02-23 NOTE — Progress Notes (Signed)
[facility] Progress Note  Subjective: Feels better. Tolerating TFs at  Objective:  Vital signs in last 24 hours: Temp:  [99 F (37.2 C)-99.7 F (37.6 C)] 99 F (37.2 C) (07/14 0505) Pulse Rate:  [89-92] 89  (07/14 0505) Resp:  [18] 18  (07/14 0505) BP: (105-110)/(62-72) 110/72 mmHg (07/14 0505) SpO2:  [96 %-98 %] 96 % (07/14 0505) Last BM Date: 02/22/12 General:   Alert, well-developed, white female in NAD Heart:  Regular rate and rhythm; no murmurs Abdomen:  Soft, mild epigastric tenderness and nondistended. Normal bowel sounds, without guarding, and without rebound.   Extremities:  Without edema. Neurologic:  Alert and  oriented x4;  grossly normal neurologically. Psych:  Alert and cooperative. Normal mood and affect.  Intake/Output from previous day: 07/13 0701 - 07/14 0700 In: -  Out: 2300 [Urine:2300] Intake/Output this shift:   Lab Results:  Basename 02/23/12 0415 02/22/12 0431 02/21/12 0357  WBC 8.5 10.2 8.9  HGB 9.0* 8.8* 8.9*  HCT 26.9* 26.7* 27.2*  PLT 408* 445* 479*   PT/INR  Basename 02/23/12 0415 02/22/12 0431  LABPROT 17.7* 19.9*  INR 1.43 1.66*    Studies/Results: Dg Abd 1 View  02/22/2012  *RADIOLOGY REPORT*  Clinical Data: Feeding tube placement.  Question whether beyond duodenal stenosis.  ABDOMEN - 1 VIEW  Comparison: CT of 02/19/2012  Findings: Feeding tube terminates at the duodenal jejunal junction, distal to the area of duodenal wall thickening.  Contrast seen within the colon.  Cholecystectomy clips.  IMPRESSION: Feeding tube terminating at the duodenal jejunal junction, distal to the area of duodenal wall thickening.  Original Report Authenticated By: Consuello Bossier, M.D.   Assessment / Plan:  1. Acute pancreatitis, slowly improving. Encouraged to ambulate and sit up in chair for longer each day. May need PT to help with deconditioning. 2. Duodenal stenosis with partial obstruction. Clear liquids PO and TFs as tolerated.  Encouraged to increase clear liquid intake. 3. Anemia. Stable.  4. Feeding difficulties. Having some pain with increase TF rate. KUB shows end of tube in good position in distal duodenum.   5. DVT on Coumadin per pharmacy.   Active Problems:  Acute pancreatitis  Bilateral pleural effusion  Fever  Hyponatremia  Anemia  Feeding difficulties  Nonspecific (abnormal) findings on radiological and other examination of gastrointestinal tract  Deep vein thrombosis, upper right extremity  Nausea with vomiting    LOS: 31 days   Cassie Berry T. Russella Dar MD [facility] 02/23/2012, 9:29 AM

## 2012-02-23 NOTE — Progress Notes (Signed)
Brief Nutrition Note   Discussed with pt what her TF and goal rate would be at home during recovery and importance of adequate nutrition for healing.  Pt is on clear liquid diet. No free water ordered.  If pt is not drinking she would need 150 ml free water QID.    Patient has nasoenteric feeding tube in place with tip of tube terminates at the duodenal jejunal junction, distal to the area of duodenal wall thickening. Vital AF 1.2 is infusing @ 40 ml/hr.  Tube feeding regimen currently providing 1152 kcal, 72 grams protein, and 778 ml H2O.  Goal rate is 65 ml/hr.  Last bm: 7/13  Kendell Bane RD, LDN, CNSC [phone] Weekend/After Hours Pager

## 2012-02-23 NOTE — Progress Notes (Signed)
ANTICOAGULATION CONSULT NOTE - follow up  Pharmacy Consult for Coumadin Indication: DVT  Allergies  Allergen Reactions  . Macrodantin Rash  . Nitrofurantoin    Patient Measurements: Height: 5\' 4"  (162.6 cm) Weight: 149 lb 0.4 oz (67.597 kg) IBW/kg (Calculated) : 54.7   Vital Signs: Temp: 99 F (37.2 C) (07/14 0505) Temp src: Oral (07/14 0505) BP: 110/72 mmHg (07/14 0505) Pulse Rate: 89  (07/14 0505)  Labs:  Basename 02/23/12 0415 02/22/12 0431 02/21/12 0357  HGB 9.0* 8.8* --  HCT 26.9* 26.7* 27.2*  PLT 408* 445* 479*  APTT -- -- --  LABPROT 17.7* 19.9* 32.5*  INR 1.43 1.66* 3.11*  HEPARINUNFRC -- -- --  CREATININE -- -- --  CKTOTAL -- -- --  CKMB -- -- --  TROPONINI -- -- --   Estimated Creatinine Clearance: 84 ml/min (by C-G formula based on Cr of 0.46).  Assessment:  46yo F admitted 6/13 with RUQ pain, underwent ERCP with sphincterotomy and insertion of pancreatic stent then developed pancreatitis with pseudocysts.  Pain and tenderness at old PICC site -> dopplers revealed DVT.  5 day overlap of Lovenox completed. Lovenox d/ced 7/10.  INR remained subtherapeutic today after holding dose x 2 days for high levels + start of tube feeds (started 7/11).  Patient has had poor oral intake.  Going to boost INR with 7.5 mg dose tonight.  May need to restart Lovenox tomorrow if INR continues to drop.  Renal fx wnl.  No bleeding reported. H/H low but stable. Pltc high/stable.  Goal of Therapy:  INR 2-3   Plan:   Warfarin 7.5 mg po once tonight. Follow up INR in AM.  Education completed 7/7.  Clance Boll, PharmD, BCPS Pager: [phone] 02/23/2012 7:59 AM

## 2012-02-24 LAB — CBC
HCT: 26.7 % — ABNORMAL LOW (ref 36.0–46.0)
MCV: 87 fL (ref 78.0–100.0)
Platelets: 444 10*3/uL — ABNORMAL HIGH (ref 150–400)
RBC: 3.07 MIL/uL — ABNORMAL LOW (ref 3.87–5.11)
WBC: 9.2 10*3/uL (ref 4.0–10.5)

## 2012-02-24 MED ORDER — PROMETHAZINE HCL 25 MG/ML IJ SOLN
12.5000 mg | INTRAMUSCULAR | Status: DC | PRN
Start: 2012-02-24 — End: 2012-03-03
  Administered 2012-02-24 – 2012-03-03 (×28): 12.5 mg via INTRAVENOUS
  Filled 2012-02-24 (×28): qty 1

## 2012-02-24 MED ORDER — ENOXAPARIN SODIUM 80 MG/0.8ML ~~LOC~~ SOLN
1.0000 mg/kg | Freq: Two times a day (BID) | SUBCUTANEOUS | Status: DC
  Administered 2012-02-24 – 2012-03-03 (×17): 70 mg via SUBCUTANEOUS
  Filled 2012-02-24 (×18): qty 0.8

## 2012-02-24 MED ORDER — BUPROPION HCL 75 MG PO TABS
150.0000 mg | ORAL_TABLET | Freq: Two times a day (BID) | ORAL | Status: DC
  Administered 2012-02-24 – 2012-03-03 (×16): 150 mg via ORAL
  Filled 2012-02-24 (×20): qty 2

## 2012-02-24 MED ORDER — VITAL AF 1.2 CAL PO LIQD
1000.0000 mL | ORAL | Status: DC
  Administered 2012-02-24: 1000 mL
  Filled 2012-02-24 (×2): qty 1000

## 2012-02-24 MED ORDER — WARFARIN SODIUM 7.5 MG PO TABS
7.5000 mg | ORAL_TABLET | Freq: Once | ORAL | Status: AC
  Administered 2012-02-24: 7.5 mg via ORAL
  Filled 2012-02-24: qty 1

## 2012-02-24 NOTE — Progress Notes (Signed)
Patient seen, examined, and I agree with the above documentation, including the assessment and plan. Still with 2 episodes of scant emesis after attempting to eat.  Vomiting relieves knot-like abd discomfort. Cont supportive care Warfarin with lovenox bridge for DVT TFs advanced today towards goal.

## 2012-02-24 NOTE — Progress Notes (Signed)
Patient ID: Cassie Berry, female   DOB: 09-18-1965, 46 y.o.   MRN: [id number] [facility] Progress Note  Subjective: Feeling stronger overall and mentally clearer- tolerating tf at 40 cc /hr past 24 hours. She did vomit x 3 yesterday-just grape juice-no tube feeding. Still having pain in epigastrium with po intake  Objective:  Vital signs in last 24 hours: Temp:  [99 F (37.2 C)-99.9 F (37.7 C)] 99 F (37.2 C) (07/15 0500) Pulse Rate:  [85-91] 85  (07/15 0500) Resp:  [16-18] 16  (07/15 0500) BP: (101-110)/(62-66) 107/66 mmHg (07/15 0500) SpO2:  [95 %-98 %] 96 % (07/15 0500) Last BM Date: 02/14/12 General:   Alert,  Well-developed,    in NAD Heart:  Regular rate and rhythm; no murmurs Pulm;clear Abdomen:  Soft, tender upper abdomen and nondistended. Normal bowel sounds,  Extremities:  Without edema. Neurologic:  Alert and  oriented x4;  grossly normal neurologically. Psych:  Alert and cooperative. Normal mood and affect.  Intake/Output from previous day: 07/14 0701 - 07/15 0700 In: -  Out: 900 [Urine:900] Intake/Output this shift: Total I/O In: 10 [I.V.:10] Out: 800 [Urine:800]  Lab Results:  Basename 02/24/12 0335 02/23/12 0415 02/22/12 0431  WBC 9.2 8.5 10.2  HGB 8.9* 9.0* 8.8*  HCT 26.7* 26.9* 26.7*  PLT 444* 408* 445*     PT/INR  Basename 02/24/12 0335 02/23/12 0415  LABPROT 17.8* 17.7*  INR 1.44 1.43      Assessment / Plan: #1 46 yo female with severe post ERCP pancreatitis with multiple pseudocyts(decreasing in size) and duodenal inflammation and stenosis preventing oral intake. Day # 32 hospital stay  She is tolerating post pyloric feeds-will increase to 45 cc /hr today(goal 65) Change Wellbutrin to 150 bid so it can be crushed #2 RUE DVT's on coumadin-inr low so also back on Lovenox for now Active Problems:  Acute pancreatitis  Bilateral pleural effusion  Fever  Hyponatremia  Anemia  Feeding difficulties  Nonspecific (abnormal)  findings on radiological and other examination of gastrointestinal tract  Deep vein thrombosis, upper right extremity  Nausea with vomiting     LOS: 32 days   Cassie Berry  02/24/2012, 10:12 AM

## 2012-02-24 NOTE — Progress Notes (Addendum)
ANTICOAGULATION CONSULT NOTE - follow up  Pharmacy Consult for Lovenox and Coumadin Indication: DVT  Allergies  Allergen Reactions  . Macrodantin Rash  . Nitrofurantoin    Patient Measurements: Height: 5\' 4"  (162.6 cm) Weight: 149 lb 0.4 oz (67.597 kg) IBW/kg (Calculated) : 54.7   Vital Signs: Temp: 99 F (37.2 C) (07/15 0500) Temp src: Oral (07/15 0500) BP: 107/66 mmHg (07/15 0500) Pulse Rate: 85  (07/15 0500)  Labs:  Basename 02/24/12 0335 02/23/12 0415 02/22/12 0431  HGB 8.9* 9.0* --  HCT 26.7* 26.9* 26.7*  PLT 444* 408* 445*  APTT -- -- --  LABPROT 17.8* 17.7* 19.9*  INR 1.44 1.43 1.66*  HEPARINUNFRC -- -- --  CREATININE -- -- --  CKTOTAL -- -- --  CKMB -- -- --  TROPONINI -- -- --   Estimated Creatinine Clearance: 84 ml/min (by C-G formula based on Cr of 0.46).  Assessment:  46yo F admitted 6/13 with RUQ pain, underwent ERCP with sphincterotomy and insertion of pancreatic stent then developed pancreatitis with pseudocysts.  Pain and tenderness at old PICC site -> dopplers revealed DVT.  5 day overlap of Lovenox completed. Lovenox d/ced 7/10.  INR remained subtherapeutic today after holding dose x 2 days for high levels (7/11-7/12) and giving coumadin 5 mg x 1 (7/13) and coumadin 7.5 x 1 (7/14) + start of tube feeds (started 7/11).  Patient has had poor oral intake.    Will re-start lovenox (previous pharmacy consult/managment since INR subtherapeutic)   Now on tube feeds  Renal fx wnl.  No bleeding reported. H/H low but stable. Pltc high/stable.  Goal of Therapy:  INR 2-3   Plan:   Warfarin 7.5 mg po once tonight.  Re-start Lovenox 70 mg SQ q 12 hours.  Follow up INR in AM.  Education completed 7/7.  Tammy Sours, PharmD Pager: [phone] 02/24/2012 8:52 AM

## 2012-02-25 ENCOUNTER — Ambulatory Visit: Payer: 59 | Admitting: Internal Medicine

## 2012-02-25 LAB — CBC
MCV: 86.1 fL (ref 78.0–100.0)
Platelets: 444 10*3/uL — ABNORMAL HIGH (ref 150–400)
RBC: 3.1 MIL/uL — ABNORMAL LOW (ref 3.87–5.11)
WBC: 11.9 10*3/uL — ABNORMAL HIGH (ref 4.0–10.5)

## 2012-02-25 LAB — PROTIME-INR: INR: 1.37 (ref 0.00–1.49)

## 2012-02-25 MED ORDER — VITAL AF 1.2 CAL PO LIQD
1000.0000 mL | ORAL | Status: DC
  Administered 2012-02-25: 1000 mL
  Filled 2012-02-25 (×2): qty 1000

## 2012-02-25 MED ORDER — WARFARIN SODIUM 7.5 MG PO TABS
7.5000 mg | ORAL_TABLET | Freq: Once | ORAL | Status: AC
  Administered 2012-02-25: 7.5 mg via ORAL
  Filled 2012-02-25: qty 1

## 2012-02-25 MED ORDER — ALUM & MAG HYDROXIDE-SIMETH 200-200-20 MG/5ML PO SUSP
30.0000 mL | Freq: Four times a day (QID) | ORAL | Status: DC | PRN
  Administered 2012-02-25 – 2012-02-26 (×4): 30 mL via ORAL
  Filled 2012-02-25 (×4): qty 30

## 2012-02-25 NOTE — Progress Notes (Signed)
Patient ID: Cassie Berry, female   DOB: Jul 03, 1966, 46 y.o.   MRN: [id number] [facility] Progress Note  Subjective: Had 2 episodes of vomiting yesterday-just small amts of clear liquid coming up. Tolerating tube feeds-pain about the same, not having pain all the time. Small bm.Up walking every day, affect brighter+  Objective:  Vital signs in last 24 hours: Temp:  [98.7 F (37.1 C)-98.8 F (37.1 C)] 98.7 F (37.1 C) (07/16 0700) Pulse Rate:  [85] 85  (07/16 0700) Resp:  [16-18] 18  (07/16 0700) BP: (104-109)/(57-66) 104/66 mmHg (07/16 0700) SpO2:  [96 %-97 %] 96 % (07/16 0700) Weight:  [144 lb 10 oz (65.6 kg)-150 lb 12.7 oz (68.4 kg)] 144 lb 10 oz (65.6 kg) (07/16 0700) Last BM Date: 02/14/12 General:   Alert,  Well-developed,    in NAD Heart:  Regular rate and rhythm; no murmurs Pulm;clear- Abdomen:  Soft,tender upper abdomen and nondistended. Normal bowel sounds,   Extremities:  Without edema. Neurologic:  Alert and  oriented x4;  grossly normal neurologically. Psych:  Alert and cooperative. Normal mood and affect.  Intake/Output from previous day: 07/15 0701 - 07/16 0700 In: 7441.7 [P.O.:170; I.V.:7271.7] Out: 3700 [Urine:3700] Intake/Output this shift:    Lab Results:  Basename 02/25/12 0345 02/24/12 0335 02/23/12 0415  WBC 11.9* 9.2 8.5  HGB 8.8* 8.9* 9.0*  HCT 26.7* 26.7* 26.9*  PLT 444* 444* 408*    PT/INR  Basename 02/25/12 0345 02/24/12 0335  LABPROT 17.1* 17.8*  INR 1.37 1.44      Assessment / Plan: 46 yo female with severe post ERCP pancreatitis-with pseudocysts and duodenal inflammation and stenosis preventing oral intake- overall she is much improved Continue tube feeds,increase to 45 cc /day, continue clears  #2 RUE DVT-continue Lovenox and coumadin until INR therapeutic  Home on tube feeds, when  She is not vomiting on a daily basis Active Problems:  Acute pancreatitis  Bilateral pleural effusion  Fever  Hyponatremia   Anemia  Feeding difficulties  Nonspecific (abnormal) findings on radiological and other examination of gastrointestinal tract  Deep vein thrombosis, upper right extremity  Nausea with vomiting     LOS: 33 days   Caleb Decock  02/25/2012, 9:11 AM

## 2012-02-25 NOTE — Progress Notes (Signed)
Patient seen, examined, and I agree with the above documentation, including the assessment and plan. Continue to advance TFs to goal as tolerated.  Increased to 50 cc/hour  late this am. Warfarin titration per pharmacy

## 2012-02-25 NOTE — Progress Notes (Signed)
Spoke with Cassie Berry at bedside. States she has not been able to tolerate feeds at goal at this time. Reports she will likely dc home with tube feeds. Discussed that [facility] Care Management will continue to follow her as she progresses. [employer] insurance does not have LTAC benefit. Will aide and assist as needed. Cassie Berry was appreciative of visit.  Raiford Noble, MSN-Ed, RN,BSN [facility] Liaison [phone]

## 2012-02-25 NOTE — Progress Notes (Signed)
ANTICOAGULATION CONSULT NOTE - follow up  Pharmacy Consult for Lovenox and Coumadin Indication: DVT  Allergies  Allergen Reactions  . Macrodantin Rash  . Nitrofurantoin    Patient Measurements: Height: 5\' 4"  (162.6 cm) Weight: 144 lb 10 oz (65.6 kg) IBW/kg (Calculated) : 54.7   Vital Signs: Temp: 98.7 F (37.1 C) (07/16 0700) Temp src: Oral (07/16 0700) BP: 104/66 mmHg (07/16 0700) Pulse Rate: 85  (07/16 0700)  Labs:  Basename 02/25/12 0345 02/24/12 0335 02/23/12 0415  HGB 8.8* 8.9* --  HCT 26.7* 26.7* 26.9*  PLT 444* 444* 408*  APTT -- -- --  LABPROT 17.1* 17.8* 17.7*  INR 1.37 1.44 1.43  HEPARINUNFRC -- -- --  CREATININE -- -- --  CKTOTAL -- -- --  CKMB -- -- --  TROPONINI -- -- --   Estimated Creatinine Clearance: 76.7 ml/min (by C-G formula based on Cr of 0.46).  Assessment:  46yo F admitted 6/13 with RUQ pain, underwent ERCP with sphincterotomy and insertion of pancreatic stent then developed pancreatitis with pseudocysts.  Pain and tenderness at old PICC site -> dopplers revealed DVT.  5 day overlap of Lovenox completed. Lovenox d/ced 7/10 and re-started 7/15.  INR remained subtherapeutic today after holding dose x 2 days for high levels (7/11-7/12) and giving coumadin 5 mg x 1 (7/13) and coumadin 7.5 x 2 (7/14-7/15) + start of tube feeds (started 7/11).  Patient has had poor oral intake.    Now on tube feeds, increasing to 45 ml/hr  Renal fx wnl.  No bleeding reported. H/H low but stable. Pltc high/stable.  Goal of Therapy:  INR 2-3   Plan:   Warfarin 7.5 mg po again tonight. If INR does not increase will increase warfarin dose tomorrow.   Continue Lovenox 70 mg SQ q 12 hours.  Follow up INR in AM.  Education completed 7/7.  Tammy Sours, PharmD Pager: [phone] 02/25/2012 8:21 AM

## 2012-02-26 LAB — COMPREHENSIVE METABOLIC PANEL WITH GFR
ALT: 35 U/L (ref 0–35)
AST: 26 U/L (ref 0–37)
Albumin: 2.7 g/dL — ABNORMAL LOW (ref 3.5–5.2)
Alkaline Phosphatase: 104 U/L (ref 39–117)
BUN: 8 mg/dL (ref 6–23)
CO2: 25 meq/L (ref 19–32)
Calcium: 8.8 mg/dL (ref 8.4–10.5)
Chloride: 100 meq/L (ref 96–112)
Creatinine, Ser: 0.37 mg/dL — ABNORMAL LOW (ref 0.50–1.10)
GFR calc Af Amer: >90 mL/min
GFR calc non Af Amer: >90 mL/min
Glucose, Bld: 124 mg/dL — ABNORMAL HIGH (ref 70–99)
Potassium: 3.9 meq/L (ref 3.5–5.1)
Sodium: 135 meq/L (ref 135–145)
Total Bilirubin: 0.1 mg/dL — ABNORMAL LOW (ref 0.3–1.2)
Total Protein: 6.4 g/dL (ref 6.0–8.3)

## 2012-02-26 LAB — CBC
HCT: 27.6 % — ABNORMAL LOW (ref 36.0–46.0)
Hemoglobin: 8.9 g/dL — ABNORMAL LOW (ref 12.0–15.0)
MCH: 28.1 pg (ref 26.0–34.0)
MCHC: 32.2 g/dL (ref 30.0–36.0)
MCV: 87.1 fL (ref 78.0–100.0)
Platelets: 460 K/uL — ABNORMAL HIGH (ref 150–400)
RBC: 3.17 MIL/uL — ABNORMAL LOW (ref 3.87–5.11)
RDW: 13.3 % (ref 11.5–15.5)
WBC: 9.6 K/uL (ref 4.0–10.5)

## 2012-02-26 LAB — PROTIME-INR: Prothrombin Time: 17.4 seconds — ABNORMAL HIGH (ref 11.6–15.2)

## 2012-02-26 LAB — LIPASE, BLOOD: Lipase: 278 U/L — ABNORMAL HIGH (ref 11–59)

## 2012-02-26 MED ORDER — PANTOPRAZOLE SODIUM 40 MG IV SOLR
40.0000 mg | Freq: Two times a day (BID) | INTRAVENOUS | Status: DC
  Administered 2012-02-26 – 2012-03-03 (×12): 40 mg via INTRAVENOUS
  Filled 2012-02-26 (×13): qty 40

## 2012-02-26 MED ORDER — VITAL AF 1.2 CAL PO LIQD
1000.0000 mL | ORAL | Status: DC
  Administered 2012-02-26 – 2012-02-28 (×3): 1000 mL
  Filled 2012-02-26 (×5): qty 1000

## 2012-02-26 MED ORDER — WARFARIN SODIUM 10 MG PO TABS
10.0000 mg | ORAL_TABLET | Freq: Once | ORAL | Status: AC
  Administered 2012-02-26: 10 mg via ORAL
  Filled 2012-02-26: qty 1

## 2012-02-26 NOTE — Progress Notes (Signed)
Patient seen, examined, and I agree with the above documentation, including the assessment and plan.  

## 2012-02-26 NOTE — Progress Notes (Signed)
Patient ID: Cassie Berry, female   DOB: 1966/06/20, 46 y.o.   MRN: [id number] [facility] Progress Note  Subjective:  tolerating tube feeds but some increased fullness and indigestion. Still vomiting intermittently, and c/o increased abdominal pain past 24 hours  Objective:  Vital signs in last 24 hours: Temp:  [98.6 F (37 C)-99.9 F (37.7 C)] 98.9 F (37.2 C) (07/17 0545) Pulse Rate:  [76-93] 93  (07/17 0545) Resp:  [18] 18  (07/17 0545) BP: (103-108)/(62-70) 108/68 mmHg (07/17 0545) SpO2:  [95 %-97 %] 95 % (07/17 0545) Weight:  [147 lb 11.3 oz (67 kg)] 147 lb 11.3 oz (67 kg) (07/17 0551) Last BM Date: 02/25/12 General:   Alert,  Well-developed,    in NAD Heart:  Regular rate and rhythm; no murmurs Pulm;clear Abdomen:  Soft, tender and nondistended. Normal bowel sounds, without guarding,  Extremities:  Without edema. Neurologic:  Alert and  oriented x4;  grossly normal neurologically. Psych:  Alert and cooperative. Normal mood and affect.  Intake/Output from previous day: 07/16 0701 - 07/17 0700 In: 3880 [I.V.:3880] Out: 2300 [Urine:2300] Intake/Output this shift:    Lab Results:  Lipase 278  Basename 02/26/12 0325 02/25/12 0345 02/24/12 0335  WBC 9.6 11.9* 9.2  HGB 8.9* 8.8* 8.9*  HCT 27.6* 26.7* 26.7*  PLT 460* 444* 444*   BMET  Basename 02/26/12 0325  NA 135  K 3.9  CL 100  CO2 25  GLUCOSE 124*  BUN 8  CREATININE 0.37*  CALCIUM 8.8   LFT  Basename 02/26/12 0325  PROT 6.4  ALBUMIN 2.7*  AST 26  ALT 35  ALKPHOS 104  BILITOT 0.1*  BILIDIR --  IBILI --   PT/INR  Basename 02/26/12 0325 02/25/12 0345  LABPROT 17.4* 17.1*  INR 1.40 1.37    Assessment / Plan: #1 46 yo female with severe post ERCP pancreatitis- day #34 hospitalization Last CT 7/10  With decreased size of multiple pseudocyts EGD 7/11 with significant duodenal inflammation and stenosis  Continue TF- increase to 55 cc /hr  Next step will be converting to oral meds-  not ready today Increase protonix to BID #2 RUE DVT- still requiring Lovenox- suspect coumadin absorption erratic Active Problems:  Acute pancreatitis  Bilateral pleural effusion  Fever  Hyponatremia  Anemia  Feeding difficulties  Nonspecific (abnormal) findings on radiological and other examination of gastrointestinal tract  Deep vein thrombosis, upper right extremity  Nausea with vomiting     LOS: 34 days   Amy Esterwood  02/26/2012, 9:40 AM

## 2012-02-26 NOTE — Progress Notes (Addendum)
Nutrition Follow-up  Intervention: When pt ready, increase TF by 5-59ml per pt preference, to goal of 50ml/hr of Vital 1.2 AF via nasoenteric feeing tube. Anti-emetics and bowel regimen per MD. Will monitor.   Assessment: Noted pt had 2 episodes of vomiting on 7/14 and reports still vomiting juice intermittently, not vomiting TF. Pt reports tolerating TF well and that her nausea is controlled. Pt reports she can feel when TF is increased because it makes her feel more bloated. Pt states she has been drinking cranberry juice. Pt reports having BM yesterday. Noted pt's weight up 3 pounds since yesterday. Noted lipase increased today from yesterday from 127 U/L to 278 U/L, unsure why.   Diet Order: Clear liquid   TF: Vital 1.2 AF running at 60ml/hr - provides 1584 calories, 99g protein - meeting 88% of estimated calorie needs and 99% of estimated protein needs   Meds: Scheduled Meds:   . buPROPion  150 mg Oral BID  . chlorhexidine  15 mL Mouth Rinse BID  . docusate sodium  100 mg Oral Daily  . enoxaparin (LOVENOX) injection  1 mg/kg Subcutaneous Q12H  . loratadine  10 mg Oral Daily  . morphine  15 mg Oral Q12H  . ondansetron (ZOFRAN) IV  4 mg Intravenous TID AC & HS  . pantoprazole (PROTONIX) IV  40 mg Intravenous Q12H  . polyethylene glycol  17 g Oral Daily  . sodium chloride  10-40 mL Intracatheter Q12H  . warfarin  10 mg Oral ONCE-1800  . warfarin  7.5 mg Oral ONCE-1800  . Warfarin - Pharmacist Dosing Inpatient   Does not apply q1800  . DISCONTD: pantoprazole (PROTONIX) IV  40 mg Intravenous Q24H   Continuous Infusions:   . dextrose 5 % and 0.45 % NaCl with KCl 10 mEq/L 100 mL/hr at 02/26/12 0548  . feeding supplement (VITAL AF 1.2 CAL) 1,000 mL (02/26/12 1150)  . DISCONTD: feeding supplement (VITAL AF 1.2 CAL) 1,000 mL (02/25/12 1218)   PRN Meds:.acetaminophen, acetaminophen, alum & mag hydroxide-simeth, diphenhydrAMINE, feeding supplement, hyoscyamine, lidocaine, morphine, morphine  injection, ondansetron, phenol, promethazine, simethicone  Labs:  CMP     Component Value Date/Time   NA 135 02/26/2012 0325   K 3.9 02/26/2012 0325   CL 100 02/26/2012 0325   CO2 25 02/26/2012 0325   GLUCOSE 124* 02/26/2012 0325   BUN 8 02/26/2012 0325   CREATININE 0.37* 02/26/2012 0325   CALCIUM 8.8 02/26/2012 0325   PROT 6.4 02/26/2012 0325   ALBUMIN 2.7* 02/26/2012 0325   AST 26 02/26/2012 0325   ALT 35 02/26/2012 0325   ALKPHOS 104 02/26/2012 0325   BILITOT 0.1* 02/26/2012 0325   GFRNONAA >90 02/26/2012 0325   GFRAA >90 02/26/2012 0325   Lipase  Date Value Range Status  02/26/2012 278* 11 - 59 U/L Final     Intake/Output Summary (Last 24 hours) at 02/26/12 1238 Last data filed at 02/26/12 1100  Gross per 24 hour  Intake   3880 ml  Output   2400 ml  Net   1480 ml   Last BM - 7/16  Weight Status:   7/16 144 lb 10 oz 7/17 147 lb 11.3 oz  Re-estimated needs:  No changes. 1800-2100 calories 100-115g protein   Nutrition Dx: Inadequate oral intake - ongoing  Goal:  1. TF to meet >/= 90% of estimated nutritional needs - currently met as close as possible with current rate  2. TF tolerance - met, pt only with minimal bloating and nausea  present before TF started, likely ongoing r/t pancreatitis   Monitor: Weights, labs, TF tolerance and advancement, nausea, BM  Dietitian# [phone]

## 2012-02-26 NOTE — Progress Notes (Signed)
ANTICOAGULATION CONSULT NOTE - follow up  Pharmacy Consult for Lovenox and Coumadin Indication: DVT  Allergies  Allergen Reactions  . Macrodantin Rash  . Nitrofurantoin    Patient Measurements: Height: 5\' 4"  (162.6 cm) Weight: 147 lb 11.3 oz (67 kg) IBW/kg (Calculated) : 54.7   Vital Signs: Temp: 98.9 F (37.2 C) (07/17 0545) Temp src: Oral (07/17 0545) BP: 108/68 mmHg (07/17 0545) Pulse Rate: 93  (07/17 0545)  Labs:  Basename 02/26/12 0325 02/25/12 0345 02/24/12 0335  HGB 8.9* 8.8* --  HCT 27.6* 26.7* 26.7*  PLT 460* 444* 444*  APTT -- -- --  LABPROT 17.4* 17.1* 17.8*  INR 1.40 1.37 1.44  HEPARINUNFRC -- -- --  CREATININE 0.37* -- --  CKTOTAL -- -- --  CKMB -- -- --  TROPONINI -- -- --   Estimated Creatinine Clearance: 83.6 ml/min (by C-G formula based on Cr of 0.37).  Assessment:  46yo F admitted 6/13 with RUQ pain, underwent ERCP with sphincterotomy and insertion of pancreatic stent then developed pancreatitis with pseudocysts.  Pain and tenderness at old PICC site -> dopplers revealed DVT.  5 day overlap of Lovenox completed. Lovenox d/ced 7/10 and re-started 7/15.  INR history: holding dose x 2 days for high levels (7/11-7/12) and giving coumadin 5 mg x 1 (7/13) and coumadin 7.5 x 2 (7/14-7/15) + start of tube feeds (started 7/11).  Patient has had poor oral intake.    Now on tube feeds, increasing to 45 ml/hr  Renal fx wnl.  No bleeding reported. H/H low but stable. Pltc high/stable.  Today INR 1.4 still subtherapeutic, will increase coumadin dose. INR slow to rise after initiation of tube feeds.  Goal of Therapy:  INR 2-3   Plan:   Coumadin 10 mg x 1 tonight.   Continue Lovenox 70 mg SQ q 12 hours.  Follow up INR in AM.  Education completed 7/7.  Tammy Sours, PharmD Pager: [phone] 02/26/2012 9:31 AM

## 2012-02-27 LAB — PROTIME-INR
INR: 1.38 (ref 0.00–1.49)
Prothrombin Time: 17.2 seconds — ABNORMAL HIGH (ref 11.6–15.2)

## 2012-02-27 MED ORDER — WARFARIN SODIUM 10 MG PO TABS
10.0000 mg | ORAL_TABLET | Freq: Once | ORAL | Status: AC
  Administered 2012-02-27: 10 mg via ORAL
  Filled 2012-02-27: qty 1

## 2012-02-27 MED ORDER — FENTANYL 25 MCG/HR TD PT72
25.0000 ug | MEDICATED_PATCH | TRANSDERMAL | Status: DC
  Administered 2012-02-27: 25 ug via TRANSDERMAL
  Filled 2012-02-27: qty 1

## 2012-02-27 NOTE — Progress Notes (Signed)
Visit at bedside today with patient. Pt remains on tube feeds and reports vomiting x1 last night. Will cont to follow.   Raiford Noble, MSN-Ed, RN,BSN [facility] Liaison  [phone]

## 2012-02-27 NOTE — Progress Notes (Signed)
ANTICOAGULATION CONSULT NOTE - follow up  Pharmacy Consult for Lovenox and Coumadin Indication: DVT  Allergies  Allergen Reactions  . Macrodantin Rash  . Nitrofurantoin    Patient Measurements: Height: 5\' 4"  (162.6 cm) Weight: 152 lb 1.9 oz (69 kg) IBW/kg (Calculated) : 54.7   Vital Signs: Temp: 99.3 F (37.4 C) (07/18 0511) Temp src: Oral (07/18 0511) BP: 101/63 mmHg (07/18 0511) Pulse Rate: 90  (07/18 0511)  Labs:  Basename 02/27/12 0325 02/26/12 0325 02/25/12 0345  HGB -- 8.9* 8.8*  HCT -- 27.6* 26.7*  PLT -- 460* 444*  APTT -- -- --  LABPROT 17.2* 17.4* 17.1*  INR 1.38 1.40 1.37  HEPARINUNFRC -- -- --  CREATININE -- 0.37* --  CKTOTAL -- -- --  CKMB -- -- --  TROPONINI -- -- --   Estimated Creatinine Clearance: 84.7 ml/min (by C-G formula based on Cr of 0.37).  Assessment:  46yo F admitted 6/13 with RUQ pain, underwent ERCP with sphincterotomy and insertion of pancreatic stent then developed pancreatitis with pseudocysts.  Pain and tenderness at old PICC site -> dopplers revealed DVT.  5 day overlap of Lovenox completed. Lovenox d/ced 7/10 and re-started 7/15.  INR history: holding dose x 2 days for high levels (7/11-7/12) and giving coumadin 5 mg x 1 (7/13) and coumadin 7.5 x 2 (7/14-7/15) + start of tube feeds (started 7/11).  Patient has had poor oral intake.    Now on tube feeds, increasing to 55 ml/hr. Patient having episodic vomiting  Renal fx wnl.  No bleeding reported. H/H low but stable. Pltc high/stable.  INR remains subtherapeutic despite increasing doses of coumadin - erratic coumadin absorption?  INR 1.38 today   Goal of Therapy:  INR 2-3   Plan:   Repeat Coumadin 10 mg x 1 tonight.   Continue Lovenox 70 mg SQ q 12 hours.  Follow up INR in AM.  Education completed 7/7.  Tammy Sours, PharmD Pager: [phone] 02/27/2012 7:43 AM

## 2012-02-27 NOTE — Progress Notes (Signed)
Patient's TF increased to 60cc/ hr.

## 2012-02-27 NOTE — Progress Notes (Signed)
Patient ID: Cassie Berry, female   DOB: 26-Oct-1965, 46 y.o.   MRN: [id number] [facility] Progress Note  Subjective: Did [facility] yesterday, vomited a small amt last pm, still c/o some heartburn intermittently. Sipping on clears  Objective:  Vital signs in last 24 hours: Temp:  [98.8 F (37.1 C)-99.9 F (37.7 C)] 99.3 F (37.4 C) (07/18 0511) Pulse Rate:  [82-94] 90  (07/18 0511) Resp:  [16-18] 16  (07/18 0511) BP: (101-113)/(63-68) 101/63 mmHg (07/18 0511) SpO2:  [94 %-98 %] 95 % (07/18 0511) Weight:  [152 lb 1.9 oz (69 kg)] 152 lb 1.9 oz (69 kg) (07/18 0511) Last BM Date: 02/26/12 General:   Alert,  Well-developed,    in NAD Heart:  Regular rate and rhythm; no murmurs Pulm;clear Abdomen:  Soft, tender  epigastrium,RUQand nondistended. Normal bowel sounds,  Extremities:  Without edema. Neurologic:  Alert and  oriented x4;  grossly normal neurologically. Psych:  Alert and cooperative. Normal mood and affect.  Intake/Output from previous day: 07/17 0701 - 07/18 0700 In: 2385 [I.V.:2385] Out: 2000 [Urine:2000] Intake/Output this shift: Total I/O In: -  Out: 900 [Urine:900]  Lab Results:  [facility] 02/26/12 0325 02/25/12 0345  WBC 9.6 11.9*  HGB 8.9* 8.8*  HCT 27.6* 26.7*  PLT 460* 444*   BMET  Basename 02/26/12 0325  NA 135  K 3.9  CL 100  CO2 25  GLUCOSE 124*  BUN 8  CREATININE 0.37*  CALCIUM 8.8   LFT  Basename 02/26/12 0325  PROT 6.4  ALBUMIN 2.7*  AST 26  ALT 35  ALKPHOS 104  BILITOT 0.1*  BILIDIR --  IBILI --   PT/INR  Basename 02/27/12 0325 02/26/12 0325  LABPROT 17.2* 17.4*  INR 1.38 1.40      Assessment / Plan: #1 46 yo female  With 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 Will increase to 60 cc /hr today Will try a duragesic patch for pain which will work for home  #2 RUE DVT's -continue  Lovenox/Coumadin #3 anemia- stable Active Problems:  Acute pancreatitis  Bilateral pleural effusion  Fever  Hyponatremia  Anemia  Feeding difficulties  Nonspecific (abnormal) findings on radiological and other examination of gastrointestinal tract  Deep vein thrombosis, upper right extremity  Nausea with vomiting     LOS: 35 days   Cassie Berry  02/27/2012, 9:46 AM

## 2012-02-28 ENCOUNTER — Inpatient Hospital Stay (HOSPITAL_COMMUNITY): Payer: 59

## 2012-02-28 LAB — PROTIME-INR: INR: 1.35 (ref 0.00–1.49)

## 2012-02-28 MED ORDER — ONDANSETRON HCL 4 MG/2ML IJ SOLN
4.0000 mg | Freq: Four times a day (QID) | INTRAMUSCULAR | Status: DC
  Administered 2012-02-28 – 2012-03-02 (×8): 4 mg via INTRAVENOUS
  Filled 2012-02-28 (×9): qty 2

## 2012-02-28 MED ORDER — WARFARIN SODIUM 7.5 MG PO TABS
15.0000 mg | ORAL_TABLET | Freq: Once | ORAL | Status: AC
  Administered 2012-02-28: 15 mg via ORAL
  Filled 2012-02-28: qty 2

## 2012-02-28 NOTE — Progress Notes (Signed)
Patient ID: Cassie Berry, female   DOB: 1966-05-27, 46 y.o.   MRN: [id number] [facility] Progress Note  Subjective: Feeling less pain overall - doesn't feel like she needs pain meds very often. She had mor vomiting and more constant nausea after starting Duragesic yesterday so patch removed last night. She is much less nauseated -but still vomited  3-4 x this am.... Not tube feeding-mostly clear  Objective:  Vital signs in last 24 hours: Temp:  [99.2 F (37.3 C)-99.7 F (37.6 C)] 99.2 F (37.3 C) (07/19 0507) Pulse Rate:  [80-85] 85  (07/19 0507) Resp:  [18] 18  (07/19 0507) BP: (105-112)/(69-75) 111/75 mmHg (07/19 0507) SpO2:  [95 %-96 %] 95 % (07/19 0507) Last BM Date: 02/27/12 General:   Alert,  Well-developed,    in NAD Heart:  Regular rate and rhythm; no murmurs Pulm;clear Abdomen:  Soft, mildly tender and nondistended. Normal bowel sounds,Extremities:  Without edema. Neurologic:  Alert and  oriented x4;  grossly normal neurologically. Psych:  Alert and cooperative. Normal mood and affect.  Intake/Output from previous day: 07/18 0701 - 07/19 0700 In: 4181.3 [I.V.:3324.3; NG/GT:857] Out: 3100 [Urine:3100] Intake/Output this shift:    Lab Results:  [facility] 02/26/12 0325  WBC 9.6  HGB 8.9*  HCT 27.6*  PLT 460*   BMET  Basename 02/26/12 0325  NA 135  K 3.9  CL 100  CO2 25  GLUCOSE 124*  BUN 8  CREATININE 0.37*  CALCIUM 8.8   LFT  Basename 02/26/12 0325  PROT 6.4  ALBUMIN 2.7*  AST 26  ALT 35  ALKPHOS 104  BILITOT 0.1*  BILIDIR --  IBILI --   PT/INR  Basename 02/28/12 0325 02/27/12 0325  LABPROT 16.9* 17.2*  INR 1.35 1.38     Assessment / Plan: 46 yo female with 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 Also with partial GOO secondary to duodenal edema and stenosis  Stop Fentanyl- did not tolerate  Morphine prn  Plain films this am-check tube position etc Continue TF at 60 cc  /hour  #2  RUE DVT"s - continue Lovenox  And coumadin Active Problems:  Acute pancreatitis  Bilateral pleural effusion  Fever  Hyponatremia  Anemia  Feeding difficulties  Nonspecific (abnormal) findings on radiological and other examination of gastrointestinal tract  Deep vein thrombosis, upper right extremity  Nausea with vomiting     LOS: 36 days   Amy Esterwood  02/28/2012, 9:52 AM

## 2012-02-28 NOTE — Progress Notes (Signed)
ANTICOAGULATION CONSULT NOTE - follow up  Pharmacy Consult for Lovenox and Coumadin Indication: DVT  Allergies  Allergen Reactions  . Macrodantin Rash  . Nitrofurantoin    Patient Measurements: Height: 5\' 4"  (162.6 cm) Weight: 152 lb 1.9 oz (69 kg) IBW/kg (Calculated) : 54.7   Vital Signs: Temp: 99.2 F (37.3 C) (07/19 0507) Temp src: Oral (07/19 0507) BP: 111/75 mmHg (07/19 0507) Pulse Rate: 85  (07/19 0507)  Labs:  [facility] 02/28/12 0325 02/27/12 0325 02/26/12 0325  HGB -- -- 8.9*  HCT -- -- 27.6*  PLT -- -- 460*  APTT -- -- --  LABPROT 16.9* 17.2* 17.4*  INR 1.35 1.38 1.40  HEPARINUNFRC -- -- --  CREATININE -- -- 0.37*  CKTOTAL -- -- --  CKMB -- -- --  TROPONINI -- -- --   Estimated Creatinine Clearance: 84.7 ml/min (by C-G formula based on Cr of 0.37).  Assessment:  46yo F admitted 6/13 with RUQ pain, underwent ERCP with sphincterotomy and insertion of pancreatic stent then developed pancreatitis with pseudocysts.  Pain and tenderness at old PICC site -> dopplers revealed DVT.  5 day overlap of Lovenox completed. Lovenox d/ced 7/10 and re-started 7/15.  INR history: holding dose x 2 days for high levels (7/11-7/12) and giving coumadin 5 mg x 1 (7/13) and coumadin 7.5 x 2 (7/14-7/15) + start of tube feeds (started 7/11).  Patient has had poor oral intake.    Now on tube feeds, increasing to 60 ml/hr. Patient having episodic vomiting  Renal fx wnl.  No bleeding reported. H/H low but stable. Pltc high/stable.  INR remains subtherapeutic despite increasing doses of coumadin - erratic coumadin absorption?  INR 1.35 today   Will be a little more aggressive with coumadin today since patient's INR is not moving.   Goal of Therapy:  INR 2-3   Plan:   Repeat Coumadin 15 mg x 1 tonight.   Continue Lovenox 70 mg SQ q 12 hours.  Follow up INR in AM.  Education completed 7/7.  Tammy Sours, PharmD Pager: [phone] 02/28/2012 7:45 AM

## 2012-02-28 NOTE — Progress Notes (Signed)
Patient had had an increase in emesis over the last 12 to 18 hours.  Emesis this a.m. Was mostly clear and thickened, with specks of curdled-looking liquid.  Night shift nurse reported that Fentanyl patch was removed shortly after midnight in an attempt to see if it was contributing to nausea.  Residuals checked and 0 residuals collected.  Will continue to monitor.

## 2012-02-28 NOTE — Progress Notes (Signed)
Patient seen, examined, and I agree with the above documentation, including the assessment and plan. Patient did not tolerate the fentanyl patch secondary to nausea. It has been discontinued Her pain overall is significantly better, and currently her pain is 0/10. This is very good news. She continues to have intermittent, and somewhat sudden onset nausea with vomiting. 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 I do not think she has a complete obstruction Abdominal film is reassuring, the tip of the nasoenteric tube is in good position Warfarin management per pharmacy

## 2012-02-28 NOTE — Progress Notes (Signed)
I agree with the above documentation, including the assessment and plan. Agree with trial of fentanyl patch.  Progressing towards goal with TFs Anticoagulation for DVT with warfarin management per pharmacy

## 2012-02-29 LAB — PROTIME-INR: Prothrombin Time: 16.5 seconds — ABNORMAL HIGH (ref 11.6–15.2)

## 2012-02-29 MED ORDER — VITAL AF 1.2 CAL PO LIQD
1000.0000 mL | ORAL | Status: DC
  Administered 2012-02-29 – 2012-03-02 (×3): 1000 mL
  Filled 2012-02-29 (×6): qty 1000

## 2012-02-29 MED ORDER — WARFARIN SODIUM 10 MG PO TABS
20.0000 mg | ORAL_TABLET | Freq: Once | ORAL | Status: AC
  Administered 2012-02-29: 20 mg via ORAL
  Filled 2012-02-29: qty 2

## 2012-02-29 MED ORDER — MORPHINE SULFATE 15 MG PO TABS
15.0000 mg | ORAL_TABLET | ORAL | Status: DC | PRN
  Administered 2012-02-29 – 2012-03-03 (×6): 15 mg via ORAL
  Filled 2012-02-29 (×7): qty 1

## 2012-02-29 NOTE — Progress Notes (Signed)
ANTICOAGULATION CONSULT NOTE - follow up  Pharmacy Consult for Lovenox and Coumadin Indication: DVT  Allergies  Allergen Reactions  . Macrodantin Rash  . Nitrofurantoin    Patient Measurements: Height: 5\' 4"  (162.6 cm) Weight: 152 lb 1.9 oz (69 kg) IBW/kg (Calculated) : 54.7   Vital Signs:    Labs:  Basename 02/29/12 0421 02/28/12 0325 02/27/12 0325  HGB -- -- --  HCT -- -- --  PLT -- -- --  APTT -- -- --  LABPROT 16.5* 16.9* 17.2*  INR 1.31 1.35 1.38  HEPARINUNFRC -- -- --  CREATININE -- -- --  CKTOTAL -- -- --  CKMB -- -- --  TROPONINI -- -- --   Estimated Creatinine Clearance: 84.7 ml/min (by C-G formula based on Cr of 0.37).  Assessment:  46yo F admitted 6/13 with RUQ pain, underwent ERCP with sphincterotomy and insertion of pancreatic stent then developed pancreatitis with pseudocysts.  Pain and tenderness at old PICC site -> dopplers revealed DVT.  5 day overlap of Lovenox completed. Lovenox d/ced 7/10 and re-started 7/15.    Now on tube feeds, increasing to goal 65 ml/hr. Patient having episodic vomiting  Renal fx wnl.  No bleeding reported. H/H low but stable. Pltc high/stable.  INR remains subtherapeutic despite increasing doses of coumadin - erratic coumadin absorption?  INR down 1.31 today despite increasing dose to 15 mg last night  Will be a  more aggressive with coumadin today since patient's INR is not moving.   No signficant drug interactions noted  Goal of Therapy:  INR 2-3   Plan:   Coumadin 20 mg x 1 tonight.   Continue Lovenox 70 mg SQ q 12 hours.  Follow up INR in AM.  Education completed 7/7.

## 2012-02-29 NOTE — Progress Notes (Signed)
[facility] Progress Note  Subjective: Continues to feel okay.  Still not taking much PO, but has not had vomiting since mid-day yesterday.  Used IV morphine once recently.  Walking and feels okay.  TFs at 55cc.  Have BMs every morning which are loose and nonbloody.  Objective:  Vital signs in last 24 hours: Temp:  [98.8 F (37.1 C)-99.1 F (37.3 C)] 99.1 F (37.3 C) 03-26-2023 2147) Pulse Rate:  [84-93] 93  03/26/2023 2147) Resp:  [18-20] 18  03-26-2023 2147) BP: (106-107)/(68-73) 106/68 mmHg 26-Mar-2023 2147) SpO2:  [95 %-96 %] 95 % 03-26-2023 2147) Last BM Date: 2012/03/25 Gen: awake, alert, NAD HEENT: anicteric, op clear CV: RRR, no mrg Pulm: CTA b/l Abd: soft, NT/ND, +BS throughout, scattered small bruising noted  Ext: no c/c/e Neuro: nonfocal    Intake/Output from previous day: March 26, 2023 0701 - 07/20 0700 In: 1913 [P.O.:360; I.V.:1553] Out: 2100 [Urine:2100] Intake/Output this shift: Total I/O In: 1927 [P.O.:360; I.V.:857; NG/GT:710] Out: 1400 [Urine:1400]  Lab Results: No results found for this basename: WBC:3,HGB:3,HCT:3,PLT:3 in the last 72 hours BMET No results found for this basename: NA:3,K:3,CL:3,CO2:3,GLUCOSE:3,BUN:3,CREATININE:3,CALCIUM:3 in the last 72 hours LFT No results found for this basename: PROT,ALBUMIN,AST,ALT,ALKPHOS,BILITOT,BILIDIR,IBILI in the last 72 hours PT/INR  Basename 02/29/12 0421 03/25/12 0325  LABPROT 16.5* 16.9*  INR 1.31 1.35    Studies/Results: Dg Abd 2 Views  March 25, 2012  *RADIOLOGY REPORT*  Clinical Data: Feeding tube placement  ABDOMEN - 2 VIEW  Comparison: 02/22/2012  Findings: Soft feeding tube has its tip at the ligament of Treitz. Bowel gas pattern is normal without evidence of ileus, obstruction or free air.  Previously administered contrast is present in the colon.  Clips in the right upper quadrant consistent with previous cholecystectomy.  No significant bony finding.  IMPRESSION: Soft feeding tube tip at the ligament of Treitz.   Original Report Authenticated By: Thomasenia Sales, M.D.    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 Also with partial GOO secondary to duodenal edema and stenosis  Doing well and progressing.  Will increase MSIR today to 15 mg po and I have asked she use this 1st, before IV morphine.   Inc TF to 65 cc/hour which is goal Add water via tube and stop IVFs   #2 RUE DVT"s - continue Lovenox And coumadin   Active Problems:  Acute pancreatitis  Bilateral pleural effusion  Fever  Hyponatremia  Anemia  Feeding difficulties  Nonspecific (abnormal) findings on radiological and other examination of gastrointestinal tract  Deep vein thrombosis, upper right extremity  Nausea with vomiting     LOS: 37 days   Jakirah Zaun M  02/29/2012, 10:39 AM

## 2012-03-01 LAB — COMPREHENSIVE METABOLIC PANEL
CO2: 27 mEq/L (ref 19–32)
Calcium: 8.7 mg/dL (ref 8.4–10.5)
Creatinine, Ser: 0.36 mg/dL — ABNORMAL LOW (ref 0.50–1.10)
GFR calc Af Amer: >90 mL/min (ref >90)
GFR calc non Af Amer: >90 mL/min (ref >90)
Glucose, Bld: 98 mg/dL (ref 70–99)

## 2012-03-01 LAB — CBC
Hemoglobin: 9 g/dL — ABNORMAL LOW (ref 12.0–15.0)
MCHC: 32.8 g/dL (ref 30.0–36.0)
RBC: 3.13 MIL/uL — ABNORMAL LOW (ref 3.87–5.11)

## 2012-03-01 MED ORDER — ONDANSETRON 8 MG PO TBDP
8.0000 mg | ORAL_TABLET | Freq: Two times a day (BID) | ORAL | Status: DC
  Administered 2012-03-01 – 2012-03-03 (×6): 8 mg via ORAL
  Filled 2012-03-01 (×8): qty 1

## 2012-03-01 MED ORDER — WARFARIN SODIUM 10 MG PO TABS
20.0000 mg | ORAL_TABLET | Freq: Once | ORAL | Status: AC
  Administered 2012-03-01: 20 mg via ORAL
  Filled 2012-03-01: qty 2

## 2012-03-01 NOTE — Progress Notes (Signed)
Pt resting in bed, visitor in. Panda tube infusing Vtal AF 1.2 @ 65. Tolerating fine.Cassie Berry states she is so ready to go home. Possible discharge is 7/23. She refused Zofran and requested phenergan for nausea. OOB ambulating with family throughout hospital. Lt. Hand IV starting to hurt pt., it appears to be reddened and alittle puffy, it was dc'd and IV team called.

## 2012-03-01 NOTE — Plan of Care (Signed)
Problem: Phase III Progression Outcomes Goal: Discharge plan remains appropriate-arrangements made Outcome: Progressing Plan is possibly being discharged 7/23

## 2012-03-01 NOTE — Progress Notes (Signed)
ANTICOAGULATION CONSULT NOTE - follow up  Pharmacy Consult for Lovenox and Coumadin Indication: DVT  Allergies  Allergen Reactions  . Macrodantin Rash  . Nitrofurantoin    Patient Measurements: Height: 5\' 4"  (162.6 cm) Weight: 153 lb 14.4 oz (69.809 kg) IBW/kg (Calculated) : 54.7   Vital Signs: Temp: 98.9 F (37.2 C) (07/21 0558) Temp src: Oral (07/21 0558) BP: 105/65 mmHg (07/21 0558) Pulse Rate: 88  (07/21 0558)  Labs:  Basename 03/01/12 0441 02/29/12 0421 02/28/12 0325  HGB 9.0* -- --  HCT 27.4* -- --  PLT 459* -- --  APTT -- -- --  LABPROT 18.3* 16.5* 16.9*  INR 1.49 1.31 1.35  HEPARINUNFRC -- -- --  CREATININE 0.36* -- --  CKTOTAL -- -- --  CKMB -- -- --  TROPONINI -- -- --   Estimated Creatinine Clearance: 85.1 ml/min (by C-G formula based on Cr of 0.36).  Assessment:  46yo F admitted 6/13 with RUQ pain, underwent ERCP with sphincterotomy and insertion of pancreatic stent then developed pancreatitis with pseudocysts.  Pain and tenderness at old PICC site -> dopplers revealed DVT.  5 day overlap of Lovenox completed. Lovenox d/ced 7/10 and re-started 7/15 for low INR.    Now on tube feeds, increasing to goal 65 ml/hr. Patient having episodic vomiting  Renal fx wnl.  No bleeding reported. H/H low but stable. Pltc high/stable.  INR remains subtherapeutic despite increasing doses of coumadin - erratic coumadin absorption?  INR Finally responded to high dose of coumadin (20 mg) last night.  Still below goal but with an increase 1.31 --> 1.49    Will repeat dose of 20 mg tonight and follow am INR.   No signficant drug interactions noted  Goal of Therapy:  INR 2-3   Plan:   Coumadin 20 mg x 1 tonight.   Continue Lovenox 70 mg SQ q 12 hours.  Follow up INR in AM.  Education completed 7/7.   Jesiah Grismer, Loma Messing PharmD 11:35 AM 03/01/2012

## 2012-03-01 NOTE — Progress Notes (Signed)
Patient seen, examined, and I agree with the above documentation, including the assessment and plan. Further improvement, no nausea or pain currently.  MSIR is helping and controlling pain, has not had to use since last PM INR still subtherapeutic.  Pharmacy working on warfarin dose.  Lovenox until therapeutic range achieved. D/c planning with home health and nursing.   Target d/c date Tuesday

## 2012-03-01 NOTE — Progress Notes (Signed)
Patient ID: Cassie Berry, female   DOB: 1966/02/15, 46 y.o.   MRN: [id number] [facility] Progress Note  Subjective: Doing well with tube feeds at 65 /hr this am, no nausea today, no vomiting yesterday  using MSIR prn for pain Unfortunately still does not seem to be absorbing Coumadin...  Objective:  Vital signs in last 24 hours: Temp:  [98.5 F (36.9 C)-99.2 F (37.3 C)] 98.9 F (37.2 C) (07/21 0558) Pulse Rate:  [88-93] 88  (07/21 0558) Resp:  [16] 16  (07/21 0558) BP: (105-118)/(65-68) 105/65 mmHg (07/21 0558) SpO2:  [94 %-98 %] 94 % (07/21 0558) Weight:  [153 lb 14.4 oz (69.809 kg)] 153 lb 14.4 oz (69.809 kg) (07/21 0558) Last BM Date: 02/28/12 General:   Alert,  Well-developed,    in NAD Heart:  Regular rate and rhythm; no murmurs Pulm;clear Abdomen:  Soft, tender epigastrium and RUQ and nondistended. Normal bowel sounds, without guarding, and without rebound.   Extremities:  Without edema. Neurologic:  Alert and  oriented x4;  grossly normal neurologically. Psych:  Alert and cooperative. Normal mood and affect.  Intake/Output from previous day: 07/20 0701 - 07/21 0700 In: 3366 [P.O.:960; I.V.:1696; NG/GT:710] Out: 4200 [Urine:4200] Intake/Output this shift:    Lab Results:  [facility] 03/01/12 0441  WBC 8.8  HGB 9.0*  HCT 27.4*  PLT 459*   BMET  Basename 03/01/12 0441  NA 136  K 4.1  CL 100  CO2 27  GLUCOSE 98  BUN 12  CREATININE 0.36*  CALCIUM 8.7   LFT  Basename 03/01/12 0441  PROT 6.5  ALBUMIN 2.6*  AST 24  ALT 27  ALKPHOS 87  BILITOT 0.1*  BILIDIR --  IBILI --   PT/INR  Basename 03/01/12 0441 02/29/12 0421  LABPROT 18.3* 16.5*  INR 1.49 1.31    Assessment / Plan: 46 yo female with 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  May be safer to just leave her on Lovenox at discharge unti she is eating etc. Will discuss  Anticipate discharge home in next couple days-will try Zofran 8 mg BID ODT which she could use at home-off all IV meds then Active Problems:  Acute pancreatitis  Bilateral pleural effusion  Fever  Hyponatremia  Anemia  Feeding difficulties  Nonspecific (abnormal) findings on radiological and other examination of gastrointestinal tract  Deep vein thrombosis, upper right extremity  Nausea with vomiting     LOS: 38 days   Amy Esterwood  03/01/2012, 8:44 AM

## 2012-03-02 LAB — PROTIME-INR
INR: 2.1 — ABNORMAL HIGH (ref 0.00–1.49)
Prothrombin Time: 23.9 seconds — ABNORMAL HIGH (ref 11.6–15.2)

## 2012-03-02 MED ORDER — WARFARIN SODIUM 7.5 MG PO TABS
15.0000 mg | ORAL_TABLET | Freq: Once | ORAL | Status: AC
  Administered 2012-03-02: 15 mg via ORAL
  Filled 2012-03-02 (×2): qty 2

## 2012-03-02 MED FILL — Promethazine HCl Inj 25 MG/ML: INTRAMUSCULAR | Qty: 1 | Status: AC

## 2012-03-02 MED FILL — Chlorhexidine Gluconate Soln 0.12%: OROMUCOSAL | Qty: 15 | Status: AC

## 2012-03-02 MED FILL — Morphine Sulfate Tab 15 MG: ORAL | Qty: 1 | Status: AC

## 2012-03-02 NOTE — Progress Notes (Addendum)
 Gastroenterology Progress Note  SUBJECTIVE: no abdominal pain, no nausea.  OBJECTIVE:  Vital signs in last 24 hours: Temp:  [99 F (37.2 C)-99.2 F (37.3 C)] 99 F (37.2 C) (07/22 0606) Pulse Rate:  [85-95] 85  (07/22 0606) Resp:  [18] 18  (07/22 0606) BP: (97-102)/(64-65) 102/64 mmHg (07/22 0606) SpO2:  [94 %-95 %] 94 % (07/22 0606) Weight:  [153 lb 11.2 oz (69.718 kg)] 153 lb 11.2 oz (69.718 kg) (07/22 0606) Last BM Date: 02/28/12 General:    Pleasant white female in NAD Heart:  Regular rate and rhythm Lungs: Respirations even and unlabored, lungs CTA bilaterally Abdomen:  Soft, nontender and nondistended. Normal bowel sounds. Extremities:  Without edema. Neurologic:  Alert and oriented,  grossly normal neurologically. Psych:  Cooperative. Normal mood and affect.   Lab Results:  [facility] 03/01/12 0441  WBC 8.8  HGB 9.0*  HCT 27.4*  PLT 459*   BMET  Basename 03/01/12 0441  NA 136  K 4.1  CL 100  CO2 27  GLUCOSE 98  BUN 12  CREATININE 0.36*  CALCIUM 8.7   LFT  Basename 03/01/12 0441  PROT 6.5  ALBUMIN 2.6*  AST 24  ALT 27  ALKPHOS 87  BILITOT 0.1*  BILIDIR --  IBILI --   PT/INR  Basename 03/02/12 0355 03/01/12 0441  LABPROT 23.9* 18.3*  INR 2.10* 1.49     ASSESSMENT / PLAN:  1. 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 Pseudocysts had significantly decreased in size on last CTscan 02/19/12.   2.  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 Patient tolerating small bowel feedings, she is now at goal rate. She wants to try some yogurt. Hopefully home tomorrow with Home Health services to manage enteral feedings. I spoke with case manager Terry at [phone]  3.  DVT, RUE. Her INR is finally up to 2.1 today but patient still needs 48 more hours of Lovenox. Per vascular's note on 02/12/12 patient will need 3-6 months of Coumadin. She will see them outpatentI spoke to pharmacist Foye [place]. Based on tomorrow's INR she  will make recommendations about home coumadin dose, next INR draw. I got her an appointment at [facility] on Friday 7/26 at 10:30. Needs to arrive 15 minutes early.                                                                                                                                                                          LOS: 39 days   [name]  03/02/2012, 8:38 AM

## 2012-03-02 NOTE — Plan of Care (Signed)
Problem: Phase III Progression Outcomes Goal: Discharge plan remains appropriate-arrangements made Outcome: Progressing PA arranging for Case manager to set up Home Health care for possible discharge 7/23

## 2012-03-02 NOTE — Progress Notes (Signed)
Chart was reviewed and patient was examined. X-rays were reviewed.    I agree with management and plans. Will talk with pharmacy regarding using xarelto.  Barbette Hair. Arlyce Dice, M.D., [facility]

## 2012-03-02 NOTE — Progress Notes (Signed)
ANTICOAGULATION CONSULT NOTE - follow up  Pharmacy Consult for Lovenox and Coumadin Indication: DVT  Allergies  Allergen Reactions  . Macrodantin Rash  . Nitrofurantoin    Patient Measurements: Height: 5\' 4"  (162.6 cm) Weight: 153 lb 11.2 oz (69.718 kg) IBW/kg (Calculated) : 54.7   Vital Signs: Temp: 99 F (37.2 C) (07/22 0606) Temp src: Oral (07/22 0606) BP: 102/64 mmHg (07/22 0606) Pulse Rate: 85  (07/22 0606)  Labs:  [facility] 03/02/12 0355 03/01/12 0441 02/29/12 0421  HGB -- 9.0* --  HCT -- 27.4* --  PLT -- 459* --  APTT -- -- --  LABPROT 23.9* 18.3* 16.5*  INR 2.10* 1.49 1.31  HEPARINUNFRC -- -- --  CREATININE -- 0.36* --  CKTOTAL -- -- --  CKMB -- -- --  TROPONINI -- -- --   Estimated Creatinine Clearance: 85.1 ml/min (by C-G formula based on Cr of 0.36).  Assessment:  46yo F admitted 6/13 with RUQ pain, underwent ERCP with sphincterotomy and insertion of pancreatic stent then developed pancreatitis with pseudocysts.  Pain and tenderness at old PICC site -> dopplers revealed DVT.  5 day overlap of Lovenox completed. Lovenox d/ced 7/10 and re-started 7/15 for low INR.    Now on tube feeds, increasing to goal 65 ml/hr. Patient having episodic vomiting  Renal fx wnl.  No bleeding reported. H/H low but stable. Pltc high/stable.  INR remains subtherapeutic despite increasing doses of coumadin - erratic coumadin absorption?  INR Finally responded to high doses of coumadin (20 mg x 2 on 7/20 and 7/21).  INR now therapeutic but has taken a big jump 1.31 --> 1.49 --> 2.1  No signficant drug interactions noted  Goal of Therapy:  INR 2-3   Plan:   Coumadin 15 mg x 1 tonight.   Continue Lovenox 70 mg SQ q 12 hours.  Follow up INR in AM.  Education completed 7/7.   Lodema Hong PharmD 7:27 AM 03/02/2012

## 2012-03-02 NOTE — Progress Notes (Signed)
Nutrition Brief Note  - Pt at goal rate of TF of Vital 1.2 AF at 39ml/hr. Pt states she only c/o nausea when she is in pain. Pt reports she was told she can eat yogurt but not much else when it comes to solid food. Pt states she is not drinking enough by mouth to maintain hydration. Discussed recommended TF flushes and encouraged pt to flush tube with free water 4 times daily to meet total fluid needs from TF and flushes. Pt expressed understanding. Pt smiling and eager to go home, talked happily about having her son come later on tonight to help her pack. Will monitor for any further nutrition concerns before discharge.  Dietitian# [phone]

## 2012-03-02 NOTE — Progress Notes (Signed)
03/02/12, Kathi Der RNC-MNN, BSN, [phone], CM received referral.  CM met with pt and offered choice for [facility] service.  Pt chose [facility].  Darl Pikes at [facility] contacted with order and confirmation received.

## 2012-03-02 NOTE — Progress Notes (Signed)
Visit with Cassie Berry at bedside. Plans to discharge home tomorrow with [facility] on tube feeds, arranged by inpatient case manager. Made Cassie Gergely aware that [facility] Care Management will contact her when she discharges home and will possibly follow up either by phone or visit monthly if needed. Reinforced to her that she can contact this hospital liaison in the future for any questions or concerns. Appreciative of visit. Confirmed the best contact number for her [phone].  Raiford Noble, MSN- Ed, RN,BSN [facility] Liaison [phone]

## 2012-03-02 NOTE — Plan of Care (Signed)
Problem: Phase III Progression Outcomes Goal: Discharge plan remains appropriate-arrangements made Outcome: Adequate for Discharge Anticipating discharge 7/23

## 2012-03-02 NOTE — Progress Notes (Signed)
Pt anxiously awaiting to be discharged tommorrow. She is walking the halls and tolerating it well. Panda tube infusing Vital AF 1.2 @ 65. Home health care arrangements to be made before discharge. Pt. States her insurance will not cover the tube feedings, states she will speak to the MD> tomorrow about it. Coumadin 15 mg. Given @ 1800.

## 2012-03-03 LAB — PROTIME-INR: INR: 2.18 — ABNORMAL HIGH (ref 0.00–1.49)

## 2012-03-03 MED ORDER — WARFARIN - PHARMACIST DOSING INPATIENT: 1.0000 | Freq: Every day | Status: DC

## 2012-03-03 MED ORDER — ENOXAPARIN SODIUM 150 MG/ML ~~LOC~~ SOLN: 1.0000 mg/kg | Freq: Once | SUBCUTANEOUS | Status: DC

## 2012-03-03 MED ORDER — WARFARIN SODIUM 10 MG PO TABS: 10.0000 mg | ORAL_TABLET | Freq: Every day | ORAL | Status: DC

## 2012-03-03 MED ORDER — PROMETHAZINE HCL 25 MG PO TABS: 25.0000 mg | ORAL_TABLET | Freq: Four times a day (QID) | ORAL | Status: DC | PRN

## 2012-03-03 MED ORDER — WARFARIN SODIUM 10 MG PO TABS
10.0000 mg | ORAL_TABLET | Freq: Every day | ORAL | Status: DC
  Filled 2012-03-03: qty 1

## 2012-03-03 MED ORDER — VITAL AF 1.2 CAL PO LIQD: 1000.0000 mL | ORAL | Status: DC

## 2012-03-03 MED ORDER — MORPHINE SULFATE 15 MG PO TABS: 15.0000 mg | ORAL_TABLET | ORAL | Status: DC | PRN

## 2012-03-03 NOTE — Progress Notes (Signed)
[facility] Progress Note  SUBJECTIVE: mild nausea with yogurt, otherwise okay  OBJECTIVE:  Vital signs in last 24 hours: Temp:  [98.3 F (36.8 C)-99.1 F (37.3 C)] 99.1 F (37.3 C) (07/23 0547) Pulse Rate:  [85-95] 87  (07/23 0547) Resp:  [16-18] 16  (07/23 0547) BP: (102-114)/(63-65) 102/65 mmHg (07/23 0547) SpO2:  [92 %-97 %] 92 % (07/23 0547) Weight:  [153 lb (69.4 kg)] 153 lb (69.4 kg) (07/23 0547) Last BM Date: 02/29/12 General:    white female in NAD Heart:  Regular rate and rhythm; no murmurs Lungs: Respirations even and unlabored, lungs CTA bilaterally Abdomen:  Soft, nontender and nondistended. Normal bowel sounds. Extremities:  Without edema. Neurologic:  Alert and oriented,  grossly normal neurologically. Psych:  Cooperative. Normal mood and affect.   Lab Results:  PT/INR  Basename 03/03/12 0445 03/02/12 0355  LABPROT 24.6* 23.9*  INR 2.18* 2.10*     ASSESSMENT / PLAN:  1. 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 Pseudocysts had significantly decreased in size on last CTscan 02/19/12. She is stable for discharge today. Home Health services have been arranged for enteral feedings. I spoke with case manager Terry at [phone]   2. 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 Patient tolerating small bowel feedings, she is now at goal rate. She tolerated yogurt.  3. DVT, RUE. Her INR is up to 2.18. Per vascular's note on 02/12/12 patient will need 3-6 months of Coumadin. She will see them outpatient. Spoke with pharmacy who advised 10mg  Coumadin until seen in Coumadin clinic on Friday. She will continue Lovenox through today.  I got her an appointment at [facility] on Friday 7/26 at 10:30. Needs to arrive 15 minutes early.   Discharge >69minutes   LOS: 40 days   [name]  03/03/2012, 10:04 AM

## 2012-03-03 NOTE — Discharge Summary (Signed)
[facility] Discharge Summary  Name: Cassie Berry MRN: [id number] DOB: August 29, 1965 46 y.o. PCP:  Londell Moh, MD  Date of Admission: 01/23/2012 12:12 PM Date of Discharge: 03/03/2012 Attending Physician: Louis Meckel, MD  Discharge Diagnosis:  1. 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   2, Nausea and vomiting secondary to gastric edema and distal duodenal stenosis.   3. Malnutrition secondary to #2, s/p small bowel feeding tube placement   4. DVT, RUE. Home on Coumadin.  5. Depression, stable at discharge. Home on Wellbutrin.    Consultations:   1. Vascular Surgery for DVT 2. Infectious Disease for fevers 3. [facility] for abnormal CXR  Procedures Performed:  Dg Chest 2 View  02/06/2012  *RADIOLOGY REPORT*  Clinical Data: Follow-up pleural effusions.  Pancreatitis.  CHEST - 2 VIEW  Comparison: 01/30/2012  Findings: Resolution of the left pleural effusion.  There is a bibasilar atelectasis persist.  Heart is normal size.  No acute bony abnormality.  Right PICC line has slightly retracted with the tip likely in the right innominate vein.  IMPRESSION: Interval resolution of left effusion.  Areas of bibasilar atelectasis.  Original Report Authenticated By: Cyndie Chime, M.D.   Dg Abd 1 View  02/22/2012  *RADIOLOGY REPORT*  Clinical Data: Feeding tube placement.  Question whether beyond duodenal stenosis.  ABDOMEN - 1 VIEW  Comparison: CT of 02/19/2012  Findings: Feeding tube terminates at the duodenal jejunal junction, distal to the area of duodenal wall thickening.  Contrast seen within the colon.  Cholecystectomy clips.  IMPRESSION: Feeding tube terminating at the duodenal jejunal junction, distal to the area of duodenal wall thickening.  Original Report Authenticated By: Consuello Bossier, M.D.   Ct Abdomen Pelvis W Contrast  02/19/2012  *RADIOLOGY REPORT*  Clinical Data: Persistent nausea and vomiting.  Pancreatitis.  Pseudocysts.  CT ABDOMEN AND PELVIS WITH CONTRAST  Technique:  Multidetector CT imaging of the abdomen and pelvis was performed following the standard protocol during bolus administration of intravenous contrast.  Contrast: 80mL OMNIPAQUE IOHEXOL 300 MG/ML  SOLN  Comparison: 07/01, 06/19, and 01/25/2012  Findings: Since the prior exam the patient has developed increased edema of the mucosa of the antrum of the stomach and of the region of the pylorus and duodenal bulb.  All of the pseudocysts have significantly diminished in size. There are no new pseudocysts.  Common bile duct and pancreatic duct are stable and not enlarged.  Gallbladder has been removed.  The free fluid in the pelvis has significantly diminished.  Chronic mucosal thickening of the terminal ileum and cecum consistent the patient's history of Crohn's disease is unchanged. The mucosal thickening extends to the hepatic flexure of the colon and into the transverse portion of the colon.  Minimal atelectasis is present at the left lung base, stable.  No effusions.  No acute osseous abnormality.  IMPRESSION:  1.  Overall significant improvement in the appearance of the abdomen and pelvis since the prior study with appreciable substantial decrease in the size of the numerous pancreatic pseudocysts as well as decrease in free fluid in the pelvis. 2.  Increased edema of the mucosa of the antrum and pylorus and duodenal bulb without evidence of obstruction of the stomach.  Original Report Authenticated By: Gwynn Burly, M.D.   Ct Abdomen Pelvis W Contrast  02/10/2012  *RADIOLOGY REPORT*  Clinical Data: Follow up pancreatitis, status post ERCP with sphincterotomy on 01/23/2012, nausea/vomiting, right upper quadrant abdominal pain  CT ABDOMEN AND  PELVIS WITH CONTRAST  Technique:  Multidetector CT imaging of the abdomen and pelvis was performed following the standard protocol during bolus administration of intravenous contrast.  Contrast:  100 ml  Omnipaque-300 IV  Comparison: 01/29/2012  Findings: Lung bases are essentially clear.  Liver, spleen, and adrenal glands are unremarkable.  Extensive peripancreatic inflammatory changes.  No evidence of pancreatic necrosis.  Scattered developing loculated fluid collections/pseudocysts.  For example: --5.1 x 6.8 x 6.6 cm collection inferior to the right liver (series 2/image 31) --above collection also extends anteriorly/inferiorly, measuring 2.8 x 8.1 cm (series 2/image 38) --2.0 x 2.6 cm collection inferior to the pancreatic neck (series 2/image 34) --3.0 x 4.8 cm collection inferior to the pancreatic body/tail (series 2/image 34) --Vague 2.5 x 5.9 cm collection in the right abdominal mesentery (series 2/image 49)  Status post cholecystectomy.  No intrahepatic or extrahepatic ductal dilatation.  Kidneys are within normal limits.  Mild right hydronephrosis.  No evidence of bowel obstruction. Secondary wall thickening/inflammatory changes involving the right colon (series 2/image 46).  No evidence of abdominal aortic aneurysm.  Uterus and bilateral ovaries are unremarkable.  Bladder is within normal limits.  Moderate pelvic ascites (series 2/image 74), mildly increased, with possible early thin enhancing rim (series 2/image 77).  IMPRESSION:  Extensive peripancreatic inflammatory changes.  No evidence of pancreatic necrosis.  Scattered developing loculated fluid collections/pseudocyst, as described above.  Dominant pseudocyst measures 5.1 x 6.8 x 6.6 cm inferior to the right liver.  Increasing pelvic ascites with possible early thin enhancing rim.  Secondary wall thickening/inflammatory changes involving the right colon.  Original Report Authenticated By: Charline Bills, M.D.   Dg Abd 2 Views  02/28/2012  *RADIOLOGY REPORT*  Clinical Data: Feeding tube placement  ABDOMEN - 2 VIEW  Comparison: 02/22/2012  Findings: Soft feeding tube has its tip at the ligament of Treitz. Bowel gas pattern is normal without  evidence of ileus, obstruction or free air.  Previously administered contrast is present in the colon.  Clips in the right upper quadrant consistent with previous cholecystectomy.  No significant bony finding.  IMPRESSION: Soft feeding tube tip at the ligament of Treitz.  Original Report Authenticated By: Thomasenia Sales, M.D.    GI Procedures:   EGD by Dr. Christella Hartigan for removal of pancreatic stent.   EGD (Dr. Russella Dar) with placement of small bowel feeding tube beyond distal duodenum where there was stenosis.Marland Kitchen  History/Physical Exam:  See Admission H&P  Admission HPI:  Done by Dr. Arlyce Dice. Cassie Berry is a 46 y.o. female followed by Dr. Juanda Chance for persistent right upper quadrant pain with only minor elevation of liver tests. Prior workup including ultrasound was negative. She is status post cholecystectomy. Today she underwent ERCP with sphincterotomy and insertion of a pancreatic stent. She initially awakened feeling well but has since developed nausea and moderate midepigastric pain. She received Indocin 100 mg suppository pre-procedure.   Hospital Course by problem list: 1. Severe post-ERCP pancreatitis with SIRS complicated by pseudocysts. 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 Post procedure she developed severe abdominal pain necessitating admission for further evaluation and treatment. Patient was made n.p.o. IV fluids were started, she was given IV pain medications and IV antiemetics. Admission lipase elevated at 237, amylase elevated 114. Her transaminases went from normal to an AST of 267 and ALT of 166. Initial white count was 14.2. Patient's epigastric pain progressed and she developed right upper quadrant pain  as well. CT scan of the abdomen and pelvis was obtained and showed that the biliary stent was in place but in an unusual position. The stent did not track into the pancreatic duct. There was  a question of whether the stent was intraparenchymal. Other findings included extensive edema and inflammation around the pancreas and moderate volume ascites, no pancreatic necrosis. Patient had an EGD with stent removal by Dr. Christella Hartigan. Following that, her right upper quadrant pain improved. Patient continued to have the epigastric pain and nausea however. She developed fevers and chills and became hypoxic when moving around. Supplemental oxygen started. We started a PCA pump for better pain control. Unfortunately patient lost her IV access and a PICC line had to be placed. Following that,  IV imipenem was started for fevers. Patient continued to run fevers over 102 degrees for the following 48 hours. CXR revealed LLL atelectasis vrs infiltrate with a small left effusion. On the 7th day of hospitalization rapid response was called for tachycardia, fevers >103, rigors and altered mental status. For evaluation on ongoing fevers a paracentesis with fluid studies was ordered. Urinalysis was unremarkable. A repeat  CXR was worrisome for bronchopneumonia.  Pulmonary Medicine was asked to evaluate. Dr. Sherene Sires saw the patient, there was no convincing evidence for pneumonia. A repeat CXR a couple of days later revealed bilateral pulmonary effusions. Patient continued to have high fevers and rigors. Her rigors responded to Demerol. She was transferred to the intensive care unit for close monitoring.  We questioned whether fevers could be related to Dilaudid so we switched her to morphine. Infectious Disease evaluated and agreed with workup thus far. There was insufficient ascites to tap. Blood and urine cultures were negative, After two days in ICU patient was moved to a medical bed. Her fevers were only low grade at that point. Her white count remained elevated for several days, infectious disease felt it was transient sepsis. Patient's abdominal pain slowly improved and her she was switched to oral pain meds. Unfortunately  nausea became a significant problem for her.  She spent the next few days on liquids. By the thirteenth day of admission patient was tolerating yogurt and ambulating in the halls. Her supplemental oxygen was discontinued, her white count fell from 18 to 13 and Infectious disease discontinued antibiotics. Patient was advanced to, and tolerated solids, she only required oral pain meds when eating. Unfortunately patient developed a right upper extremity DVT at PICC site. Then she developed recurrent nausea and vomiting. Vascular surgery was consulted for DVT .Pharmacy initiated Lovenox and Coumadin. Patient's nausea and vomiting continued so we repeated a CT scan which revealed  extensive peripancreatic inflammatory changes without necrosis. Scattered developing loculated fluid collections/pseudocyst were seen along with increasing pelvic ascites and secondary wall thickening/inflammatory changes involving the right colon. Despite these setbacks, patient did not have significant abdominal pain other than when she attempted solids. Nausea and poor appetite remained problematic for days despite around the clock anti-emetics.Patient got to the point of being unable to even tolerate liquids. She ultimately had a repeat CTscan which actually showed that the pseudocyst were smaller but she had developed increased edema of the stomach, pylorus and duodenal bulb. At this point a decision needed to be made regarding nutrition since resolution of the edema could take some time. Patient underwent EGD with small bowel feeding tube placement beyond the area of stenosis.  Nutritionist was consult for initiation of enteral feedings. Patient was not able to tolerate goal rate for several  days. Finally, by 03/01/12 her tube feeds were at goal rate and with any further nausea or vomiting. Discharge preparations were started . Home health services would arrange for a feeding pump and instruct patient on tube feeds and tube flushes. Her  INR was therapeutic by time of discharge. Patient was given an appointment for the [facility]. She was also given a followup appointment to see Dr. Juanda Chance, her primary gastroenterologist.      2. Nausea and vomiting secondary to gastric / duodenal edema and stenosis, see #1  3. Malnutrition secondary to #2, s/p small bowel feeding tube placement. See #1. Patient was at goal rate by time of discharge.  Home Health arranged for feeding pump at home.   4. DVT, RUE. Discharged on Coumadin with therapeutic INR. She needed one additional dose of Lovenox the evening of discharge. [facility] RN at [facility] would provide instructions on administration. Patient was given an appointment with [facility]. She would also follow up with Vascular surgery who recommended 3-6 months of anticoagulation .   5. Depression, takes Wellbutrin at home. Patient had some depressed moods this admission but that is certainly understandable given her prolonged hospitalization.  By time of discharge she was in good spirits.   Discharge Vitals:  BP 102/65  Pulse 87  Temp 99.1 F (37.3 C) (Oral)  Resp 16  Ht 5\' 4"  (1.626 m)  Wt 153 lb (69.4 kg)  BMI 26.26 kg/m2  SpO2 92%  Discharge Labs:  Results for orders placed during the hospital encounter of 01/23/12 (from the past 24 hour(s))  PROTIME-INR     Status: Abnormal   Collection Time   03/03/12  4:45 AM      Component Value Range   Prothrombin Time 24.6 (*) 11.6 - 15.2 seconds   INR 2.18 (*) 0.00 - 1.49    Disposition and follow-up:   Ms.Cassie Berry was discharged from [facility] in stable condition.    Follow-up Appointments: Discharge Orders    Future Appointments: Provider: Department: Dept Phone: Center:   03/06/2012 10:30 AM [facility] [phone] None   03/10/2012 9:30 AM Meredith Pel, NP [facility] [phone] [facility]     Future Orders Please Complete By Expires    Call MD for:  temperature >100.5      Increase activity slowly      Comments:   Walk with assistance use walker or cane as needed      Discharge Medications: Medication List  As of 03/03/2012 10:56 AM   STOP taking these medications         ondansetron 4 MG tablet         TAKE these medications         buPROPion 300 MG 24 hr tablet   Commonly known as: WELLBUTRIN XL   Take 300 mg by mouth daily.      cetirizine 10 MG tablet   Commonly known as: ZYRTEC   Take 10 mg by mouth daily as needed. For allergies.      cholecalciferol 1000 UNITS tablet   Commonly known as: VITAMIN D   Take 1,000 Units by mouth daily.      enoxaparin 150 MG/ML injection   Commonly known as: LOVENOX   Inject 0.45 mLs (70 mg total) into the skin once.      esomeprazole 40 MG packet   Commonly known as: NEXIUM   Take 40 mg by mouth daily before  breakfast.      feeding supplement (VITAL AF 1.2 CAL) Liqd   Place 1,000 mLs into feeding tube continuous.      morphine 15 MG tablet   Commonly known as: MSIR   Take 1 tablet (15 mg total) by mouth every 4 (four) hours as needed for pain.      promethazine 25 MG tablet   Commonly known as: PHENERGAN   Take 25 mg by mouth every 6 (six) hours as needed. For nausea.      vitamin B-12 1000 MCG tablet   Commonly known as: CYANOCOBALAMIN   Take 1,000 mcg by mouth daily.      VITAMIN B-6 PO   Take 1 tablet by mouth daily.      Warfarin - Pharmacist Dosing Inpatient Misc   1 each by Does not apply route daily at 6 PM.           Discharge >27minutes Signed: [name] 03/03/2012, 10:56 AM

## 2012-03-03 NOTE — Progress Notes (Signed)
Pt D/C home with home health, pt states she is a lot better, pain/nausea  Improved, pt has no new complains. D/C instructions and medication administration instructions done. Pt is alert and oriented. On room air vital signs within normal limit for pt.

## 2012-03-06 ENCOUNTER — Ambulatory Visit (INDEPENDENT_AMBULATORY_CARE_PROVIDER_SITE_OTHER): Payer: 59 | Admitting: Cardiology

## 2012-03-06 DIAGNOSIS — I82621 Acute embolism and thrombosis of deep veins of right upper extremity: Secondary | ICD-10-CM

## 2012-03-06 DIAGNOSIS — Z7901 Long term (current) use of anticoagulants: Secondary | ICD-10-CM

## 2012-03-06 DIAGNOSIS — I82629 Acute embolism and thrombosis of deep veins of unspecified upper extremity: Secondary | ICD-10-CM

## 2012-03-06 MED ORDER — ENOXAPARIN SODIUM 120 MG/0.8ML ~~LOC~~ SOLN: 120.0000 mg | Freq: Every day | SUBCUTANEOUS | Status: DC

## 2012-03-09 ENCOUNTER — Ambulatory Visit: Payer: Self-pay

## 2012-03-09 DIAGNOSIS — I82621 Acute embolism and thrombosis of deep veins of right upper extremity: Secondary | ICD-10-CM

## 2012-03-09 DIAGNOSIS — Z7901 Long term (current) use of anticoagulants: Secondary | ICD-10-CM

## 2012-03-10 ENCOUNTER — Encounter: Payer: Self-pay | Admitting: Nurse Practitioner

## 2012-03-10 ENCOUNTER — Ambulatory Visit (INDEPENDENT_AMBULATORY_CARE_PROVIDER_SITE_OTHER): Payer: 59 | Admitting: Nurse Practitioner

## 2012-03-10 VITALS — BP 110/76 | HR 88 | Ht 64.0 in | Wt 153.4 lb

## 2012-03-10 DIAGNOSIS — R633 Feeding difficulties: Secondary | ICD-10-CM

## 2012-03-10 DIAGNOSIS — K859 Acute pancreatitis without necrosis or infection, unspecified: Secondary | ICD-10-CM

## 2012-03-10 DIAGNOSIS — R112 Nausea with vomiting, unspecified: Secondary | ICD-10-CM

## 2012-03-10 MED ORDER — MORPHINE SULFATE 15 MG PO TABS: 15.0000 mg | ORAL_TABLET | ORAL | Status: AC | PRN

## 2012-03-10 NOTE — Patient Instructions (Signed)
We have given you a prescription for the MSIR Morphine to take to your pharmacy.

## 2012-03-10 NOTE — Progress Notes (Signed)
BURNADETTE BASKETT [id number] 1965/09/15   HISTORY OR PRESENT ILLNESS :  Cassie Berry is a 46 year old female known to Dr. Juanda Chance. She is here for a post hospital followup. 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 She had a prolonged hospital stay as pancreatitis was complicated by pseudocysts, high fevers with associated mental status changes, nausea and vomiting secondary to duodenal stenosis, and a right upper extremity DVT for which she is now on anti-coagulants. Lylianna still has her small bowel feeding tube in place, she is tolerating tube feedings at goal rate of 65 cc an hour. She continues to eat small amounts of yogurt, potatoes and other soft foods. She still gets abdominal pain with oral intake. She still has frequent nausea without vomiting. No fever. [facility] is monitoring her INR. Last INR was apparently subtherapeutic as patient has been restarted on Lovenox injections.   Current Medications, Allergies, Past Medical History, Past Surgical History, Family History and Social History were reviewed in [employer] record.   PHYSICAL EXAMINATION : General:  Pleasant ,well developed  female in no acute distress. Small bowel feeding tube capped Head: Normocephalic and atraumatic Eyes:  sclerae anicteric,conjunctive pink. Ears: Normal auditory acuity Neck: Supple, no masses.  Lungs: Clear throughout to auscultation Heart: Regular rate and rhythm; no murmurs heard Abdomen: Soft, nondistended, nontender. No masses or hepatomegaly noted. Normal bowel sounds Musculoskeletal: Symmetrical with no gross deformities  Skin: No lesions on visible extremities Extremities: No edema or deformities noted Neurological: Oriented x 4, grossly nonfocal Cervical Nodes:  No significant cervical adenopathy Psychological:  Alert and cooperative. Normal mood and affect  ASSESSMENT AND  PLAN :  56. 46 year old female with severe post ERCP pancreatitis complicated by pseudocysts. Pseudocysts had decreased in size at time of last CAT scan on 02/19/12. She will likely need repeat imaging at some point.   2. Nausea, upper abdominal pain with meals. CTscan 02/19/12 showed increased mucosal edema of antrum, pylorus and duodenal bulb. EGD revealed distal duodenal stenosis. We placed a small bowel feeding tube beyond stenotic area. Patient is tolerating enteral feedings at goal rate of 65 cc an hour. Continue anti-emetics. Continue enteral feedings until able to tolerate enough oral intake to sustain nutrition.  Patient will followup with Dr. Juanda Chance, her primary gastroenterologist, in approximately 4 weeks.   3. DVT, right upper extremity at site of previous PICC line. Inpatient pharmacy had difficulty getting INR therapeutic prior to discharge. For unclear reasons, her INR has fallen to subtherapeutic levels again. [facility] has resumed Lovenox injections until INR is therapeutic. Vascular surgery, Dr. Imogene Burn saw patient in the hospital. He recommended 3-6 months of anticoagulation. Patient will need followup with vascular clinic

## 2012-03-10 NOTE — Progress Notes (Signed)
I heve seen Ms Cassie Berry with [facility] and discussed plans.

## 2012-03-11 ENCOUNTER — Ambulatory Visit: Payer: Self-pay | Admitting: Cardiovascular Disease

## 2012-03-11 ENCOUNTER — Telehealth: Payer: Self-pay | Admitting: Internal Medicine

## 2012-03-11 DIAGNOSIS — Z7901 Long term (current) use of anticoagulants: Secondary | ICD-10-CM

## 2012-03-11 DIAGNOSIS — I82621 Acute embolism and thrombosis of deep veins of right upper extremity: Secondary | ICD-10-CM

## 2012-03-11 NOTE — Telephone Encounter (Signed)
Pt would like to have a portable pump for her feedings. 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 [facility] to see if we can get the portable pump for the pt. Per [facility] they were told the pt would only be on the feedings for a limited amount of time. They have also switched pumps out and the portable pumps will not be available until the end of August except for infants. Left message for Raynelle Fanning letting her know.

## 2012-03-12 ENCOUNTER — Telehealth: Payer: Self-pay | Admitting: Nurse Practitioner

## 2012-03-12 MED ORDER — PANCRELIPASE (LIP-PROT-AMYL) 36000-114000 UNITS PO CPEP: 2.0000 | ORAL_CAPSULE | Freq: Three times a day (TID) | ORAL | Status: DC

## 2012-03-12 NOTE — Telephone Encounter (Signed)
Informed pt that Dr Juanda Chance states it's ok to start on Ensure and she needs to begin Creon 2 tabs with each meal. She is not to take the CREON with her feedings d/t  the tube placement; she can take them with meals and snacks. Pt stated understanding; she will begin with ENSURE PLUS and foods she can tolerate during the day and use the TF with VITAL AF at night. I left her samples of Creon and she may call for a script later. Pt has a f/u with Dr Juanda Chance on 04/22/12.

## 2012-03-12 NOTE — Telephone Encounter (Signed)
Spoke with Julie and she is aware

## 2012-03-12 NOTE — Telephone Encounter (Signed)
I agree with using Ensure in place of Vita AF., it should not make any difference since her FT is duodenal and she is not having any diarrhea, anyway which would be indicative of possible malabsorption. I agree with Creon, we have samples in the office which she can use right away or [facility] can give her  their generic Pancreatic enzyme supplements. Please call [facility] to find out which brand they have and start her on 2 tabs with each meal,approx 6/day, #180, 3 refills,  Not to be taken with TF's because they are duodenal, but  with oral intake of liquids or solids, snacks etc.

## 2012-03-12 NOTE — Telephone Encounter (Signed)
Pt called reporting the VITAL AF tube feeding is >$700 for 120 cans and she doesn't think this is the total cost. She reports she can get ENSURE for $4 a case with [facility] assistance. Called [facility] and their price is even higher for the Vital AF. Paged the dietician, Levon Hedger, to ask about the VITAL AF and she stated the pt was given that because of her continuous nausea. This TF also has elemental protein and fat that were already broken down and would be easier for the pt to tolerate d/t her pancreatitis. She knows the VITAL AF is expensive and it may be she could be switched to Osmolite 1.2 or Ensure. When I called the pt back to inform her it would be tomorrow before I will have an answer, she stated she was intending on drinking the Ensure, not using it for a TF. Pt states she is trying to eat more and she has trouble with solid foods and meats. She is not having any nausea at this time. Dr Juanda Chance, d/t cost, can pt stop the TF and try Ensure po; she runs out of VITAL AF Saturday and [facility] had to order it? Also, she reports 2 of our docs mentioned supplements like CREON, is that something she needs? Thanks.

## 2012-03-13 ENCOUNTER — Other Ambulatory Visit: Payer: Self-pay | Admitting: Oncology

## 2012-03-16 ENCOUNTER — Telehealth: Payer: Self-pay | Admitting: Physician Assistant

## 2012-03-16 ENCOUNTER — Ambulatory Visit: Payer: Self-pay | Admitting: General Practice

## 2012-03-16 DIAGNOSIS — I82621 Acute embolism and thrombosis of deep veins of right upper extremity: Secondary | ICD-10-CM

## 2012-03-16 DIAGNOSIS — Z7901 Long term (current) use of anticoagulants: Secondary | ICD-10-CM

## 2012-03-17 ENCOUNTER — Other Ambulatory Visit: Payer: Self-pay | Admitting: *Deleted

## 2012-03-17 MED ORDER — WARFARIN SODIUM 10 MG PO TABS: ORAL_TABLET | ORAL | Status: DC

## 2012-03-17 NOTE — Telephone Encounter (Signed)
I asked Cassie Berry about refilling the Coumadin 10 mg tablets for pt.  The pt said she is out . I did remind her of her appt on Fri 03-20-2012 with [facility].  [name] NP did tell me to sent refill of the Coumadin 10 mg , # 100 tablets.  [facility] [place] [facility] pt's request.  Pt informed and reminded to keep her appt at [facility] on Fri 03-20-2012.

## 2012-03-19 ENCOUNTER — Telehealth: Payer: Self-pay | Admitting: Nurse Practitioner

## 2012-03-19 NOTE — Telephone Encounter (Signed)
The pharmacist Lawson Fiscal from [place].  She had a question on the prescription we called in on 8-6.  We said # 100  10 mg , she was to take 1 and 1/2 10 mg tablets. The pharmacist said it was called in for 1 daily.  [name] ACNP got on the phone and told them for the pt to take 1 an 1/2 tab ( 10 mg) making it 15 mg daily.  # 100 tablets (10 mg).  The patient has an appointment with the [facility] Clinic tomorrow 03-20-2012 at 8:15 Am.

## 2012-03-20 ENCOUNTER — Ambulatory Visit (INDEPENDENT_AMBULATORY_CARE_PROVIDER_SITE_OTHER): Payer: 59 | Admitting: *Deleted

## 2012-03-20 DIAGNOSIS — I82629 Acute embolism and thrombosis of deep veins of unspecified upper extremity: Secondary | ICD-10-CM

## 2012-03-20 DIAGNOSIS — Z7901 Long term (current) use of anticoagulants: Secondary | ICD-10-CM

## 2012-03-20 DIAGNOSIS — I82621 Acute embolism and thrombosis of deep veins of right upper extremity: Secondary | ICD-10-CM

## 2012-03-30 ENCOUNTER — Ambulatory Visit (INDEPENDENT_AMBULATORY_CARE_PROVIDER_SITE_OTHER): Payer: Managed Care, Other (non HMO)

## 2012-03-30 DIAGNOSIS — Z7901 Long term (current) use of anticoagulants: Secondary | ICD-10-CM

## 2012-03-30 DIAGNOSIS — I82621 Acute embolism and thrombosis of deep veins of right upper extremity: Secondary | ICD-10-CM

## 2012-03-30 DIAGNOSIS — I82629 Acute embolism and thrombosis of deep veins of unspecified upper extremity: Secondary | ICD-10-CM

## 2012-03-30 LAB — POCT INR: INR: 4.7

## 2012-04-01 ENCOUNTER — Encounter: Payer: Self-pay | Admitting: *Deleted

## 2012-04-06 ENCOUNTER — Telehealth: Payer: Self-pay | Admitting: Internal Medicine

## 2012-04-06 ENCOUNTER — Ambulatory Visit (INDEPENDENT_AMBULATORY_CARE_PROVIDER_SITE_OTHER): Payer: Managed Care, Other (non HMO) | Admitting: *Deleted

## 2012-04-06 DIAGNOSIS — Z7901 Long term (current) use of anticoagulants: Secondary | ICD-10-CM

## 2012-04-06 DIAGNOSIS — I82629 Acute embolism and thrombosis of deep veins of unspecified upper extremity: Secondary | ICD-10-CM

## 2012-04-06 DIAGNOSIS — I82621 Acute embolism and thrombosis of deep veins of right upper extremity: Secondary | ICD-10-CM

## 2012-04-06 LAB — POCT INR: INR: 3.7

## 2012-04-06 NOTE — Telephone Encounter (Signed)
Patient wants to having feeding tube removed because she is eating now. 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 Juanda Chance would have to decide when to remove it. Patient aware Dr. Juanda Chance is out of the office this week. Please, advise.

## 2012-04-09 ENCOUNTER — Telehealth: Payer: Self-pay | Admitting: Internal Medicine

## 2012-04-09 NOTE — Telephone Encounter (Signed)
Have the patient see an extender this week to evaluate the area that sore. As well they can assess whether it is appropriate to have the tube removed. They can discuss further, if needed, with Dr. Juanda Chance when she returns

## 2012-04-09 NOTE — Telephone Encounter (Signed)
Spoke with patient and scheduled her with Mike Gip, PA tomorrow at 2:00 PM.

## 2012-04-09 NOTE — Telephone Encounter (Signed)
Patient calling to report her feeding tube is moving around and causing her stomach to hurt. States it is making her cough and sneeze. She states she is eating and not using the tube anymore. She is asking for it to be removed. Had spoken with patient earlier in the week about removing the tube and explained to her Dr. Juanda Chance is out of office until next Tuesday. She states with it hurting her now she does not think she can wait until then to get it out. (was placed on 02/20/12 while she was in hospital) Please, advise. DOD Jarold Motto

## 2012-04-10 ENCOUNTER — Encounter: Payer: Self-pay | Admitting: Physician Assistant

## 2012-04-10 ENCOUNTER — Ambulatory Visit (INDEPENDENT_AMBULATORY_CARE_PROVIDER_SITE_OTHER): Payer: Managed Care, Other (non HMO) | Admitting: Physician Assistant

## 2012-04-10 VITALS — BP 110/60 | HR 66 | Ht 64.0 in | Wt 161.6 lb

## 2012-04-10 DIAGNOSIS — Z8719 Personal history of other diseases of the digestive system: Secondary | ICD-10-CM

## 2012-04-10 DIAGNOSIS — Z4659 Encounter for fitting and adjustment of other gastrointestinal appliance and device: Secondary | ICD-10-CM

## 2012-04-10 NOTE — Telephone Encounter (Signed)
Patient has an appt today with Mike Gip PA at 2:00 for evaluation

## 2012-04-10 NOTE — Progress Notes (Signed)
Subjective:    Patient ID: Cassie Berry, female    DOB: 06-01-1966, 46 y.o.   MRN: [id number]  HPI Samya is a very nice 46 year old female known to Dr. Lina Sar who is recuperating after a long hospitalization earlier this summer for post-ERCP pancreatitis. She was last seen July 30 and over the past month has continued gradual improvement. She still has an enteral feeding tube in which she would very much like removed at this time. She has not been using the tube for any feedings over the past 2 weeks and hasn't used the tube for anything over the past week. She says she feels like food and pills or getting hung up next to the tube and it is getting more uncomfortable. She says she dislodged the tube a bit earlier this week in the shower but was able to replace it. She says she is eating much better and has actually gained from 153 pounds on July 30 of 161 pounds today. She is eating frequent small feedings and seems to still do best with that. She occasionally has some queasiness but has not had any problems with ongoing nausea no vomiting, no significant abdominal pain and has not taken any pain medication over the past week. She has not had any fever. She says her energy level is still poor but she feels about continuing to improve as well .    Review of Systems  Constitutional: Positive for fatigue.  HENT: Positive for sore throat.   Eyes: Negative.   Respiratory: Negative.   Cardiovascular: Negative.   Gastrointestinal: Positive for nausea and abdominal pain.  Genitourinary: Negative.   Musculoskeletal: Negative.   Skin: Negative.   Neurological: Negative.   Hematological: Negative.   Psychiatric/Behavioral: Negative.    Outpatient Prescriptions Prior to Visit  Medication Sig Dispense Refill  . buPROPion (WELLBUTRIN XL) 300 MG 24 hr tablet Take 300 mg by mouth daily.      . cetirizine (ZYRTEC) 10 MG tablet Take 10 mg by mouth daily as needed. For allergies.      .  cholecalciferol (VITAMIN D) 1000 UNITS tablet Take 1,000 Units by mouth daily.      Marland Kitchen esomeprazole (NEXIUM) 40 MG packet Take 40 mg by mouth daily before breakfast.  24 each  0  . promethazine (PHENERGAN) 25 MG tablet Take 1 tablet (25 mg total) by mouth every 6 (six) hours as needed for nausea.  30 tablet  1  . Pyridoxine HCl (VITAMIN B-6 PO) Take 1 tablet by mouth daily.      . vitamin B-12 (CYANOCOBALAMIN) 1000 MCG tablet Take 1,000 mcg by mouth daily.      Marland Kitchen warfarin (COUMADIN) 10 MG tablet Take 1 tablet daily, [facility]20-2012.  100 tablet  0  . enoxaparin (LOVENOX) 120 MG/0.8ML injection Inject 0.8 mLs (120 mg total) into the skin daily.  10 Syringe  0  . Pancrelipase, Lip-Prot-Amyl, (CREON) 36000 UNITS CPEP Take 2 capsules by mouth 3 (three) times daily with meals.  150 capsule  0  . Nutritional Supplements (FEEDING SUPPLEMENT, VITAL AF 1.2 CAL,) LIQD Place 1,000 mLs into feeding tube continuous.  33000 mL  1   Allergies  Allergen Reactions  . Macrodantin Rash  . Dilaudid (Hydromorphone Hcl)     Hallucinations   . Nitrofurantoin    Patient Active Problem List  Diagnosis  . ABDOMINAL PAIN-RUQ  . ABDOMINAL PAIN, EPIGASTRIC  . Right hip pain  . Greater trochanteric bursitis  .  Leg length inequality  . Acute pancreatitis  . Bilateral pleural effusion  . Fever  . Hyponatremia  . Anemia  . Feeding difficulties  . Nonspecific (abnormal) findings on radiological and other examination of gastrointestinal tract  . Deep vein thrombosis, upper right extremity  . Nausea with vomiting  . Encounter for long-term (current) use of anticoagulants       Objective:   Physical Exam well-developed white female in no acute distress, looks good, pleasant, and in good spirits a pressure 110/60 pulse 66 height 5 foot 4 weight 161. HEENT; nontraumatic normocephalic EOMI, PERRLA, sclera anicteric, enteral feeding tube in right nares. Cardiovascular; regular rate and  rhythm with S1-S2 no murmur or gallop, Pulmonary; clear bilaterally, Abdomen; soft minimally tender in the upper abdomen no guarding no rebound no palpable mass or hepatosplenomegaly bowel sounds are active, Recta;l not done, Extremities; no clubbing cyanosis or edema skin warm and dry, Psych ;mood and affect normal and appropriate.        Assessment & Plan:  #47 46 year old female with severe post ERCP pancreatitis with prolonged hospitalization earlier this summer. 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 She is continuing to improve, and is no longer using her enteral feeding tube. She has gained about 8 pounds over the past month. #2 right upper extremity DVT secondary to PICC line; continues on Lovenox, as she did not metabolize Coumadin #3 persistent fatigue-improving  Plan; enteral feeding tube was removed today in the office Patient will continue small frequent meals as she has been doing and Ensure supplements Stop Creon Continue Lovenox Patient has had labs done through Dr.Pharr's office within the past couple of weeks and will obtain copies of those Keep followup appointment with Dr. Lina Sar in 2 weeks, patient know she should call in the interim should she have any problems.

## 2012-04-10 NOTE — Patient Instructions (Addendum)
Discontinue the Creon. Call us as needed. We will be glad to help you.

## 2012-04-10 NOTE — Telephone Encounter (Signed)
Pt should not remove her feeding tube herself. I will be happy to remove it , will work her in, if she wants to.

## 2012-04-10 NOTE — Progress Notes (Signed)
I agree with the plan outlined in this note.  Glad to know she is improving.

## 2012-04-16 ENCOUNTER — Ambulatory Visit (INDEPENDENT_AMBULATORY_CARE_PROVIDER_SITE_OTHER): Payer: Managed Care, Other (non HMO) | Admitting: *Deleted

## 2012-04-16 DIAGNOSIS — Z7901 Long term (current) use of anticoagulants: Secondary | ICD-10-CM

## 2012-04-16 DIAGNOSIS — I82621 Acute embolism and thrombosis of deep veins of right upper extremity: Secondary | ICD-10-CM

## 2012-04-16 DIAGNOSIS — I82629 Acute embolism and thrombosis of deep veins of unspecified upper extremity: Secondary | ICD-10-CM

## 2012-04-16 LAB — PROTIME-INR
INR: 5.23 (ref <1.50)
Prothrombin Time: 48.4 seconds — ABNORMAL HIGH (ref 11.6–15.2)

## 2012-04-22 ENCOUNTER — Ambulatory Visit (INDEPENDENT_AMBULATORY_CARE_PROVIDER_SITE_OTHER): Payer: Managed Care, Other (non HMO) | Admitting: Internal Medicine

## 2012-04-22 ENCOUNTER — Encounter: Payer: Self-pay | Admitting: Internal Medicine

## 2012-04-22 VITALS — BP 108/78 | HR 76 | Ht 64.0 in | Wt 160.2 lb

## 2012-04-22 DIAGNOSIS — K859 Acute pancreatitis without necrosis or infection, unspecified: Secondary | ICD-10-CM

## 2012-04-22 DIAGNOSIS — K863 Pseudocyst of pancreas: Secondary | ICD-10-CM

## 2012-04-22 MED ORDER — CYCLOBENZAPRINE HCL 10 MG PO TABS: ORAL_TABLET | ORAL | Status: DC

## 2012-04-22 NOTE — Patient Instructions (Addendum)
You have been scheduled for an abdominal ultrasound attn: pancreas-at [facility] (1st floor of hospital) on Friday 04/24/12 at 9:30 am. Please arrive 15 minutes prior to your appointment for registration. Make certain not to have anything to eat or drink 6 hours prior to your appointment. Should you need to reschedule your appointment, please contact radiology at [phone]. Please follow up with Dr Juanda Chance in 2 months. We will schedule an appointment with a dietician and will contact you with that appointment. We have sent the following medications to your pharmacy for you to pick up at your convenience: Flexeril CC: Dr Renne Crigler

## 2012-04-22 NOTE — Progress Notes (Signed)
Cassie Berry 1965-12-23 MRN [id number]   History of Present Illness:  This is a 46 year old white female who is post hospitalization for post-ERCP pancreatitis on 01/23/2012. The ERCP was done because of right upper quadrant abdominal pain and abnormal liver function tests consistent with sphincter of Odi dysfunction. She was hospitalized for approximately 6 weeks and had a feeding tube placed for long-term nutrition. The tube was removed last week. She has been gaining weight from 153 to 160 pounds. Her last CT scan of the abdomen in 02/19/2012 showed resolution of the pseudocyst and showed her to be postcholecystectomy state. There was also resolution of the duodenal stenosis. She has been on Coumadin for deep venous thrombosis in the right arm secondary to PICC line. She is making slow progress in terms of her energy level. She is having cramps in both legs at night and during the day. There has been no abdominal pain, nausea or vomiting. Her bowel habits have been regular.   Past Medical History  Diagnosis Date  . GERD (gastroesophageal reflux disease)   . Anxiety   . Migraine headache   . PONV (postoperative nausea and vomiting)   . Pneumonia     02/2011  . Pancreatitis   . Duodenal stenosis   . DVT (deep venous thrombosis)   . Duodenitis   . Depression    Past Surgical History  Procedure Date  . Appendectomy   . Cholecystectomy   . Ercp 01/23/2012    Procedure: ENDOSCOPIC RETROGRADE CHOLANGIOPANCREATOGRAPHY (ERCP);  Surgeon: Louis Meckel, MD;  Location: [facility];  Service: Endoscopy;  Laterality: N/A;  . Sphincterotomy 01/23/2012    Procedure: SPHINCTEROTOMY;  Surgeon: Louis Meckel, MD;  Location: [facility];  Service: Endoscopy;  Laterality: N/A;  . Esophagogastroduodenoscopy 01/25/2012    Procedure: ESOPHAGOGASTRODUODENOSCOPY (EGD);  Surgeon: Rachael Fee, MD;  Location: [facility];  Service: Endoscopy;  Laterality: N/A;  . Esophagogastroduodenoscopy 02/20/2012      Procedure: ESOPHAGOGASTRODUODENOSCOPY (EGD);  Surgeon: Meryl Dare, MD,FACG;  Location: [facility];  Service: Endoscopy;  Laterality: N/A;  ?feeding tube placement at same time post pyloric tube    reports that she has quit smoking. Her smoking use included Cigarettes. She has never used smokeless tobacco. She reports that she drinks alcohol. She reports that she does not use illicit drugs. family history includes Breast cancer in her maternal aunt; Colon cancer in her cousin and paternal grandfather; Diabetes in her father; Heart disease in her father; Lung cancer in her mother; and Prostate cancer in her paternal grandfather. Allergies  Allergen Reactions  . Macrodantin Rash  . Dilaudid (Hydromorphone Hcl)     Hallucinations   . Nitrofurantoin         Review of Systems:Denies heartburn dysphagia abdominal pain fever or diarrhea  The remainder of the 10 point ROS is negative except as outlined in H&P   Physical Exam: General appearance  Well developed, in no distress. Eyes- non icteric. HEENT nontraumatic, normocephalic. Mouth no lesions, tongue papillated, no cheilosis. Neck supple without adenopathy, thyroid not enlarged, no carotid bruits, no JVD. Lungs Clear to auscultation bilaterally. Cor normal S1, normal S2, regular rhythm, no murmur,  quiet precordium. Abdomen: Soft with normoactive bowel sounds. Mild tenderness in epigastrium and to the left of the midline. No pulsations or mass. Rectal:Not done.  Extremities no pedal edema. Skin no lesions, she has some hair loss. The PICC line site in the right arm is well healed. Neurological alert and oriented x 3. Psychological  normal mood and affect.  Assessment and Plan:  Problem #1 Post ERCP pancreatitis resulting in prolonged hospitalization. Patient is status post ERCP and sphincterotomy for suspected biliary dyskinesia.Her initial abdominal pain is no longer present since the spincterotomy. She is doing well from  that standpoint. There has been no abdominal pain. She is developing more tolerance to foods. She would like to discuss her diet with a dietitian. I suggest a high-protein, carb modified diet. We will repeat her pancreatic ultrasound. I will see her in 2 months.  Problem #2 Anticoagulation with Coumadin for DVT of right arm. She has been followed in the Coumadin clinic.  04/22/2012 Lina Sar

## 2012-04-23 ENCOUNTER — Encounter: Payer: Self-pay | Admitting: Dietician

## 2012-04-23 ENCOUNTER — Encounter: Payer: Managed Care, Other (non HMO) | Attending: Internal Medicine | Admitting: Dietician

## 2012-04-23 ENCOUNTER — Ambulatory Visit (INDEPENDENT_AMBULATORY_CARE_PROVIDER_SITE_OTHER): Payer: Managed Care, Other (non HMO) | Admitting: *Deleted

## 2012-04-23 VITALS — Ht 64.0 in | Wt 160.3 lb

## 2012-04-23 DIAGNOSIS — Z7901 Long term (current) use of anticoagulants: Secondary | ICD-10-CM

## 2012-04-23 DIAGNOSIS — I82629 Acute embolism and thrombosis of deep veins of unspecified upper extremity: Secondary | ICD-10-CM

## 2012-04-23 DIAGNOSIS — K862 Cyst of pancreas: Secondary | ICD-10-CM | POA: Insufficient documentation

## 2012-04-23 DIAGNOSIS — Z713 Dietary counseling and surveillance: Secondary | ICD-10-CM | POA: Insufficient documentation

## 2012-04-23 DIAGNOSIS — K863 Pseudocyst of pancreas: Secondary | ICD-10-CM | POA: Insufficient documentation

## 2012-04-23 DIAGNOSIS — I82621 Acute embolism and thrombosis of deep veins of right upper extremity: Secondary | ICD-10-CM

## 2012-04-23 NOTE — Patient Instructions (Addendum)
   Consider using the lingual B 12 that can be chewed and absorbed in the mouth.  Try to add more protein in your diet in the form of the 93-97% lean beef in the spaghetti sauce.  Use the food label and the handout for counting grams of protein and aim for 140 gm per day.  Consider using the softer cheeses.  Consider using the low fat cottage cheese.  If you dot the bison and venison, make it a small serving.  Remember 1 oz of meat/poultry is 7 gm of protein.  Follow your taste buds.  Consider using 2 eggs, they are 14 gm protein and high in th biotin.  Try slowly the hummas  For protein.

## 2012-04-23 NOTE — Progress Notes (Signed)
Medical Nutrition Therapy:  Appt start time: 1430 end time:  1530.   Assessment:  Primary concerns today.  Comes today to get assistance with her diet.  Notes her MD recommends eating more protein and she is unsure of how to include more protein in her diet.  She is S/P having experienced pancreatitis post -ERCP on 01/23/2012.  She relates that she was hospitalized for 5 weeks following the procedure.  For many days, she did not eat and eventually had a feeding tube which was just removed with the last 2 weeks.Today, she is stressed with the fact that her hair is falling out at this time.  On examination, she has short strands of new hair coming in at the hairline.  She gives a current history of decreased protein intake following a lengthy bout of malnutrition. She willing to make changes to her diet in order to get some energy back.  MEDICATIONS: Medication review completed   DIETARY INTAKE:  Usual eating pattern includes 3 meals and 2-3 snacks per day.  Everyday foods include bland items, low in salt, trying to eat more protein foods.  Avoided foods include Very spicy and fatty foods..    24-hr recall:  B ( AM): 6:30 coffee with cream and sugar , muffin (blue berry) or piece of toast (white bread) or bowl of cereal (wheat chex, rice chex) with whole milk 8 oz. Snk ( AM): 10:30  Soup (potato, chicken and rice, vegetable, all homemade) OR steamers, vegetables, chicken sandwich using baked chick, with a little mayo, salt and pepper, maybe apple sauce or pear, sometimes the [employer] apple salad.  Maybe a protein bar  L ( PM): Protein bar, or couple of eggs,  Snk ( PM): mid kind of snack with foods mentioned above. Cup of milk with a rice crispy treats.  Yogurt, greek   D ( PM): [employer] salad and a smoothie with protein powder  Snk ( PM): small chicken sandwich and protein bar. Beverages: coffee, milk, coke, (4 oz)  Usual physical activity: Currently tires very easily.  At times will have to rest  when just doing some of her ADL"s  Estimated energy needs: Ht:64 in WT: 160.3lb   BMI: 27.6 kg/m2   1800-2000 calories  More like 2100 for tissue repair 225 g carbohydrates 145-150 g protein 35-40 g fat  Progress Towards Goal(s):  In progress.   Nutritional Diagnosis:  NI-5.7.1 Inadequate protein intake As related to recent illness of pancreatic pseudocyst, lenghty time of being NPO and not able to tolerate tube feedings for a time, with an evuntual use of a feeding tube to deal with the malnutrition..  As evidenced by weight loss, total protein level of 6.4, Albumin of 2.6 and pre albumin of 10.4 mg in the most recent past.    Intervention:  Nutrition Currently at home eating small meals and snacks as a rule.  Trying to limit her fat intake with the use of no added fat, frying and fatty meats/proteins.  In fact, she was unsure of the different protein foods.  We reviewed the proteins and the ones that she felt she could eat and looked at ways of incorporating them into her daily diet.  Handouts given during visit include:  Carb counting guide which provides a detailed list of protein foods  List of the foods that are hight in vitamin K with the content per serving  Suggestions for incorporating more protein into her diet without adding fat.  Monitoring/Evaluation:  Dietary intake,  exercise, and body weight in 4-6 weeks.Marland Kitchen

## 2012-04-23 NOTE — Patient Instructions (Signed)
Reviewed vitamin k diet, safety with high INR, anticoagulation safety

## 2012-04-24 ENCOUNTER — Ambulatory Visit (HOSPITAL_COMMUNITY)
Admission: RE | Admit: 2012-04-24 | Discharge: 2012-04-24 | Disposition: A | Discharge status: Home / Self Care | Payer: Managed Care, Other (non HMO) | Source: Ambulatory Visit | Attending: Internal Medicine | Admitting: Internal Medicine

## 2012-04-24 ENCOUNTER — Encounter: Payer: Self-pay | Admitting: Dietician

## 2012-04-24 DIAGNOSIS — Z9089 Acquired absence of other organs: Secondary | ICD-10-CM | POA: Insufficient documentation

## 2012-04-24 DIAGNOSIS — K863 Pseudocyst of pancreas: Secondary | ICD-10-CM

## 2012-04-24 DIAGNOSIS — K8689 Other specified diseases of pancreas: Secondary | ICD-10-CM | POA: Insufficient documentation

## 2012-04-24 DIAGNOSIS — K862 Cyst of pancreas: Secondary | ICD-10-CM | POA: Insufficient documentation

## 2012-05-01 ENCOUNTER — Ambulatory Visit (INDEPENDENT_AMBULATORY_CARE_PROVIDER_SITE_OTHER): Payer: Managed Care, Other (non HMO) | Admitting: *Deleted

## 2012-05-01 DIAGNOSIS — Z7901 Long term (current) use of anticoagulants: Secondary | ICD-10-CM

## 2012-05-01 DIAGNOSIS — I82629 Acute embolism and thrombosis of deep veins of unspecified upper extremity: Secondary | ICD-10-CM

## 2012-05-01 DIAGNOSIS — I82621 Acute embolism and thrombosis of deep veins of right upper extremity: Secondary | ICD-10-CM

## 2012-05-28 ENCOUNTER — Other Ambulatory Visit: Payer: Self-pay | Admitting: Oncology

## 2012-06-25 ENCOUNTER — Inpatient Hospital Stay (HOSPITAL_COMMUNITY)
Admission: AD | Admit: 2012-06-25 | Discharge: 2012-06-27 | DRG: 440 | Disposition: A | Discharge status: Home / Self Care | Payer: Managed Care, Other (non HMO) | Source: Ambulatory Visit | Attending: Internal Medicine | Admitting: Internal Medicine

## 2012-06-25 ENCOUNTER — Inpatient Hospital Stay (HOSPITAL_COMMUNITY): Payer: Managed Care, Other (non HMO)

## 2012-06-25 DIAGNOSIS — F3289 Other specified depressive episodes: Secondary | ICD-10-CM | POA: Diagnosis present

## 2012-06-25 DIAGNOSIS — F411 Generalized anxiety disorder: Secondary | ICD-10-CM | POA: Diagnosis present

## 2012-06-25 DIAGNOSIS — K859 Acute pancreatitis without necrosis or infection, unspecified: Principal | ICD-10-CM

## 2012-06-25 DIAGNOSIS — E876 Hypokalemia: Secondary | ICD-10-CM | POA: Diagnosis present

## 2012-06-25 DIAGNOSIS — Z86718 Personal history of other venous thrombosis and embolism: Secondary | ICD-10-CM

## 2012-06-25 DIAGNOSIS — Z79899 Other long term (current) drug therapy: Secondary | ICD-10-CM

## 2012-06-25 DIAGNOSIS — K219 Gastro-esophageal reflux disease without esophagitis: Secondary | ICD-10-CM | POA: Diagnosis present

## 2012-06-25 DIAGNOSIS — R933 Abnormal findings on diagnostic imaging of other parts of digestive tract: Secondary | ICD-10-CM

## 2012-06-25 DIAGNOSIS — F329 Major depressive disorder, single episode, unspecified: Secondary | ICD-10-CM | POA: Diagnosis present

## 2012-06-25 DIAGNOSIS — Z87891 Personal history of nicotine dependence: Secondary | ICD-10-CM

## 2012-06-25 DIAGNOSIS — Z7901 Long term (current) use of anticoagulants: Secondary | ICD-10-CM

## 2012-06-25 DIAGNOSIS — R1013 Epigastric pain: Secondary | ICD-10-CM

## 2012-06-25 LAB — PROTIME-INR
INR: 1.57 — ABNORMAL HIGH (ref 0.00–1.49)
Prothrombin Time: 18.3 seconds — ABNORMAL HIGH (ref 11.6–15.2)

## 2012-06-25 MED ORDER — PROMETHAZINE HCL 25 MG/ML IJ SOLN: 12.5000 mg | Freq: Four times a day (QID) | INTRAMUSCULAR | Status: DC | PRN

## 2012-06-25 MED ORDER — PANTOPRAZOLE SODIUM 40 MG IV SOLR
40.0000 mg | Freq: Every day | INTRAVENOUS | Status: DC
  Administered 2012-06-25 – 2012-06-27 (×3): 40 mg via INTRAVENOUS
  Filled 2012-06-25 (×3): qty 40

## 2012-06-25 MED ORDER — SODIUM CHLORIDE 0.9 % IV BOLUS (SEPSIS)
500.0000 mL | Freq: Once | INTRAVENOUS | Status: AC
  Administered 2012-06-25: 500 mL via INTRAVENOUS

## 2012-06-25 MED ORDER — IOHEXOL 300 MG/ML  SOLN
100.0000 mL | Freq: Once | INTRAMUSCULAR | Status: AC | PRN
  Administered 2012-06-25: 80 mL via INTRAVENOUS

## 2012-06-25 MED ORDER — WARFARIN SODIUM 10 MG PO TABS
10.0000 mg | ORAL_TABLET | ORAL | Status: DC
  Administered 2012-06-26: 10 mg via ORAL
  Filled 2012-06-25: qty 1

## 2012-06-25 MED ORDER — MORPHINE SULFATE 2 MG/ML IJ SOLN
2.0000 mg | INTRAMUSCULAR | Status: DC | PRN
  Administered 2012-06-25 (×2): 2 mg via INTRAVENOUS
  Filled 2012-06-25 (×2): qty 1

## 2012-06-25 MED ORDER — DEXTROSE-NACL 5-0.45 % IV SOLN
INTRAVENOUS | Status: DC
  Administered 2012-06-25 – 2012-06-26 (×3): via INTRAVENOUS

## 2012-06-25 MED ORDER — ACETAMINOPHEN 325 MG PO TABS
650.0000 mg | ORAL_TABLET | Freq: Four times a day (QID) | ORAL | Status: DC | PRN
  Administered 2012-06-26: 650 mg via ORAL
  Filled 2012-06-25: qty 2

## 2012-06-25 MED ORDER — ACETAMINOPHEN 650 MG RE SUPP: 650.0000 mg | Freq: Four times a day (QID) | RECTAL | Status: DC | PRN

## 2012-06-25 MED ORDER — WARFARIN SODIUM 5 MG PO TABS
5.0000 mg | ORAL_TABLET | ORAL | Status: DC
  Administered 2012-06-25: 5 mg via ORAL
  Filled 2012-06-25 (×2): qty 1

## 2012-06-25 MED ORDER — BUPROPION HCL ER (XL) 300 MG PO TB24
300.0000 mg | ORAL_TABLET | Freq: Every day | ORAL | Status: DC
  Administered 2012-06-26 – 2012-06-27 (×2): 300 mg via ORAL
  Filled 2012-06-25 (×2): qty 1

## 2012-06-25 MED ORDER — WARFARIN - PHYSICIAN DOSING INPATIENT: Freq: Every day | Status: DC

## 2012-06-25 NOTE — H&P (Signed)
Primary Care Physician:  Londell Moh, MD Primary Gastroenterologist:  Dr. Lina Sar  CHIEF COMPLAINT:  Acute epigastric pain  HPI: Cassie Berry is a 46 y.o. 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 She had sphincterotomy at the time of ERCP, and developed abdominal pain post procedure and was admitted. Unfortunately she had a prolonged hospital course with severe pancreatitis and Sirs. She was hospitalized for over a month and had complication of a right upper extremity DVT which required anticoagulation. She remains on Coumadin. 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 She was fed via and enteral feeding tube, and was discharged home on enteral feedings.  Since discharge in July 2013 she has done very well. Her abdominal pain had completely resolved when she was seen in the office in September, and she was also eating without any difficulty and her feeding tube was removed. She was last seen in the office mid September by Dr. Juanda Chance, again doing well eating without any difficulty. She underwent upper abdominal ultrasound in September which showed gallbladder to be absent, a normal-sized common bile duct with no evidence of filling defects, and no residual pancreatic or Perry pancreatic pseudocysts. The pancreatic duct measured 3.1 mm. She says she's been doing well over the past couple of months and has had no abnormal pain, nausea etc. She had the abrupt onset this morning after eating breakfast of epigastric pain which radiated through to her back. This persisted through the day without nausea vomiting fever chills or diarrhea. She was seen by Dr. Renne Crigler, her primary care physician this afternoon, and  labs were done. CBC and CMET, were unremarkable, amylase reported as greater than 2000. She states the abdominal pain is reminiscent of her pancreatitis, but honestly does not remember much of her pain. She is upset emotionally that she may be having a recurrent episode.   Past Medical History:   GERD (gastroesophageal reflux disease)                       Anxiety                                                      Migraine headache                                            PONV (postoperative nausea and vomiting)                     Pneumonia                                                      Comment:02/2011   Pancreatitis  Duodenal stenosis                                            DVT (deep venous thrombosis)                                 Duodenitis                                                   Depression                                                  Past Surgical History:   APPENDECTOMY                                                 CHOLECYSTECTOMY                                              ERCP                                            01/23/2012      Comment:Procedure: ENDOSCOPIC RETROGRADE               CHOLANGIOPANCREATOGRAPHY (ERCP);  Surgeon:               Louis Meckel, MD;  Location: [facility];                Service: Endoscopy;  Laterality: N/A;   SPHINCTEROTOMY                                  01/23/2012      Comment:Procedure: SPHINCTEROTOMY;  Surgeon: Louis Meckel, MD;  Location: [facility];  Service:               Endoscopy;  Laterality: N/A;   ESOPHAGOGASTRODUODENOSCOPY                      01/25/2012      Comment:Procedure: ESOPHAGOGASTRODUODENOSCOPY (EGD);                Surgeon: Rachael Fee, MD;  Location: [facility]               [facility];  Service: Endoscopy;  Laterality:               N/A;   ESOPHAGOGASTRODUODENOSCOPY                      02/20/2012  Comment:Procedure:  ESOPHAGOGASTRODUODENOSCOPY (EGD);                Surgeon: Meryl Dare, MD,FACG;  Location:               [facility];  Service: Endoscopy;  Laterality:              N/A;  ?feeding tube placement at same               timepost pyloric tube  Prior to Admission medications : Medication buPROPion (WELLBUTRIN XL) 300 MG 24 hr tablet, Sig Take 300 mg by mouth daily., Start Date , End Date , Taking? Yes, Authorizing Provider Historical Provider, MD  Medication cetirizine (ZYRTEC) 10 MG tablet, Sig Take 10 mg by mouth daily as needed. For allergies., Start Date , End Date , Taking? Yes, Authorizing Provider Historical Provider, MD  Medication cholecalciferol (VITAMIN D) 1000 UNITS tablet, Sig Take 1,000 Units by mouth daily., Start Date , End Date , Taking? Yes, Authorizing Provider Historical Provider, MD  Medication cyclobenzaprine (FLEXERIL) 10 MG tablet, Sig Take 10 mg by mouth 3 (three) times daily as needed. For pain., Start Date , End Date , Taking? Yes, Authorizing Provider Historical Provider, MD  Medication promethazine (PHENERGAN) 25 MG tablet, Sig Take 1 tablet (25 mg total) by mouth every 6 (six) hours as needed for nausea., Start Date 03/03/12, End Date , Taking? Yes, Authorizing Provider Meredith Pel, NP  Medication warfarin (COUMADIN) 10 MG tablet, Sig Take 5-10 mg by mouth daily. 1 tab on MWF; 0.5 tab on other days., Start Date , End Date , Taking? Yes, Authorizing Provider Historical Provider, MD    Current Facility-Administered Medications: acetaminophen (TYLENOL) tablet 650 mg, 650 mg, Oral, Q6H PRN, Amy S Esterwood, PA   Or acetaminophen (TYLENOL) suppository 650 mg, 650 mg, Rectal, Q6H PRN, Amy S Esterwood, PA buPROPion (WELLBUTRIN XL) 24 hr tablet 300 mg, 300 mg, Oral, Daily, Amy S Esterwood, PA dextrose 5 %-0.45 % sodium chloride infusion, , Intravenous, Continuous, Amy S Esterwood, PA morphine 2 MG/ML injection 2 mg, 2 mg, Intravenous, Q2H PRN, Amy S Esterwood,  PA promethazine (PHENERGAN) injection 12.5-25 mg, 12.5-25 mg, Intravenous, Q6H PRN, Amy S Esterwood, PA sodium chloride 0.9 % bolus 500 mL, 500 mL, Intravenous, Once, Amy S Esterwood, PA warfarin (COUMADIN) tablet 5 mg, 5 mg, Oral, q1800, Amy S Esterwood, PA    Allergies as of 06/25/2012 - Review Complete 06/25/2012  -- Macrodantin -- Rash -- noted 11/28/2010  -- Dilaudid (hydromorphone hcl) --  -- noted 04/10/2012  -- Nitrofurantoin --  -- noted    Review of patient's family history indicates:   Colon cancer                   Cousin                     Comment: Paternal side   Colon cancer                   Paternal Grandfather     Lung cancer                    Mother                   Diabetes                       Father  Heart disease                  Father                   Prostate cancer                Paternal Grandfather     Breast cancer                  Maternal Aunt            Social History   Marital Status: Married             Spouse Name:                      Years of Education:                 Number of children: 2           Occupational History   None on file  Social History Main Topics   Smoking Status: Former Smoker                   Packs/Day:       Years:           Types: Cigarettes   Smokeless Status: Never Used                       Alcohol Use: Yes               Comment: Occasional   Drug Use: No             Sexual Activity: Not on file        Other Topics            Concern   None on file  Social History Narrative   Daily caffeine    Review of Systems: Pertinent positive and negative review of systems were noted in the above HPI section.  All other review of systems was otherwise negative.  Physical Exam: Vital signs in last 24 hours: Temp:  [98.3 F (36.8 C)] 98.3 F (36.8 C) (11/14 1747) Pulse Rate:  [72] 72  (11/14 1747) Resp:  [18] 18  (11/14 1747) BP: (122)/(79) 122/79 mmHg (11/14 1747) SpO2:  [100 %] 100 %  (11/14 1747) Weight:  [171 lb (77.565 kg)] 171 lb (77.565 kg) (11/14 1747)   General:   Alert,  well-developed, cooperative tearful healthy appearing 46 year old white female  Head:  Normocephalic and atraumatic. Eyes:  Sclera clear, no icterus.   Conjunctiva pink. Ears:  Normal auditory acuity. Mouth:  No deformity or lesions.  Oropharynx pink & moist. Neck:  Supple; no masses or thyromegaly. Lungs:  Clear throughout to auscultation.   No wheezes, crackles, or rhonchi. No acute distress. Heart:  Regular rate and rhythm; no murmurs. Abdomen:  Soft, tender across the upper abdomen am particularly in the epigastrium,, there is no guarding or rebound no palpable mass or hepatosplenomegaly bowel sounds are present but quiet  .    Rectal: Not done   Msk:  Symmetrical without gross deformities. Normal posture. Pulses:  Normal pulses noted. Extremities:  Without edema. Neurologic:  Alert and  oriented x4;  grossly normal neurologically. Skin:  Intact without significant lesions or rashes. Cervical Nodes:  No significant cervical adenopathy. Psych:  Alert and cooperative. Normal mood and affect.  Lab Results: Labs done earlier today outpatient per Dr. Renne Crigler  BUN 16 creatinine 0.7 sodium 142 potassium 4.3, liver function studies normal , WBC 8.1 hemoglobin 12.8 hematocrit of 38.2 platelets 282. I do not have a copy of labs which have her amylase and lipase on him, we were told her amylase was greater than 2000    Studies/Results: No results found.  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  Patient now presents with acute abrupt onset of epigastric pain earlier today and amylase greater than 2000 , presumably secondary to recurrent acute  pancreatitis. She is currently hemodynamically stable, afebrile, and without any evidence  of SIRS.  #2 anticoagulation with Coumadin for history of right upper extremity DVT June 2013   Plan ; Patient is admitted to the GI service , will give a fluid bolus and then Liberal volume replacement over the next 24-48 hours   Morphine for pain control and she is intolerant to Dilaudid Anti-emetics as needed   IV PPI   N.p.o. except ice chips for now Will obtain CT scan of the abdomen and pelvis, and followup labs in a.m. Further plans and workup dependent on her course over the next few days      LOS: 0 days   Amy Esterwood  06/25/2012, 5:53 PM   Chart was reviewed and patient was examined. X-rays were reviewed.   Pt has symptoms c/w acute pancreatitis.  Fortunately she does not appear very ill at this point.  Primary question is etiology for pancreatitis.  ??pancreatic duct stricture.  There was mild pancreatic duct dilitation on ultrasound Sept, 2013.  Plan initial assessment with CT.  If no obvious abnormalities are seen, she probably will need an MRCP as an outpatient.  Immediate plan is for vigorous IV hydration and pain control.   Barbette Hair. Arlyce Dice, M.D., [facility] Cell [phone]

## 2012-06-26 ENCOUNTER — Encounter (HOSPITAL_COMMUNITY): Payer: Self-pay | Admitting: *Deleted

## 2012-06-26 DIAGNOSIS — R933 Abnormal findings on diagnostic imaging of other parts of digestive tract: Secondary | ICD-10-CM

## 2012-06-26 DIAGNOSIS — R1013 Epigastric pain: Secondary | ICD-10-CM

## 2012-06-26 LAB — COMPREHENSIVE METABOLIC PANEL
AST: 16 U/L (ref 0–37)
Albumin: 3.2 g/dL — ABNORMAL LOW (ref 3.5–5.2)
Alkaline Phosphatase: 74 U/L (ref 39–117)
CO2: 25 mEq/L (ref 19–32)
Chloride: 105 mEq/L (ref 96–112)
Creatinine, Ser: 0.53 mg/dL (ref 0.50–1.10)
GFR calc non Af Amer: >90 mL/min (ref >90)
Potassium: 3.4 mEq/L — ABNORMAL LOW (ref 3.5–5.1)
Total Bilirubin: 0.3 mg/dL (ref 0.3–1.2)

## 2012-06-26 LAB — CBC
MCH: 29 pg (ref 26.0–34.0)
MCHC: 34.1 g/dL (ref 30.0–36.0)
MCV: 85.1 fL (ref 78.0–100.0)
Platelets: 271 10*3/uL (ref 150–400)
RDW: 13.1 % (ref 11.5–15.5)

## 2012-06-26 LAB — PROTIME-INR: Prothrombin Time: 19.9 seconds — ABNORMAL HIGH (ref 11.6–15.2)

## 2012-06-26 MED ORDER — KCL IN DEXTROSE-NACL 20-5-0.45 MEQ/L-%-% IV SOLN
INTRAVENOUS | Status: DC
  Administered 2012-06-26 – 2012-06-27 (×4): via INTRAVENOUS
  Filled 2012-06-26 (×6): qty 1000

## 2012-06-26 NOTE — Progress Notes (Signed)
Patient ID: Cassie Berry, female   DOB: June 10, 1966, 46 y.o.   MRN: [id number] [facility] Progress Note  Subjective:  Feels much better. No nausea, and denying pain this am-last pain medicine was 9 pm last night. She wants to go home... And wants to try to eat.   Ct scan-  Recurrent acute pancreatitis involving pancreatic head and uncinate process with peripancreatic  infammatory changes- no ductal dialtion, no pseudocyst.  Objective:  Vital signs in last 24 hours: Temp:  [98.3 F (36.8 C)-98.4 F (36.9 C)] 98.4 F (36.9 C) (11/15 0617) Pulse Rate:  [72-84] 84  (11/15 0617) Resp:  [16-18] 16  (11/15 0617) BP: (100-122)/(56-79) 100/56 mmHg (11/15 0617) SpO2:  [96 %-100 %] 98 % (11/15 0617) Weight:  [171 lb (77.565 kg)] 171 lb (77.565 kg) (11/14 1747) Last BM Date: 06/25/12 General:   Alert,  Well-developed,    in NAD,in better spirits Heart:  Regular rate and rhythm; no murmurs Pulm;clear Abdomen:  Soft,basically nontenderand nondistended. Normal bowel sounds, without guarding, and without rebound.   Extremities:  Without edema. Neurologic:  Alert and  oriented x4;  grossly normal neurologically. Psych:  Alert and cooperative. Normal mood and affect.  Intake/Output from previous day: 11/14 0701 - 11/15 0700 In: 2936.3 [I.V.:2936.3] Out: 2350 [Urine:2350] Intake/Output this shift:    Lab Results:  Lipase 1164, amylase 540  Basename 06/26/12 0415  WBC 5.4  HGB 11.5*  HCT 33.7*  PLT 271   BMET  Basename 06/26/12 0415  NA 138  K 3.4*  CL 105  CO2 25  GLUCOSE 98  BUN 9  CREATININE 0.53  CALCIUM 8.4   LFT  Basename 06/26/12 0415  PROT 5.9*  ALBUMIN 3.2*  AST 16  ALT 12  ALKPHOS 74  BILITOT 0.3  BILIDIR --  IBILI --   PT/INR  Basename 06/26/12 0415 06/25/12 1829  LABPROT 19.9* 18.3*  INR 1.76* 1.57*      Assessment / Plan: #1  46 yo female  With recurrent acute pancreatitis- appears mild and clinically  having minimal pain.    Etiology unclear- would be concerned about pancreatic duct stricture given severe episode in 01/2012.  Will start clear liquids, probably need to observe at least another 24 hours Eventual MRCP  outpt.  #2 mild hypokalemia-will correct #3 anticoagulation - Coumadin #4 Hx of RUE DVT 6 /13 Active Problems:  * No active hospital problems. *      LOS: 1 day   Amy Esterwood  06/26/2012, 9:39 AM    GI ATTENDING  Patient seen and examined. Repeat above. Hopefully will recover rapidly.  Wilhemina Bonito. Eda Keys., M.D. [facility] Division of Gastroenterology

## 2012-06-27 LAB — CBC
HCT: 34.5 % — ABNORMAL LOW (ref 36.0–46.0)
Hemoglobin: 12.1 g/dL (ref 12.0–15.0)
MCH: 29.7 pg (ref 26.0–34.0)
MCHC: 35.1 g/dL (ref 30.0–36.0)
MCV: 84.6 fL (ref 78.0–100.0)
RBC: 4.08 MIL/uL (ref 3.87–5.11)

## 2012-06-27 LAB — BASIC METABOLIC PANEL
BUN: 4 mg/dL — ABNORMAL LOW (ref 6–23)
CO2: 25 mEq/L (ref 19–32)
Calcium: 8.8 mg/dL (ref 8.4–10.5)
Creatinine, Ser: 0.5 mg/dL (ref 0.50–1.10)
Glucose, Bld: 107 mg/dL — ABNORMAL HIGH (ref 70–99)
Sodium: 140 mEq/L (ref 135–145)

## 2012-06-27 NOTE — Progress Notes (Signed)
Patient ID: Cassie Berry, female   DOB: 1965/12/11, 46 y.o.   MRN: [id number] [facility] Progress Note  Subjective: Doing well- wants to go home. Tolerating full liquid diet without difficulty, no nausea or vomiting.  Objective:  Vital signs in last 24 hours: Temp:  [97.5 F (36.4 C)-98.3 F (36.8 C)] 98.3 F (36.8 C) (11/16 0525) Pulse Rate:  [70-81] 73  (11/16 0525) Resp:  [16-18] 18  (11/16 0525) BP: (118-122)/(65-70) 122/65 mmHg (11/16 0525) SpO2:  [97 %-100 %] 97 % (11/16 0525) Last BM Date: 06/25/12 General:   Alert,  Well-developed,    in NAD Heart:  Regular rate and rhythm; no murmurs Pulm;clear Abdomen:  Soft,minimally tender epigastrium  and nondistended. Normal bowel sounds, without guarding, and without rebound.   Extremities:  Without edema. Neurologic:  Alert and  oriented x4;  grossly normal neurologically. Psych:  Alert and cooperative. Normal mood and affect.  Intake/Output from previous day: 11/15 0701 - 11/16 0700 In: 4357 [P.O.:840; I.V.:3517] Out: 5400 [Urine:5400] Intake/Output this shift: Total I/O In: 240 [P.O.:240] Out: -   Lab Results: lipase 1046  Basename 06/27/12 0620 06/26/12 0415  WBC 4.1 5.4  HGB 12.1 11.5*  HCT 34.5* 33.7*  PLT 289 271   BMET  Basename 06/27/12 0620 06/26/12 0415  NA 140 138  K 4.1 3.4*  CL 108 105  CO2 25 25  GLUCOSE 107* 98  BUN 4* 9  CREATININE 0.50 0.53  CALCIUM 8.8 8.4   LFT  Basename 06/26/12 0415  PROT 5.9*  ALBUMIN 3.2*  AST 16  ALT 12  ALKPHOS 74  BILITOT 0.3  BILIDIR --  IBILI --   PT/INR  Basename 06/26/12 0415 06/25/12 1829  LABPROT 19.9* 18.3*  INR 1.76* 1.57*   Assessment / Plan:  #1  46 yo female with hx of severe post ERCP pancreatitis, now with mild recurrent episode with rapid resolution of sxs.  #2  Chronic anticoagulation- on Coumadin  Home today Low fat full liquid diet Will follow up in office in one week, and plan MRCP at that time. Active Problems:  * No active hospital problems. *      LOS: 2 days   Amy Esterwood  06/27/2012, 10:25 AM    GI ATTENDING  Agree with above  Wilhemina Bonito. Eda Keys., M.D. [facility] Division of Gastroenterology

## 2012-06-27 NOTE — Progress Notes (Signed)
Pt for discharge home today, IV D/C, D/C instructions with verbalized understanding.  Staff to assist pt with discharge. Staff brought pt downstairs via wheelchair.

## 2012-06-29 ENCOUNTER — Telehealth: Payer: Self-pay | Admitting: *Deleted

## 2012-06-29 DIAGNOSIS — K859 Acute pancreatitis without necrosis or infection, unspecified: Secondary | ICD-10-CM

## 2012-06-29 NOTE — Telephone Encounter (Signed)
Patient given appointments with instructions as per Mike Gip, PA. Patient understands to call us for any change in condition.

## 2012-06-29 NOTE — Telephone Encounter (Signed)
Per Mike Gip, PA patient needs MRCP next week and OV with Dr. Juanda Chance. Scheduled MRCP on 07/06/12 12:45/1:00 PM at [facility] radiology. NPO 4 hours prior(9AM).  Scheduled with Dr. Juanda Chance on 07/17/12 at 2:30 PM. Left a message for patient to call me.

## 2012-07-01 ENCOUNTER — Telehealth: Payer: Self-pay | Admitting: Internal Medicine

## 2012-07-01 DIAGNOSIS — K859 Acute pancreatitis without necrosis or infection, unspecified: Secondary | ICD-10-CM

## 2012-07-01 MED ORDER — MORPHINE SULFATE 15 MG PO TABS: ORAL_TABLET | ORAL | Status: DC

## 2012-07-01 NOTE — Telephone Encounter (Signed)
Spoke with patient and gave her Mike Gip, [place] recommendations. She will back off to full liquids, push po's. She will have the rx picked up today. Left rx up front for pick up. Scheduled patient with Mike Gip, PA on 07/03/12 ay 11:00 AM.  She states she will come early on Friday for labs. She understands to call if condition worsens. Will check on her tomorrow.

## 2012-07-01 NOTE — Telephone Encounter (Signed)
Patient calling to report she is having pain in her stomach again. She increased her diet on Monday. She ate 1/2 sandwich with meat and beef vegetable soup and the pain started back. Yesterday, she ate light again and took Tylenol without relief. She reports Vicodin and Percocet cause vomiting. She states she has taken Morphine without any problems. She does have someone that could pick up an rx. Please, advise.

## 2012-07-01 NOTE — Telephone Encounter (Signed)
Ok, ask her to back off to full liquids, push fluids. She can have morphine 15 mg , take half a tablet every 4 h-6 hours as needed.#30/0 refills We need to check on her tomorrow, and have her come in on Friday late am like 11 slot-,and she should have labs done before appt. CBC,CMET,Lipase.    If she is the one coming for the RX today then have her do the labs today

## 2012-07-02 NOTE — Telephone Encounter (Signed)
Spoke with patient and she is doing better since backing off to full liquids and taking the pain medication. She will keep appointment tomorrow.

## 2012-07-02 NOTE — Discharge Summary (Signed)
[facility] Discharge Summary  Name: Cassie Berry MRN: [id number] DOB: 1965-09-19 46 y.o. PCP:  Londell Moh, MD  Date of Admission: 06/25/2012  5:01 PM Date of Discharge: 07/02/2012 Attending Physician: Dr. 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 Sudden onset of abdominal pain today.  CT ABDOMEN AND PELVIS WITH CONTRAST  Technique:  Multidetector CT imaging of the abdomen and pelvis was performed following the standard protocol during bolus administration of intravenous contrast.  Contrast: 80mL OMNIPAQUE IOHEXOL 300 MG/ML  SOLN  Comparison: CT scan dated 02/19/2012  Findings: The patient has developed recurrent pancreatitis with inflammation around the head and uncinate process and around the second and third portions of the duodenum with adjacent soft tissue stranding.  There is no dilatation of the pancreatic duct or common bile duct.  The body and tail of the pancreas are normal.  No pseudocyst.  The liver, spleen, adrenal glands, and kidneys are normal.  Bowel is normal.  Uterus and ovaries are normal.  No osseous abnormality.  IMPRESSION: Recurrent acute pancreatitis involving the head and uncinate process with peripancreatic inflammation.   Original Report Authenticated By: Francene Boyers, M.D.     GI Procedures: none  History/Physical Exam:  See Admission H&P  Admission HPI: Cassie Berry is a very nice 46 year old white female known to Dr. Lina Sar with history consistent with sphincter of OD dysfunction. She underwent ERCP on 01/23/2012 with a sphincterotomy and pancreatic duct stent placement. Unfortunately she developed post ERCP pancreatitis which was severe and she had a prolonged  hospital stay due to severe pancreatitis, complicated by Sirs, pancreatic pseudocysts and secondary duodenal stenosis with inability to tolerate by mouth for several weeks. She was nutritionally supported with an enteral feeding tube. Course was also complicated by a right upper extremity DVT which she is now on Coumadin. She had been doing well over the past few months, had her feeding tube removed in September and has been eating without difficulty in the interim. She had not been having any ongoing abdominal pain, discomfort, nausea etc. On the day of admission, she developed epigastric pain which did not radiate to her back or her chest but persisted throughout the day. Her pain was not associated with nausea vomiting fever chills or diarrhea or She had no complaint of shortness of breath. She was seen by her primary care physician, Dr. Renne Crigler, and labs were done and were consistent with recurrent acute pancreatitis. Our office was contacted, and arrangements were made for admission to the GI service for supportive medical management.  Hospital Course by problem list: Patient was admitted on the evening of 06/25/2012 with recurrent acute epigastric pain, onset early that morning and persisted throughout the day. This was associated with mild nausea but no vomiting, no fever or chills, no diarrhea She was initially seen by her primary care, Dr. Renne Crigler and labs were done through his office revealing a significantly elevated amylase of greater than 2000. 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 She was initially placed at bowel rest vigorously volume repleted and given analgesics and antiemetics. She underwent CT scan of the abdomen and pelvis on the evening of admission with results as outlined above consistent with recurrent acute pancreatitis  involving the head and uncinate process with peripancreatic inflammation. Labs  done the following morning showed a lipase of 1164 amylase of 540 WBC 5.4 hemoglobin 11.5 and had hematocrit of 33.7 potassium was 3.4 BUN 9 creatinine 0.53 liver function studies were entirely normal. She had very rapid resolution of her pain on and only required a couple of doses of pain medication during her hospitalization. And her nausea resolved as well and she was started on a clear liquid diet with advancement full liquid diet without difficulty. She did not have any clinical signs of SIRS. By the morning of 11/16 she was asking to go home, and felt that she could manage without any difficulty. Again she denied any abdominal pain or nausea, had been taking by mouth doubt change in her symptoms and had been ambulating without any difficulty . Lipase on the morning of 11/16 was 1047. She was allowed discharged home with plans to followup in the office within the next 2 weeks. She is also to be scheduled for an MRCP in a week to allow her pancreas to cool down a bit.  Discharge Vitals:  BP 122/65  Pulse 73  Temp 98.3 F (36.8 C) (Oral)  Resp 18  Ht 5\' 4"  (1.626 m)  Wt 171 lb (77.565 kg)  BMI 29.35 kg/m2  SpO2 97%    Disposition and follow-up:   Cassie Berry was discharged from [facility] in stable condition.    Follow-up Appointments: Discharge Orders    Future Appointments: Provider: Department: Dept Phone: Center:   07/03/2012 11:00 AM Amy Oswald Hillock, PA [facility] [phone] [facility]   07/06/2012  1:00 PM [facility] [phone] [place]   07/17/2012 2:30 PM Hart Carwin, MD  [facility] [phone] [facility]      Discharge Medications:   Medication List     As of 07/02/2012  9:24 AM    TAKE these medications         buPROPion 300 MG 24 hr tablet   Commonly known as: WELLBUTRIN XL   Take 300 mg by mouth daily.      cetirizine 10 MG tablet   Commonly known as:  ZYRTEC   Take 10 mg by mouth daily as needed. For allergies.      cholecalciferol 1000 UNITS tablet   Commonly known as: VITAMIN D   Take 1,000 Units by mouth daily.      cyclobenzaprine 10 MG tablet   Commonly known as: FLEXERIL   Take 10 mg by mouth 3 (three) times daily as needed. For pain.      promethazine 25 MG tablet   Commonly known as: PHENERGAN   Take 1 tablet (25 mg total) by mouth every 6 (six) hours as needed for nausea.      warfarin 10 MG tablet   Commonly known as: COUMADIN   Take 5-10 mg by mouth daily. 1 tab on MWF; 0.5 tab on other days.        Signed: Mike Gip 07/02/2012, 9:24 AM

## 2012-07-03 ENCOUNTER — Encounter: Payer: Self-pay | Admitting: Physician Assistant

## 2012-07-03 ENCOUNTER — Ambulatory Visit (INDEPENDENT_AMBULATORY_CARE_PROVIDER_SITE_OTHER): Payer: Managed Care, Other (non HMO) | Admitting: Physician Assistant

## 2012-07-03 ENCOUNTER — Other Ambulatory Visit (INDEPENDENT_AMBULATORY_CARE_PROVIDER_SITE_OTHER): Payer: Managed Care, Other (non HMO)

## 2012-07-03 VITALS — BP 100/60 | HR 78 | Ht 64.0 in | Wt 170.4 lb

## 2012-07-03 DIAGNOSIS — K859 Acute pancreatitis without necrosis or infection, unspecified: Secondary | ICD-10-CM

## 2012-07-03 LAB — CBC WITH DIFFERENTIAL/PLATELET
Basophils Absolute: 0 10*3/uL (ref 0.0–0.1)
Basophils Relative: 0.4 % (ref 0.0–3.0)
Eosinophils Absolute: 0.1 10*3/uL (ref 0.0–0.7)
MCHC: 33.7 g/dL (ref 30.0–36.0)
MCV: 87.8 fl (ref 78.0–100.0)
Monocytes Absolute: 0.4 10*3/uL (ref 0.1–1.0)
Neutro Abs: 2.9 10*3/uL (ref 1.4–7.7)
Neutrophils Relative %: 57.6 % (ref 43.0–77.0)
RBC: 4.46 Mil/uL (ref 3.87–5.11)
RDW: 13.5 % (ref 11.5–14.6)

## 2012-07-03 LAB — COMPREHENSIVE METABOLIC PANEL
ALT: 17 U/L (ref 0–35)
AST: 21 U/L (ref 0–37)
Alkaline Phosphatase: 90 U/L (ref 39–117)
BUN: 12 mg/dL (ref 6–23)
Creatinine, Ser: 0.6 mg/dL (ref 0.4–1.2)
Potassium: 4.2 mEq/L (ref 3.5–5.1)

## 2012-07-03 MED ORDER — PROMETHAZINE HCL 25 MG PO TABS: 25.0000 mg | ORAL_TABLET | Freq: Four times a day (QID) | ORAL | Status: DC | PRN

## 2012-07-03 NOTE — Progress Notes (Signed)
Subjective:    Patient ID: Cassie Berry, female    DOB: 10/31/1965, 46 y.o.   MRN: [id number]  HPI  Cassie Berry is a very nice 46 year old white female, known to Dr. Lina Sar, who had a prolonged hospitalization in June of 2013 with severe post ERCP pancreatitis. The course was complicated by a right upper extremity DVT, for which she is on Coumadin. 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 Her feeding tube was removed in September of 2013 and she had been doing very well with no complaints of nausea or abdominal pain. She developed recurrent acute upper abdominal pain and nausea on 06/25/2012 which was not severe but persisted. She was readmitted to [facility] long, after outpatient labs were consistent with pancreatitis. She had CT scan of the abdomen and pelvis on 06/25/2012 which showed recurrent acute pancreatitis involving the head and uncinate process with peripancreatic inflammation, no pseudocysts and no dilation of the pancreatic duct or common bile duct . Liver function studies were normal, lipase in the 1000 range. She had very quick resolution of her symptoms and within 24 hours was feeling much better. We were able to advance her diet without difficulty and she was allowed discharged home on 06/27/2012 She says she did well for the first couple of days and really did not have any abdominal pain, and had stayed on full liquids. Earlier this week she had developed some upper abdominal discomfort which was persisting and had called for pain medication. She is  intolerant to codeine, and was given morphine 7.5 mg to use on an as-needed basis . She comes back in today for followup, and states that the morphine has helped and she has only had to use 3 doses over the past couple of days. She is still having some mild upper of Donald discomfort and intermittent queasiness but no significant nausea or  vomiting. She has not had any fever or chills. She is tolerating full liquids without difficulty.  Labs were done earlier today and are pending, she had previously been scheduled for an MRCP which is set up for next week    Review of Systems  Constitutional: Positive for appetite change and fatigue.  HENT: Negative.   Eyes: Negative.   Respiratory: Negative.   Cardiovascular: Negative.   Gastrointestinal: Positive for nausea and abdominal pain.  Genitourinary: Negative.   Musculoskeletal: Negative.   Neurological: Negative.   Hematological: Negative.   Psychiatric/Behavioral: Negative.    Outpatient Prescriptions Prior to Visit  Medication Sig Dispense Refill  . buPROPion (WELLBUTRIN XL) 300 MG 24 hr tablet Take 300 mg by mouth daily.      . cetirizine (ZYRTEC) 10 MG tablet Take 10 mg by mouth daily as needed. For allergies.      . cholecalciferol (VITAMIN D) 1000 UNITS tablet Take 1,000 Units by mouth daily.      . cyclobenzaprine (FLEXERIL) 10 MG tablet Take 10 mg by mouth 3 (three) times daily as needed. For pain.      Marland Kitchen morphine (MSIR) 15 MG tablet Take 1/2 tablet every 4-6 hours prn pain  30 tablet  0  . warfarin (COUMADIN) 10 MG tablet Take 5-10 mg by mouth daily. 1 tab on MWF; 0.5 tab on other days.      . promethazine (PHENERGAN) 25 MG tablet Take 1 tablet (25 mg total) by mouth every 6 (six) hours as needed for nausea.  30  tablet  1   Allergies  Allergen Reactions  . Macrodantin Rash  . Dilaudid (Hydromorphone Hcl)     Hallucinations   . Nitrofurantoin    Patient Active Problem List  Diagnosis  . ABDOMINAL PAIN-RUQ  . ABDOMINAL PAIN, EPIGASTRIC  . Right hip pain  . Greater trochanteric bursitis  . Leg length inequality  . Acute pancreatitis  . Bilateral pleural effusion  . Fever  . Hyponatremia  . Anemia  . Feeding difficulties  . Nonspecific (abnormal) findings on radiological and other examination of gastrointestinal tract  . Deep vein thrombosis, upper  right extremity  . Nausea with vomiting  . Encounter for long-term (current) use of anticoagulants   History  Substance Use Topics  . Smoking status: Former Smoker    Types: Cigarettes  . Smokeless tobacco: Never Used  . Alcohol Use: No     Comment: Occasional      Objective:   Physical Exam well-developed white female in no acute distress, pleasant blood pressure 100/60 pulse 78 height 5 foot 4 weight 170. HEENT; nontraumatic normocephalic EOMI PERRLA sclera anicteric,Neck; Supple no JVD, Cardiovascular; regular rate and rhythm with S1-S2 no murmur or gallop, Pulmonary; clear bilaterally, Abdomen; soft, bowel sounds are present, she is mildly tender across the upper abdomen there is no guarding or rebound no palpable mass or hepatosplenomegaly, Rectal ;not done, Extremities; no clubbing cyanosis or edema skin warm and dry, Psych; mood and affect normal and appropriate        Assessment & Plan:  #44  46 year old female with recurrent acute mild pancreatitis in the setting of history of severe post ERCP pancreatitis June 2013 requiring prolonged hospitalization. She is currently stable and gradually improving from this most recent episode though clearly not completely resolved.  #2 history of right upper extremity DVT-on Coumadin; this was PICC line related  Plan; Will followup on today's labs,, she is scheduled for MRCP next week She will continue a full liquid to  bland low-fat diet with gradual advancement. Continue Phenergan 12.5-25 mg every 6 hours as needed for nausea, refill sent today Continue morphine 15 mg tablet, take half every 4-6 hours as needed for pain. Followup office visit with Dr. Lina Sar in 10-14 days

## 2012-07-03 NOTE — Patient Instructions (Addendum)
You are scheduled for the MRCP on Monday at 1 PM. We will call you with the lab and MRCP results.  Push fluids, soft diet, full liquid.  We sent a prescription to [facility] for Phenergan.

## 2012-07-06 ENCOUNTER — Other Ambulatory Visit: Payer: Self-pay | Admitting: Physician Assistant

## 2012-07-06 ENCOUNTER — Ambulatory Visit (HOSPITAL_COMMUNITY)
Admission: RE | Admit: 2012-07-06 | Discharge: 2012-07-06 | Disposition: A | Discharge status: Home / Self Care | Payer: Managed Care, Other (non HMO) | Source: Ambulatory Visit | Attending: Physician Assistant | Admitting: Physician Assistant

## 2012-07-06 DIAGNOSIS — K859 Acute pancreatitis without necrosis or infection, unspecified: Secondary | ICD-10-CM | POA: Insufficient documentation

## 2012-07-06 MED ORDER — GADOBENATE DIMEGLUMINE 529 MG/ML IV SOLN
16.0000 mL | Freq: Once | INTRAVENOUS | Status: AC | PRN
  Administered 2012-07-06: 16 mL via INTRAVENOUS

## 2012-07-15 ENCOUNTER — Telehealth: Payer: Self-pay | Admitting: *Deleted

## 2012-07-15 NOTE — Telephone Encounter (Signed)
Spoke with patient and told her MD switch to Dr. Marina Goodell has been approved. Scheduled patient with Dr. Marina Goodell  On 08/06/12 at 10:15 AM.

## 2012-07-15 NOTE — Telephone Encounter (Signed)
Left a message for patient to call me. 

## 2012-07-17 ENCOUNTER — Ambulatory Visit: Payer: Managed Care, Other (non HMO) | Admitting: Internal Medicine

## 2012-07-30 ENCOUNTER — Inpatient Hospital Stay (HOSPITAL_COMMUNITY)
Admission: AD | Admit: 2012-07-30 | Discharge: 2012-08-03 | DRG: 440 | Disposition: A | Discharge status: Home / Self Care | Payer: Managed Care, Other (non HMO) | Source: Ambulatory Visit | Attending: Gastroenterology | Admitting: Gastroenterology

## 2012-07-30 ENCOUNTER — Encounter: Payer: Self-pay | Admitting: Physician Assistant

## 2012-07-30 ENCOUNTER — Ambulatory Visit (HOSPITAL_COMMUNITY)
Admission: AD | Admit: 2012-07-30 | Payer: Managed Care, Other (non HMO) | Source: Ambulatory Visit | Admitting: Gastroenterology

## 2012-07-30 ENCOUNTER — Ambulatory Visit (INDEPENDENT_AMBULATORY_CARE_PROVIDER_SITE_OTHER): Payer: Managed Care, Other (non HMO) | Admitting: Physician Assistant

## 2012-07-30 ENCOUNTER — Encounter (HOSPITAL_COMMUNITY): Payer: Self-pay

## 2012-07-30 ENCOUNTER — Telehealth: Payer: Self-pay | Admitting: Internal Medicine

## 2012-07-30 VITALS — BP 112/78 | HR 75 | Ht 64.0 in | Wt 177.2 lb

## 2012-07-30 DIAGNOSIS — R197 Diarrhea, unspecified: Secondary | ICD-10-CM | POA: Diagnosis not present

## 2012-07-30 DIAGNOSIS — K859 Acute pancreatitis without necrosis or infection, unspecified: Secondary | ICD-10-CM

## 2012-07-30 DIAGNOSIS — I82629 Acute embolism and thrombosis of deep veins of unspecified upper extremity: Secondary | ICD-10-CM

## 2012-07-30 DIAGNOSIS — K8689 Other specified diseases of pancreas: Secondary | ICD-10-CM | POA: Diagnosis present

## 2012-07-30 DIAGNOSIS — I82621 Acute embolism and thrombosis of deep veins of right upper extremity: Secondary | ICD-10-CM

## 2012-07-30 DIAGNOSIS — Z86718 Personal history of other venous thrombosis and embolism: Secondary | ICD-10-CM

## 2012-07-30 DIAGNOSIS — R1013 Epigastric pain: Secondary | ICD-10-CM

## 2012-07-30 DIAGNOSIS — Z7901 Long term (current) use of anticoagulants: Secondary | ICD-10-CM

## 2012-07-30 DIAGNOSIS — R933 Abnormal findings on diagnostic imaging of other parts of digestive tract: Secondary | ICD-10-CM

## 2012-07-30 LAB — COMPREHENSIVE METABOLIC PANEL
ALT: 27 U/L (ref 0–35)
AST: 21 U/L (ref 0–37)
Albumin: 4.1 g/dL (ref 3.5–5.2)
CO2: 24 mEq/L (ref 19–32)
Calcium: 9.5 mg/dL (ref 8.4–10.5)
Chloride: 104 mEq/L (ref 96–112)
Creatinine, Ser: 0.55 mg/dL (ref 0.50–1.10)
Sodium: 138 mEq/L (ref 135–145)

## 2012-07-30 LAB — CBC
MCH: 29.5 pg (ref 26.0–34.0)
MCV: 86.6 fL (ref 78.0–100.0)
Platelets: 298 10*3/uL (ref 150–400)
RDW: 13.4 % (ref 11.5–15.5)

## 2012-07-30 LAB — AMYLASE: Amylase: 1181 U/L — ABNORMAL HIGH (ref 0–105)

## 2012-07-30 LAB — PROTIME-INR: INR: 2.37 — ABNORMAL HIGH (ref 0.00–1.49)

## 2012-07-30 MED ORDER — MORPHINE SULFATE 2 MG/ML IJ SOLN
2.0000 mg | INTRAMUSCULAR | Status: DC | PRN
  Administered 2012-07-30 – 2012-08-02 (×9): 2 mg via INTRAVENOUS
  Filled 2012-07-30 (×9): qty 1

## 2012-07-30 MED ORDER — WARFARIN SODIUM 10 MG PO TABS
10.0000 mg | ORAL_TABLET | ORAL | Status: DC
  Administered 2012-07-31: 10 mg via ORAL
  Filled 2012-07-30 (×2): qty 1

## 2012-07-30 MED ORDER — KCL IN DEXTROSE-NACL 20-5-0.45 MEQ/L-%-% IV SOLN
INTRAVENOUS | Status: DC
  Administered 2012-07-30 – 2012-07-31 (×4): via INTRAVENOUS
  Administered 2012-08-01 (×2): 175 mL/h via INTRAVENOUS
  Administered 2012-08-01 (×2): via INTRAVENOUS
  Administered 2012-08-02: 175 mL/h via INTRAVENOUS
  Administered 2012-08-02: 07:00:00 via INTRAVENOUS
  Administered 2012-08-02: 175 mL/h via INTRAVENOUS
  Filled 2012-07-30 (×18): qty 1000

## 2012-07-30 MED ORDER — WARFARIN - PHYSICIAN DOSING INPATIENT
Freq: Every day | Status: DC
  Administered 2012-07-31: 18:00:00

## 2012-07-30 MED ORDER — WARFARIN SODIUM 5 MG PO TABS
5.0000 mg | ORAL_TABLET | ORAL | Status: DC
  Administered 2012-07-30 – 2012-08-01 (×2): 5 mg via ORAL
  Filled 2012-07-30 (×3): qty 1

## 2012-07-30 MED ORDER — PANTOPRAZOLE SODIUM 40 MG IV SOLR
40.0000 mg | Freq: Every day | INTRAVENOUS | Status: DC
  Administered 2012-07-30 – 2012-08-02 (×4): 40 mg via INTRAVENOUS
  Filled 2012-07-30 (×6): qty 40

## 2012-07-30 MED ORDER — BUPROPION HCL ER (XL) 300 MG PO TB24
300.0000 mg | ORAL_TABLET | Freq: Every day | ORAL | Status: DC
  Administered 2012-07-30 – 2012-08-03 (×5): 300 mg via ORAL
  Filled 2012-07-30 (×6): qty 1

## 2012-07-30 MED ORDER — PROMETHAZINE HCL 25 MG/ML IJ SOLN
12.5000 mg | Freq: Four times a day (QID) | INTRAMUSCULAR | Status: DC | PRN
  Administered 2012-07-30: 12.5 mg via INTRAVENOUS
  Administered 2012-07-30 – 2012-08-02 (×5): 25 mg via INTRAVENOUS
  Filled 2012-07-30 (×6): qty 1

## 2012-07-30 NOTE — Progress Notes (Signed)
Pt received as direct admit from Dr Anselm Jungling office. There was an issue with patients information entered as something other than an "inpatient", therefore we do not have orders at this time. Patient is aware and understands the delay in her care. The charge RN is communicating with Admitting concerning the matter.

## 2012-07-30 NOTE — Telephone Encounter (Signed)
Pt states that she started having abdominal pain yesterday. States that when she ate breakfast this morning it started hurting very bad, like she hurt before Thanksgiving. Pt requests to be seen. Pt scheduled to see Mike Gip PA today at 11am. Pt aware of appt date and time.

## 2012-07-30 NOTE — H&P (Signed)
Primary Care Physician:  Londell Moh, MD Primary Gastroenterologist:  Dr.John Marina Goodell  CHIEF COMPLAINT:  Acute severe abdominal pain,pancreatitis  HPI: Cassie Berry is a 46 y.o. female who had a prolonged hospitalization in June of 2013 with post ERCP pancreatitis which was severe. Her course was complicated by a right upper treatment he DVT and she is now maintained on Coumadin. 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 She had been doing well until she had recurrent upper abdominal pain and nausea on 06/25/2012 and was readmitted to [facility] after outpatient labs were consistent with pancreatitis. CT scan done at that time showed recurrent acute pancreatitis involving the head and uncinate process with peripancreatic inflammatory changes but no pseudocysts and no evident dilation of the pancreatic or common bile duct. Her lipase was in the 1000 range, liver tests were normal. She had very quick resolution of her symptoms, and 24 hours later really was not admitting to any abdominal pain and was able to tolerate full liquids and no 4 was discharged home. She came back in the office for followup on 07/03/2012 , and at that time was doing well with mild intermittent abdominal discomfort but no difficulty taking by mouth his etc. She underwent MRCP and MRI on 07/06/2012 and this showed mild peripancreatic inflammation consistent with resolving pancreatitis, no organized fluid collection. There is history to ring of the distal pancreatic duct leading up to the ampulla, this extends over and 11 mm segment and has a beaded appearance. Proximal to the stricture the pancreatic duct is mildly dilated to 3-4 mm, the bile duct is not dilated, there is an accessory duct draining the uncinate. Cassie Berry comes back in today stating that she  has had more pain this week and has had to take her pain medication more regularly. Her pain was worse yesterday and she was unable to each any solid food. Today she tried to eat a little bit of breakfast and developed much worse pain again she is now also associated with nausea. She says she's having pain in her epigastrium into her back and up into her chest. She says this feels like her pancreas and is worse than what she experienced in November   Past Medical History  Diagnosis Date  . GERD (gastroesophageal reflux disease)   . Anxiety   . Migraine headache   . PONV (postoperative nausea and vomiting)   . Pneumonia     02/2011  . Pancreatitis   . Duodenal stenosis   . DVT (deep venous thrombosis)   . Duodenitis   . Depression     Past Surgical History  Procedure Date  . Appendectomy   . Cholecystectomy   . Ercp 01/23/2012    Procedure: ENDOSCOPIC RETROGRADE CHOLANGIOPANCREATOGRAPHY (ERCP);  Surgeon: Louis Meckel, MD;  Location: [facility];  Service: Endoscopy;  Laterality: N/A;  . Sphincterotomy 01/23/2012    Procedure: SPHINCTEROTOMY;  Surgeon: Louis Meckel, MD;  Location: [facility];  Service: Endoscopy;  Laterality: N/A;  . Esophagogastroduodenoscopy 01/25/2012    Procedure: ESOPHAGOGASTRODUODENOSCOPY (EGD);  Surgeon: Rachael Fee, MD;  Location: [facility];  Service: Endoscopy;  Laterality: N/A;  . Esophagogastroduodenoscopy 02/20/2012    Procedure: ESOPHAGOGASTRODUODENOSCOPY (EGD);  Surgeon: Meryl Dare, MD,FACG;  Location: [facility];  Service: Endoscopy;  Laterality: N/A;  ?feeding tube placement at same time post pyloric tube  Prior to Admission medications   Medication Sig Start Date End Date Taking? Authorizing Provider  buPROPion (WELLBUTRIN XL) 300 MG 24 hr tablet Take 300 mg by mouth daily.   Yes Historical Provider, MD  cetirizine (ZYRTEC) 10 MG tablet Take 10 mg by mouth daily as needed. For allergies.   Yes Historical Provider, MD   cholecalciferol (VITAMIN D) 1000 UNITS tablet Take 1,000 Units by mouth daily.   Yes Historical Provider, MD  cyclobenzaprine (FLEXERIL) 10 MG tablet Take 10 mg by mouth 3 (three) times daily as needed. For pain.   Yes Historical Provider, MD  morphine (MSIR) 15 MG tablet Take 1/2 tablet every 4-6 hours prn pain 07/01/12  Yes Shenetta Schnackenberg S Ascher Schroepfer, PA  promethazine (PHENERGAN) 25 MG tablet Take 1 tablet (25 mg total) by mouth every 6 (six) hours as needed for nausea. 07/03/12  Yes Shayann Garbutt S Tajah Noguchi, PA  warfarin (COUMADIN) 10 MG tablet Take 5-10 mg by mouth daily. 1 tab on MWF; 0.5 tab on other days.   Yes Historical Provider, MD    Current Outpatient Prescriptions  Medication Sig Dispense Refill  . buPROPion (WELLBUTRIN XL) 300 MG 24 hr tablet Take 300 mg by mouth daily.      . cetirizine (ZYRTEC) 10 MG tablet Take 10 mg by mouth daily as needed. For allergies.      . cholecalciferol (VITAMIN D) 1000 UNITS tablet Take 1,000 Units by mouth daily.      . cyclobenzaprine (FLEXERIL) 10 MG tablet Take 10 mg by mouth 3 (three) times daily as needed. For pain.      Marland Kitchen morphine (MSIR) 15 MG tablet Take 1/2 tablet every 4-6 hours prn pain  30 tablet  0  . promethazine (PHENERGAN) 25 MG tablet Take 1 tablet (25 mg total) by mouth every 6 (six) hours as needed for nausea.  30 tablet  1  . warfarin (COUMADIN) 10 MG tablet Take 5-10 mg by mouth daily. 1 tab on MWF; 0.5 tab on other days.        Allergies as of 07/30/2012 - Review Complete 07/30/2012  Allergen Reaction Noted  . Macrodantin Rash 11/28/2010  . Dilaudid (hydromorphone hcl)  04/10/2012  . Nitrofurantoin      Family History  Problem Relation Age of Onset  . Colon cancer Cousin     Paternal side  . Colon cancer Paternal Grandfather   . Lung cancer Mother   . Diabetes Father   . Heart disease Father   . Prostate cancer Paternal Grandfather   . Breast cancer Maternal Aunt     History   Social History  . Marital Status: Married    Spouse  Name: N/A    Number of Children: 2  . Years of Education: N/A   Occupational History  . Not on file.   Social History Main Topics  . Smoking status: Former Smoker    Types: Cigarettes    Quit date: 08/12/1988  . Smokeless tobacco: Never Used  . Alcohol Use: Yes     Comment: Occasional  . Drug Use: No  . Sexually Active: Not on file   Other Topics Concern  . Not on file   Social History Narrative   Daily caffeine  \  Review of Systems: Pertinent positive and negative review of systems were noted in the above HPI section.  All other review of systems was otherwise negative. Physical Exam: Vital signs in last 24 hours: @VSRANGES @   General:   Alert,  well-developed, somewhat  ill-appearing white female in no acute distress, dressed in pajamas  and holding her abdomen Head:  Normocephalic and atraumatic. Eyes:  Sclera clear, no icterus.   Conjunctiva pink. Ears:  Normal auditory acuity. Mouth:  No deformity or lesions.  Oropharynx pink & moist. Neck:  Supple; no masses or thyromegaly. Lungs:  Clear throughout to auscultation.   No wheezes, crackles, or rhonchi. No acute distress. Heart:  Regular rate and rhythm; no murmurs,mild tachycardia. Abdomen:  Soft, tender in the epigastrium, and nondistended. No masses, hepatomegaly. No obvious masses.  Normal bowel sounds    Rectal:  Not done  Msk:  Symmetrical without gross deformities. Normal posture. Pulses:  Normal pulses noted. Extremities:  Without edema. Neurologic:  Alert and  oriented x4;  grossly normal neurologically. Skin:  Intact without significant lesions or rashes. Cervical Nodes:  No significant cervical adenopathy. Psych:  Alert and cooperative. Normal mood and affect.  Lab Results: Pending  Studies/Results: No results found.  Impression / Plan:   #5 46 year old female with history of severe post ERCP pancreatitis June 2013 with prolonged hospitalization, now with recurrent mild pancreatitis November 2013  which never completely resolved and now presents with progressive pain over the past 2-3 days which has been constant more severe and associated with nausea. This is most consistent with recurrent acute/subacute pancreatitis. Cannot rule out pancreatic pseudocyst etc. 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  Plan; patient is admitted to the GI service for supportive management with volume replacement, bowel rest and pain control. Will obtain baseline labs including amylase and lipase Cover with IV protonix. She will likely need repeat CT of the abdomen and pelvis but will hold off over the next 24 hours and observe her clinical course. Expect that she will need referral to tertiary care center for management of her pancreatic duct stricture, that   will be discussed further once this acute episode improves.      @RRHLOS @  Jimi Schappert  07/30/2012, 1:06 PM

## 2012-07-30 NOTE — Progress Notes (Signed)
Dr Christella Hartigan with [facility] notified of Lipase of >3000 & Amalyse 1181. No new orders given.

## 2012-07-30 NOTE — Progress Notes (Signed)
Patient ID: Cassie Berry, female   DOB: [birth year], 47 y.o.   MRN: [id number]  Patient is admitted to [facility] from the office today with recurrent acute pancreatitis. For details please see the admission H&P.

## 2012-07-30 NOTE — Patient Instructions (Addendum)
You can go to [facility] Admitting, 1st floor. You will have a room at [facility], 3rd floor.  Dr. Russella Dar will be the admitting M.D.

## 2012-07-30 NOTE — Progress Notes (Signed)
I have taken a history, examined the patient and reviewed the chart. I agree with the Advanced Practitioner's note, impression and recommendations.  Meryl Dare MD [facility]

## 2012-07-31 DIAGNOSIS — K8689 Other specified diseases of pancreas: Secondary | ICD-10-CM

## 2012-07-31 LAB — CBC
HCT: 32.9 % — ABNORMAL LOW (ref 36.0–46.0)
Hemoglobin: 11.5 g/dL — ABNORMAL LOW (ref 12.0–15.0)
RBC: 3.81 MIL/uL — ABNORMAL LOW (ref 3.87–5.11)
WBC: 4.2 10*3/uL (ref 4.0–10.5)

## 2012-07-31 LAB — BASIC METABOLIC PANEL
Chloride: 107 mEq/L (ref 96–112)
GFR calc Af Amer: >90 mL/min (ref >90)
GFR calc non Af Amer: >90 mL/min (ref >90)
Potassium: 3.8 mEq/L (ref 3.5–5.1)
Sodium: 138 mEq/L (ref 135–145)

## 2012-07-31 LAB — PROTIME-INR
INR: 2.62 — ABNORMAL HIGH (ref 0.00–1.49)
Prothrombin Time: 26.7 seconds — ABNORMAL HIGH (ref 11.6–15.2)

## 2012-07-31 NOTE — Progress Notes (Signed)
[facility] Progress Note  SUBJECTIVE: Feels good today. No pain meds this am  OBJECTIVE:  Vital signs in last 24 hours: Temp:  [98 F (36.7 C)-98.3 F (36.8 C)] 98 F (36.7 C) (12/20 0600) Pulse Rate:  [73-85] 75  (12/20 0600) Resp:  [16-18] 18  (12/20 0600) BP: (110-121)/(60-79) 110/60 mmHg (12/20 0600) SpO2:  [97 %-98 %] 98 % (12/20 0600) Weight:  [177 lb (80.287 kg)-177 lb 3.2 oz (80.377 kg)] 177 lb (80.287 kg) (12/19 1215) Last BM Date: 07/29/12 General:    Pleasant white female in NAD Heart:  Regular rate and rhythm Lungs: Respirations even and unlabored, lungs CTA bilaterally Abdomen:  Soft, nondistended, mild epigastric tenderness, hypoactivel bowel sounds. Extremities:  Without edema. Neurologic:  Alert and oriented,  grossly normal neurologically. Psych:  Cooperative. Normal mood and affect.  ILab Results:  Basename 07/31/12 0350 07/30/12 1516  WBC 4.2 7.5  HGB 11.5* 13.0  HCT 32.9* 38.1  PLT 280 298   BMET  Basename 07/31/12 0350 07/30/12 1516  NA 138 138  K 3.8 3.8  CL 107 104  CO2 24 24  GLUCOSE 105* 87  BUN 8 12  CREATININE 0.56 0.55  CALCIUM 8.4 9.5   LFT  Basename 07/30/12 1516  PROT 7.5  ALBUMIN 4.1  AST 21  ALT 27  ALKPHOS 93  BILITOT 0.2*  BILIDIR --  IBILI --   PT/INR  Basename 07/30/12 1516  LABPROT 24.8*  INR 2.37*    ASSESSMENT / PLAN:  81. 46 year old female with history of severe post ERCP pancreatitis June 2013. Now with recurrent pancreatitis, MRCP last month reveals PD stricture. Alesia felt terrible yesterday, she feels much better today (without pain meds this am). She is NPO, getting IVF at 175cc/hr. Continue supportive care. She is afebrile, WBC normal.   2.  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  history of PICC line associated DVT-on Coumadin INR yesterday was 2.37.  4.  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 I agree with the Advanced Practitioner's note, impression and recommendations. Significantly improved today. Recheck amylase and lipase tomorrow. Continue bowel rest and IV hydration. Consider clear liquids tomorrow if she continues to improve.  Venita Lick. Russella Dar MD Clementeen Graham

## 2012-07-31 NOTE — Progress Notes (Signed)
PHARMACIST - PHYSICIAN COMMUNICATION DR:  Russella Dar CONCERNING: Pharmacy Care Issues Regarding Warfarin Labs  RECOMMENDATION (Action Taken): A daily protime for three days has been ordered to meet the [facility] National Patient safety goal and comply with the current [facility] Pharmacy & Therapeutics Committee policy.   The Pharmacy will defer all warfarin dose order changes and follow up of lab results to the prescriber unless an additional order to initiate a "pharmacy Coumadin consult" is placed.  DESCRIPTION:  While hospitalized, to be in compliance with [employer] National Patient Safety Goals, all patients on warfarin must have a baseline and/or current protime prior to the administration of warfarin. Pharmacy has received your order for warfarin without these required laboratory assessments.   Gwen Her PharmD  [phone] 07/31/2012 11:22 AM

## 2012-07-31 NOTE — Progress Notes (Signed)
CARE MANAGEMENT NOTE 07/31/2012  Patient:  Cassie Berry, Cassie Berry   Account Number:  [id number]  Date Initiated:  07/31/2012  Documentation initiated by:  DAVIS,RHONDA  Subjective/Objective Assessment:   admitted withy acute pancreatitis     Action/Plan:   from home   Anticipated DC Date:  08/03/2012   Anticipated DC Plan:  HOME/SELF CARE  In-house referral  NA      DC Planning Services  NA      [facility] Choice  NA   Choice offered to / List presented to:  NA   DME arranged  NA      DME agency  NA     HH arranged  NA      HH agency  NA   Status of service:  In process, will continue to follow Medicare Important Message given?  NA - LOS <3 / Initial given by admissions (If response is "NO", the following Medicare IM given date fields will be blank) Date Medicare IM given:   Date Additional Medicare IM given:    Discharge Disposition:    Per UR Regulation:  Reviewed for med. necessity/level of care/duration of stay  If discussed at Long Length of Stay Meetings, dates discussed:    Comments:  [id number] Earlene Plater, RN, BSN, CCM: CHART REVIEWED AND UPDATED.  Next chart review due on [id number]. NO DISCHARGE NEEDS PRESENT AT THIS TIME. CASE MANAGEMENT [phone]

## 2012-08-01 LAB — CLOSTRIDIUM DIFFICILE BY PCR: Toxigenic C. Difficile by PCR: NEGATIVE

## 2012-08-01 LAB — PROTIME-INR
INR: 2.76 — ABNORMAL HIGH (ref 0.00–1.49)
Prothrombin Time: 27.8 seconds — ABNORMAL HIGH (ref 11.6–15.2)

## 2012-08-01 MED ORDER — BIOTENE DRY MOUTH MT LIQD
15.0000 mL | Freq: Two times a day (BID) | OROMUCOSAL | Status: DC
  Administered 2012-08-02 (×2): 15 mL via OROMUCOSAL

## 2012-08-01 MED ORDER — CYCLOBENZAPRINE HCL 5 MG PO TABS
5.0000 mg | ORAL_TABLET | Freq: Three times a day (TID) | ORAL | Status: DC | PRN
  Administered 2012-08-02 (×2): 5 mg via ORAL
  Filled 2012-08-01 (×2): qty 1

## 2012-08-01 MED ORDER — CHLORHEXIDINE GLUCONATE 0.12 % MT SOLN
15.0000 mL | Freq: Two times a day (BID) | OROMUCOSAL | Status: DC
  Administered 2012-08-02 (×2): 15 mL via OROMUCOSAL
  Filled 2012-08-01 (×3): qty 15

## 2012-08-01 NOTE — Progress Notes (Signed)
Subjective: Cross cover [facility] Patient complains of diarrhea with 5 loose BM's this morning. Denies abdominal pian. Has had some back pain. No fever, chills or nausea.  Objective: Vital signs in last 24 hours: Temp:  [97.9 F (36.6 C)-98.9 F (37.2 C)] 97.9 F (36.6 C) (12/21 0530) Pulse Rate:  [67-81] 67  (12/21 0530) Resp:  [16-18] 18  (12/21 0530) BP: (101-107)/(58-66) 101/61 mmHg (12/21 0530) SpO2:  [96 %-100 %] 100 % (12/21 0530) Last BM Date: 07/29/12  Intake/Output from previous day: 12/20 0701 - 12/21 0700 In: 2625 [I.V.:2625] Out: -  Intake/Output this shift: Total I/O In: 1102.5 [I.V.:1102.5] Out: -   General appearance: alert, cooperative, appears stated age, no distress and pale Resp: clear to auscultation bilaterally Cardio: regular rate and rhythm, S1, S2 normal, no murmur, click, rub or gallop GI: soft, non-tender; bowel sounds normal; no masses,  no organomegaly Extremities: extremities normal, atraumatic, no cyanosis or edema  Lab Results:  [facility] 07/31/12 0350 07/30/12 1516  WBC 4.2 7.5  HGB 11.5* 13.0  HCT 32.9* 38.1  PLT 280 298   BMET  Basename 07/31/12 0350 07/30/12 1516  NA 138 138  K 3.8 3.8  CL 107 104  CO2 24 24  GLUCOSE 105* 87  BUN 8 12  CREATININE 0.56 0.55  CALCIUM 8.4 9.5   LFT  Basename 07/30/12 1516  PROT 7.5  ALBUMIN 4.1  AST 21  ALT 27  ALKPHOS 93  BILITOT 0.2*  BILIDIR --  IBILI --   PT/INR  Basename 08/01/12 0414 07/31/12 1159  LABPROT 27.8* 26.7*  INR 2.76* 2.62*   Medications: I have reviewed the patient's current medications.  Assessment/Plan: 1) Recurrent pancreatitis secondary to ERCP: now with a pancreatic duct stricture.  2) RUE DVT; On Coumadin.  3) Diarrhea-new  Onset-will check stool studies.  LOS: 2 days   Fin Hupp 08/01/2012, 1:09 PM

## 2012-08-02 LAB — FECAL LACTOFERRIN, QUANT

## 2012-08-02 MED ORDER — BIOTENE DRY MOUTH MT LIQD
15.0000 mL | Freq: Two times a day (BID) | OROMUCOSAL | Status: DC
  Administered 2012-08-03: 15 mL via OROMUCOSAL

## 2012-08-02 MED ORDER — WARFARIN - PHARMACIST DOSING INPATIENT: Freq: Every day | Status: DC

## 2012-08-02 NOTE — Progress Notes (Signed)
ANTICOAGULATION NOTE  Labs:  Basename 08/02/12 0411 08/01/12 0414 07/31/12 1159 07/31/12 0350 07/30/12 1516  HGB -- -- -- 11.5* 13.0  HCT -- -- -- 32.9* 38.1  PLT -- -- -- 280 298  APTT -- -- -- -- --  LABPROT 32.3* 27.8* 26.7* -- --  INR 3.38* 2.76* 2.62* -- --  HEPARINUNFRC -- -- -- -- --  CREATININE -- -- -- 0.56 0.55  CKTOTAL -- -- -- -- --  CKMB -- -- -- -- --  TROPONINI -- -- -- -- --    A/P: Patient currently on home dose of warfarin. INR has continually risen since warfarin restarted and is now supratherapeutic. Warfarin has been placed on hold and daily INR's to be continued.   Hessie Knows, PharmD, BCPS Pager [phone] 08/02/2012 10:51 AM

## 2012-08-02 NOTE — Progress Notes (Signed)
Subjective: Cross cover [facility] Since I last evaluated the patient, she seems to be doing better. Wants to advance her diet. Denies having any nausea or abdominal pain.   Objective: Vital signs in last 24 hours: Temp:  [98.1 F (36.7 C)-98.3 F (36.8 C)] 98.3 F (36.8 C) (12/22 1355) Pulse Rate:  [71-79] 79  (12/22 1355) Resp:  [16-18] 16  (12/22 1355) BP: (100-117)/(59-83) 117/83 mmHg (12/22 1355) SpO2:  [94 %-100 %] 100 % (12/22 1355) Last BM Date: 08/01/12  Intake/Output from previous day: 12/21 0701 - 12/22 0700 In: 4880 [P.O.:1380; I.V.:3500] Out: -  Intake/Output this shift: Total I/O In: 960 [P.O.:960] Out: -   General appearance: alert, cooperative, appears stated age and no distress Resp: clear to auscultation bilaterally Cardio: regular rate and rhythm, S1, S2 normal, no murmur, click, rub or gallop GI: soft, non-tender; bowel sounds normal; no masses,  no organomegaly Extremities: extremities normal, atraumatic, no cyanosis or edema  Lab Results:  [facility] 07/31/12 0350  WBC 4.2  HGB 11.5*  HCT 32.9*  PLT 280   BMET  Basename 07/31/12 0350  NA 138  K 3.8  CL 107  CO2 24  GLUCOSE 105*  BUN 8  CREATININE 0.56  CALCIUM 8.4   LFT No results found for this basename: PROT,ALBUMIN,AST,ALT,ALKPHOS,BILITOT,BILIDIR,IBILI in the last 72 hours PT/INR  Basename 08/02/12 0411 08/01/12 0414  LABPROT 32.3* 27.8*  INR 3.38* 2.76*  Studies/Results: No results found.  Medications: I have reviewed the patient's current medications.  Assessment/Plan: Recurrent pancreatitis with PD stricture; will advance her diet to a low fat diet and anticipate d/c in AM. She will need a follow up with her gastroenterologist for further treatment of the pancreatic duct stricture.  LOS: 3 days   Symphonie Schneiderman 08/02/2012, 4:37 PM

## 2012-08-03 LAB — STOOL CULTURE

## 2012-08-03 LAB — PROTIME-INR: INR: 2.71 — ABNORMAL HIGH (ref 0.00–1.49)

## 2012-08-03 MED ORDER — WARFARIN SODIUM 5 MG PO TABS
5.0000 mg | ORAL_TABLET | Freq: Once | ORAL | Status: DC
  Filled 2012-08-03: qty 1

## 2012-08-03 MED ORDER — MORPHINE SULFATE 15 MG PO TABS: ORAL_TABLET | ORAL | Status: DC

## 2012-08-03 NOTE — Progress Notes (Signed)
[facility] Progress Note  SUBJECTIVE: feels okay, want to go home  OBJECTIVE:  Vital signs in last 24 hours: Temp:  [97.5 F (36.4 C)-98.5 F (36.9 C)] 97.5 F (36.4 C) (12/23 0610) Pulse Rate:  [76-82] 76  (12/23 0610) Resp:  [16-19] 19  (12/23 0610) BP: (99-122)/(56-83) 99/56 mmHg (12/23 0610) SpO2:  [97 %-100 %] 98 % (12/23 0610) Last BM Date: 08/01/12 General:    Pleasant white female in NAD Heart:  Regular rate and rhythm Lungs: Respirations even and unlabored, lungs CTA bilaterally Abdomen:  Soft, nontender and nondistended. Normal bowel sounds. Extremities:  Without edema. Neurologic:  Alert and oriented,  grossly normal neurologically. Psych:  Cooperative. Normal mood and affect.  PT/INR  Basename 08/02/12 0411 08/01/12 0414  LABPROT 32.3* 27.8*  INR 3.38* 2.76*    ASSESSMENT / PLAN:  65. 46 year old female with history of severe post ERCP pancreatitis June 2013. Now with recurrent pancreatitis, MRCP last month reveals PD stricture. Roschelle felt terrible on day of admission but since then has showed significant improvement. She is tolerating a low fat diet without significant abdominal pain or nausea. She wants to go home and is medically stable for discharge. Davena already has an appointment to see Dr. Marina Goodell later this week.   2. History of PICC line associated DVT. On Coumadin. Continue current coumadin dose. INR yesterday was 3.38.  3.  Loose stool. C-diff and lactoferrin negative. An O+P was ordered over weekend (pending). Doubt infectious etiology. No diarrhea this am.     LOS: 4 days   [name]  08/03/2012, 8:27 AM  MRCP demonstrates 2 pancreatic duct strictures which likely are causing recurrent bouts of pancreatitis. Patient will be seen at [facility] regarding endoscopic therapy of the strictures.

## 2012-08-03 NOTE — Progress Notes (Signed)
ANTICOAGULATION CONSULT NOTE - Follow Up Consult  Pharmacy Consult for Coumadin Indication: History of DVT  Allergies  Allergen Reactions  . Macrodantin Rash  . Dilaudid (Hydromorphone Hcl)     Hallucinations   . Nitrofurantoin Rash    Patient Measurements: Height: 5\' 4"  (162.6 cm) Weight: 177 lb (80.287 kg) IBW/kg (Calculated) : 54.7    Vital Signs: Temp: 97.5 F (36.4 C) (12/23 0610) Temp src: Oral (12/23 0610) BP: 99/56 mmHg (12/23 0610) Pulse Rate: 76  (12/23 0610)  Labs:  Basename 08/03/12 0815 08/02/12 0411 08/01/12 0414  HGB -- -- --  HCT -- -- --  PLT -- -- --  APTT -- -- --  LABPROT 27.4* 32.3* 27.8*  INR 2.71* 3.38* 2.76*  HEPARINUNFRC -- -- --  CREATININE -- -- --  CKTOTAL -- -- --  CKMB -- -- --  TROPONINI -- -- --    Estimated Creatinine Clearance: 90 ml/min (by C-G formula based on Cr of 0.56).   Medications:  Scheduled:    . antiseptic oral rinse  15 mL Mouth Rinse BID  . buPROPion  300 mg Oral Daily  . pantoprazole (PROTONIX) IV  40 mg Intravenous QHS  . Warfarin - Pharmacist Dosing Inpatient   Does not apply q1800  . [DISCONTINUED] antiseptic oral rinse  15 mL Mouth Rinse q12n4p  . [DISCONTINUED] chlorhexidine  15 mL Mouth Rinse BID  . [DISCONTINUED] warfarin  10 mg Oral Custom  . [DISCONTINUED] warfarin  5 mg Oral Custom  . [DISCONTINUED] Warfarin - Physician Dosing Inpatient   Does not apply q1800    Assessment: 46 YO on coumadin PTA for history of DVT. Home dose 10 mg MWF, 5 mg TTSS. Pharmacy was asked to manage coumadin 12/22 d/t supratherapeutic INR  INR 2.71 today = at goal  Patient discharge noted today   Goal of Therapy:  INR 2-3 Monitor platelets by anticoagulation protocol: Yes   Plan:  1. If patient still here, give coumadin 5 mg PO x 1 tonight  [employer], [address].D. Clinical Oncology Pharmacist  Pager # [phone]  08/03/2012,10:42 AM

## 2012-08-03 NOTE — Discharge Summary (Signed)
Cassie Berry  Name: Cassie Berry MRN: [id number] DOB: 05/08/1966 46 y.o. PCP:  Londell Moh, MD  Date of Admission: 07/30/2012  2:27 PM Date of Discharge: 08/03/2012 Primary Gastroenterologist: Yancey Flemings, MD Discharging Physician: [name], MD  Discharge Diagnosis: 1. Recurrent pancreatitis, resolving.  2. 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 Patient tolerating by mouth intake now without difficulty.   3. diarrhea, unclear etiology. Stool studies negative so far. No diarrhea this a.m.   4. History of DVT, on chronic Coumadin.  Consultations:none  Procedures Performed:  Mr 3d Recon At Scanner  07/06/2012  *RADIOLOGY REPORT*  Clinical Data:  Pancreatitis, recurrent.  Prior cholecystectomy sphincterotomy  MRI ABDOMEN WITHOUT AND WITH CONTRAST (MRCP)  Technique:  Multiplanar multisequence MR imaging of the abdomen was performed without and with contrast, including heavily T2-weighted images of the biliary and pancreatic ducts.  Three-dimensional MR images were rendered by post processing of the original MR data.  Contrast: 16mL MULTIHANCE GADOBENATE DIMEGLUMINE 529 MG/ML IV SOLN  Comparison:  CT 06/25/2012, MRI 09/04/2009  Findings:  There is mild inflammation surround the pancreatic head and body.  There is no organized fluid collections.  The distal pancreatic duct is strictured over an 11 mm segment leading up to the ampulla with a beaded appearance best seen on image 46, series 400.  Proximal to the stricture , the pancreatic duct is mildly dilated to 3 to 4 mm.  The common bile duct is not dilated.  There is no filling defects within the common bile duct.  No significant intrahepatic biliary dilatation.  There is a accessory duct draining the uncinate.  On the postcontrast series, the pancreatic parenchyma enhances uniformly without evidence of necrosis.  The no enhancing hepatic lesion.  The spleen, adrenal  glands, and kidneys are normal.  The splenic vein and portal veins are patent.  Abdominal aorta is normal.  No vascular complication.  The abdominal lymphadenopathy.  No pericardial fluid or pleural fluid.  IMPRESSION:  1.  Mild peripancreatic inflammation consistent with resolving pancreatitis. No organized fluid collection. 2.  Stricturing of the distal pancreatic duct leading up to the ampulla.  No mass lesion identified. 4.  No evidence of filling defect within the common bile duct or biliary ductal dilatation.   Original Report Authenticated By: Genevive Bi, M.D.    Mr Abd W/wo Cm/mrcp  07/06/2012  *RADIOLOGY REPORT*  Clinical Data:  Pancreatitis, recurrent.  Prior cholecystectomy sphincterotomy  MRI ABDOMEN WITHOUT AND WITH CONTRAST (MRCP)  Technique:  Multiplanar multisequence MR imaging of the abdomen was performed without and with contrast, including heavily T2-weighted images of the biliary and pancreatic ducts.  Three-dimensional MR images were rendered by post processing of the original MR data.  Contrast: 16mL MULTIHANCE GADOBENATE DIMEGLUMINE 529 MG/ML IV SOLN  Comparison:  CT 06/25/2012, MRI 09/04/2009  Findings:  There is mild inflammation surround the pancreatic head and body.  There is no organized fluid collections.  The distal pancreatic duct is strictured over an 11 mm segment leading up to the ampulla with a beaded appearance best seen on image 46, series 400.  Proximal to the stricture , the pancreatic duct is mildly dilated to 3 to 4 mm.  The common bile duct is not dilated.  There is no filling defects within the common bile duct.  No significant intrahepatic biliary dilatation.  There is a accessory duct draining the uncinate.  On the postcontrast series, the pancreatic parenchyma enhances uniformly without evidence of  necrosis.  The no enhancing hepatic lesion.  The spleen, adrenal glands, and kidneys are normal.  The splenic vein and portal veins are patent.  Abdominal aorta is  normal.  No vascular complication.  The abdominal lymphadenopathy.  No pericardial fluid or pleural fluid.  IMPRESSION:  1.  Mild peripancreatic inflammation consistent with resolving pancreatitis. No organized fluid collection. 2.  Stricturing of the distal pancreatic duct leading up to the ampulla.  No mass lesion identified. 4.  No evidence of filling defect within the common bile duct or biliary ductal dilatation.   Original Report Authenticated By: Genevive Bi, M.D.     GI Procedures: none  History/Physical Exam:  See Admission H&P  Admission HPI: Per Amy Monica Becton, P.A. - C Cassie Berry is a 46 y.o. female who had a prolonged hospitalization in June of 2013 with post ERCP pancreatitis which was severe. Her course was complicated by a right upper treatment he DVT and she is now maintained on Coumadin. 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 She had been doing well until she had recurrent upper abdominal pain and nausea on 06/25/2012 and was readmitted to [facility] after outpatient labs were consistent with pancreatitis. CT scan done at that time showed recurrent acute pancreatitis involving the head and uncinate process with peripancreatic inflammatory changes but no pseudocysts and no evident dilation of the pancreatic or common bile duct. Her lipase was in the 1000 range, liver tests were normal. She had very quick resolution of her symptoms, and 24 hours later really was not admitting to any abdominal pain and was able to tolerate full liquids and no 4 was discharged home. She came back in the office for followup on 07/03/2012 , and at that time was doing well with mild intermittent abdominal discomfort but no difficulty taking by mouth his etc. She underwent MRCP and MRI on 07/06/2012 and this showed mild peripancreatic  inflammation consistent with resolving pancreatitis, no organized fluid collection. There is history to ring of the distal pancreatic duct leading up to the ampulla, this extends over and 11 mm segment and has a beaded appearance. Proximal to the stricture the pancreatic duct is mildly dilated to 3-4 mm, the bile duct is not dilated, there is an accessory duct draining the uncinate.  Shaakira comes back in today stating that she has had more pain this week and has had to take her pain medication more regularly. Her pain was worse yesterday and she was unable to each any solid food. Today she tried to eat a little bit of breakfast and developed much worse pain again she is now also associated with nausea. She says she's having pain in her epigastrium into her back and up into her chest. She says this feels like her pancreas and is worse than what she experienced in November   Hospital Course by problem list: 1. Recurrent pancreatitis.  Over the summer  Jennilyn has a history of severe post ERCP pancreatitis requiring a prolonged hospitalization from mid June 2013 to Late July 2013.  In mid November she was rehospitalized for a week with a second bout of pancreatitis. Her symptoms improved and she was discharged home. A follow up MRI / MRCP in late November showed resolving pancreatitis as well as a pancreatic duct stricture. Overall, she was doing okay with only mild, intermittent abdominal pain. Then,  patient presented to the office 07/30/12 with progressive upper abdominal pain associated with nausea. She was admitted for further evaluation and treatment. Lipase on day of admission was greater than 3000, LFTs were normal. White count was normal. She was admitted to a medical bed and given IV fluids, analgesics, antiemetics and general supportive care. The following morning (Friday) Byrd Hesselbach felt much better. On Saturday she was given a clear liquid diet which she tolerated without abdominal pain or nausea. The  following day her diet was advanced to low-fat solids which she also tolerated without any difficulty. Patient did complain of diarrhea, C. difficile and stool lactoferrin were negative. Dr. Loreta Ave who was covering for our practice on the weekend also ordered an O. and P., it was pending at the time of discharge. By Monday, 08/03/12, Byrd Hesselbach was medically stable for discharge. She was eager to go home. She had no significant abdominal pain. No nausea at time of discharge.  2. 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 Patient tolerating by mouth intake now without difficulty.   3. diarrhea, unclear etiology. Stool studies negative so far. Stool for O&P ordered by Dr. Loreta Ave who was covering for our practice over the weekend, results pending at time of discharge. No diarrhea this a.m.   4. History of DVT, on chronic Coumadin. INR 2.71 this a.m. continue current Coumadin dose. Outpatient Coumadin management per Dr. Eden Emms..  Discharge Vitals:  BP 99/56  Pulse 76  Temp 97.5 F (36.4 C) (Oral)  Resp 19  Ht 5\' 4"  (1.626 m)  Wt 177 lb (80.287 kg)  BMI 30.38 kg/m2  SpO2 98%  Discharge Labs:  Results for orders placed during the hospital encounter of 07/30/12 (from the past 24 hour(s))  PROTIME-INR     Status: Abnormal   Collection Time   08/03/12  8:15 AM      Component Value Range   Prothrombin Time 27.4 (*) 11.6 - 15.2 seconds   INR 2.71 (*) 0.00 - 1.49    Disposition and follow-up:   Ms.Chase D Pamer was discharged from [facility] in stable condition.    Follow-up Appointments: Discharge Orders    Future Appointments: Provider: Department: Dept Phone: Center:   08/06/2012 10:15 AM Hilarie Fredrickson, MD [facility] [phone] [facility]      Discharge Medications:   Medication List     As of 08/03/2012  9:06 AM    TAKE these medications         buPROPion 300 MG 24 hr tablet   Commonly known as: WELLBUTRIN XL   Take  300 mg by mouth every morning.      morphine 15 MG tablet   Commonly known as: MSIR   Take 7.5 mg by mouth every 4 (four) hours as needed. Take 1/2 tablet every 4-6 hours prn pain      morphine 15 MG tablet   Commonly known as: MSIR   Take 1/2 tablet every 4-6 hours prn pain      promethazine 25 MG tablet   Commonly known as: PHENERGAN   Take 1 tablet (25 mg total) by mouth every 6 (six) hours as needed for nausea.      warfarin 10 MG tablet   Commonly known as: COUMADIN   Take 5-10 mg by mouth daily. 1 tablet (10 mg) every Monday, Wednesday, and Friday.  Take 1/2 tablet (5 mg) every Tuesday, Thursday, Saturday, and Sunday.         Signed: Gunnar Fusi  Coraleigh Sheeran 08/03/2012, 8:34 AM

## 2012-08-04 LAB — OVA AND PARASITE EXAMINATION: Ova and parasites: NONE SEEN

## 2012-08-06 ENCOUNTER — Ambulatory Visit (INDEPENDENT_AMBULATORY_CARE_PROVIDER_SITE_OTHER): Payer: Managed Care, Other (non HMO) | Admitting: Internal Medicine

## 2012-08-06 ENCOUNTER — Encounter: Payer: Self-pay | Admitting: Internal Medicine

## 2012-08-06 VITALS — BP 120/84 | HR 100 | Ht 64.0 in | Wt 174.0 lb

## 2012-08-06 DIAGNOSIS — R1013 Epigastric pain: Secondary | ICD-10-CM

## 2012-08-06 DIAGNOSIS — R110 Nausea: Secondary | ICD-10-CM

## 2012-08-06 DIAGNOSIS — R933 Abnormal findings on diagnostic imaging of other parts of digestive tract: Secondary | ICD-10-CM

## 2012-08-06 DIAGNOSIS — K859 Acute pancreatitis without necrosis or infection, unspecified: Secondary | ICD-10-CM

## 2012-08-06 NOTE — Patient Instructions (Addendum)
Please follow up with Dr. Perry in 4 weeks 

## 2012-08-06 NOTE — Progress Notes (Signed)
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 The patient, previously followed by Dr. Juanda Chance, requests my assuming her GI care and I am seeing her for the first time in this clinic, though I have seen her previously in the hospital as part of the rotating GI team. She underwent ERCP with sphincterotomy and pancreatic duct stent placement in mid June 2013, with Dr. Arlyce Dice, for presumptive sphincter of OD dysfunction. Post procedure developed severe interstitial pancreatitis and was hospitalized for almost 6 weeks. 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 Post discharge, she made slow gradual progress in all respects. She has been on Coumadin for her deep vein thrombosis. Outpatient enteral tube feedings were discontinued in September. She was doing well until November when she was admitted for a few days with a mild recurrence of pancreatitis. Subsequent MRI/MRCP revealed stricturing of the distal pancreatic duct with mild upstream dilation. No other complicating features such as pseudocyst. She recovered uneventfully, but relapsed 07/30/2012 and required readmission for pancreatitis. No repeat imaging. With supportive care and time, she was discharged home 2 days ago. She already had secured this followup appointment. She tells me that she is having some mild postprandial pain. Yesterday required morphine 7.5 mg twice. No pain medication today. Some intermittent nausea but no vomiting. Regulated bowel habits. She is concerned about weight gain. No fevers or other issues. She is fatigued and somewhat discouraged.  REVIEW OF SYSTEMS:  All non-GI ROS negative except for sinus allergy trouble, back pain, cough, fatigue,  muscle cramps  Past Medical History  Diagnosis Date  . GERD (gastroesophageal reflux disease)   . Anxiety   . Migraine headache   . PONV (postoperative nausea and vomiting)   . Pneumonia     02/2011  . Pancreatitis   . Duodenal stenosis   . DVT (deep venous thrombosis)   . Duodenitis   . Depression     Past Surgical History  Procedure Date  . Appendectomy   . Cholecystectomy   . Ercp 01/23/2012    Procedure: ENDOSCOPIC RETROGRADE CHOLANGIOPANCREATOGRAPHY (ERCP);  Surgeon: Louis Meckel, MD;  Location: [facility];  Service: Endoscopy;  Laterality: N/A;  . Sphincterotomy 01/23/2012    Procedure: SPHINCTEROTOMY;  Surgeon: Louis Meckel, MD;  Location: [facility];  Service: Endoscopy;  Laterality: N/A;  . Esophagogastroduodenoscopy 01/25/2012    Procedure: ESOPHAGOGASTRODUODENOSCOPY (EGD);  Surgeon: Rachael Fee, MD;  Location: [facility];  Service: Endoscopy;  Laterality: N/A;  . Esophagogastroduodenoscopy 02/20/2012    Procedure: ESOPHAGOGASTRODUODENOSCOPY (EGD);  Surgeon: Meryl Dare, MD,FACG;  Location: [facility];  Service: Endoscopy;  Laterality: N/A;  ?feeding tube placement at same time post pyloric tube    Social History Cassie Berry  reports that she quit smoking about 24 years ago. Her smoking use included Cigarettes. She has never used smokeless tobacco. She reports that she drinks alcohol. She reports that she does not use illicit drugs.  family history includes Breast cancer in her maternal aunt; Colon cancer in her cousin and paternal grandfather; Diabetes in her father; Heart disease in her father; Lung cancer in her mother; and Prostate cancer in her paternal grandfather.  Allergies  Allergen Reactions  . Macrodantin Rash  . Dilaudid (Hydromorphone Hcl)     Hallucinations   .  Nitrofurantoin Rash       PHYSICAL EXAMINATION: Vital signs: BP 120/84  Pulse 100  Ht 5\' 4"  (1.626 m)  Wt 174 lb (78.926 kg)  BMI 29.87 kg/m2 General:  Well-developed, well-nourished, no acute distress HEENT: Sclerae are anicteric, conjunctiva pink. Oral mucosa intact Lungs: Clear Heart: Regular Abdomen: soft, mild epigastric tenderness to palpation, nondistended, no obvious ascites, no peritoneal signs, normal bowel sounds. No organomegaly. Extremities: No edema Psychiatric: alert and oriented x3. Somewhat flat affect a pleasant. Cooperative    ASSESSMENT:  #1. Severe post ERCP pancreatitis June 2013 #2. History of right upper acute vein thrombosis related to indwelling venous catheter. On Coumadin #3. Rehospitalization with mild and moderate pancreatitis November and December 2013 respectively. Currently with postprandial nausea and discomfort which is manageable #4. 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 Discussion today on pancreatitis and recurrent pancreatitis this relates to her case. #2. Discussion on the potential role for therapeutic endoscopy, namely pancreatic stenting, if pancreatic stricture felt to be significant and contributing to recurrent bouts of pancreatitis. I told her that I would refer her to Dr. Wyline Mood at [facility] for his opinion regarding this should she have further troubles. #3. Continue with low fat diet, activity as tolerated, pain medication and antiemetics,as needed #4. Office followup in 1 month. Contact the office in the interim for questions or problems.

## 2012-08-13 ENCOUNTER — Other Ambulatory Visit: Payer: Self-pay | Admitting: Nurse Practitioner

## 2012-09-06 ENCOUNTER — Emergency Department (HOSPITAL_COMMUNITY): Payer: BC Managed Care – PPO

## 2012-09-06 ENCOUNTER — Telehealth: Payer: Self-pay | Admitting: Internal Medicine

## 2012-09-06 ENCOUNTER — Encounter (HOSPITAL_COMMUNITY): Payer: Self-pay | Admitting: *Deleted

## 2012-09-06 ENCOUNTER — Emergency Department (HOSPITAL_COMMUNITY)
Admission: EM | Admit: 2012-09-06 | Discharge: 2012-09-06 | Disposition: A | Discharge status: Home / Self Care | Payer: BC Managed Care – PPO | Attending: Emergency Medicine | Admitting: Emergency Medicine

## 2012-09-06 DIAGNOSIS — K219 Gastro-esophageal reflux disease without esophagitis: Secondary | ICD-10-CM | POA: Insufficient documentation

## 2012-09-06 DIAGNOSIS — Z8719 Personal history of other diseases of the digestive system: Secondary | ICD-10-CM | POA: Insufficient documentation

## 2012-09-06 DIAGNOSIS — Z79899 Other long term (current) drug therapy: Secondary | ICD-10-CM | POA: Insufficient documentation

## 2012-09-06 DIAGNOSIS — K861 Other chronic pancreatitis: Secondary | ICD-10-CM | POA: Insufficient documentation

## 2012-09-06 DIAGNOSIS — Z7901 Long term (current) use of anticoagulants: Secondary | ICD-10-CM | POA: Insufficient documentation

## 2012-09-06 DIAGNOSIS — Z8679 Personal history of other diseases of the circulatory system: Secondary | ICD-10-CM | POA: Insufficient documentation

## 2012-09-06 DIAGNOSIS — Z8659 Personal history of other mental and behavioral disorders: Secondary | ICD-10-CM | POA: Insufficient documentation

## 2012-09-06 DIAGNOSIS — Z87891 Personal history of nicotine dependence: Secondary | ICD-10-CM | POA: Insufficient documentation

## 2012-09-06 DIAGNOSIS — R112 Nausea with vomiting, unspecified: Secondary | ICD-10-CM | POA: Insufficient documentation

## 2012-09-06 DIAGNOSIS — R109 Unspecified abdominal pain: Secondary | ICD-10-CM

## 2012-09-06 DIAGNOSIS — F3289 Other specified depressive episodes: Secondary | ICD-10-CM | POA: Insufficient documentation

## 2012-09-06 DIAGNOSIS — F329 Major depressive disorder, single episode, unspecified: Secondary | ICD-10-CM | POA: Insufficient documentation

## 2012-09-06 DIAGNOSIS — Z8701 Personal history of pneumonia (recurrent): Secondary | ICD-10-CM | POA: Insufficient documentation

## 2012-09-06 DIAGNOSIS — Z86718 Personal history of other venous thrombosis and embolism: Secondary | ICD-10-CM | POA: Insufficient documentation

## 2012-09-06 DIAGNOSIS — R1013 Epigastric pain: Secondary | ICD-10-CM | POA: Insufficient documentation

## 2012-09-06 LAB — URINE MICROSCOPIC-ADD ON

## 2012-09-06 LAB — URINALYSIS, ROUTINE W REFLEX MICROSCOPIC
Bilirubin Urine: NEGATIVE
Glucose, UA: NEGATIVE mg/dL
Hgb urine dipstick: NEGATIVE
Ketones, ur: NEGATIVE mg/dL
Protein, ur: NEGATIVE mg/dL
Urobilinogen, UA: 0.2 mg/dL (ref 0.0–1.0)

## 2012-09-06 LAB — CBC WITH DIFFERENTIAL/PLATELET
Basophils Absolute: 0 10*3/uL (ref 0.0–0.1)
Basophils Relative: 0 % (ref 0–1)
Eosinophils Absolute: 0 10*3/uL (ref 0.0–0.7)
Eosinophils Relative: 0 % (ref 0–5)
HCT: 38.9 % (ref 36.0–46.0)
Hemoglobin: 13.8 g/dL (ref 12.0–15.0)
MCH: 30.9 pg (ref 26.0–34.0)
MCHC: 35.5 g/dL (ref 30.0–36.0)
Monocytes Absolute: 0.4 10*3/uL (ref 0.1–1.0)
Monocytes Relative: 4 % (ref 3–12)
Neutro Abs: 8.8 10*3/uL — ABNORMAL HIGH (ref 1.7–7.7)
RDW: 13 % (ref 11.5–15.5)

## 2012-09-06 LAB — COMPREHENSIVE METABOLIC PANEL
ALT: 36 U/L — ABNORMAL HIGH (ref 0–35)
AST: 35 U/L (ref 0–37)
Alkaline Phosphatase: 90 U/L (ref 39–117)
CO2: 25 mEq/L (ref 19–32)
GFR calc Af Amer: >90 mL/min (ref >90)
Glucose, Bld: 95 mg/dL (ref 70–99)
Potassium: 4.1 mEq/L (ref 3.5–5.1)
Sodium: 139 mEq/L (ref 135–145)
Total Protein: 7.5 g/dL (ref 6.0–8.3)

## 2012-09-06 MED ORDER — MORPHINE SULFATE 4 MG/ML IJ SOLN: 4.0000 mg | Freq: Once | INTRAMUSCULAR | Status: DC

## 2012-09-06 MED ORDER — ONDANSETRON HCL 4 MG/2ML IJ SOLN
4.0000 mg | Freq: Once | INTRAMUSCULAR | Status: AC
  Administered 2012-09-06: 4 mg via INTRAVENOUS
  Filled 2012-09-06: qty 2

## 2012-09-06 MED ORDER — OXYCODONE-ACETAMINOPHEN 5-325 MG PO TABS: 1.0000 | ORAL_TABLET | Freq: Four times a day (QID) | ORAL | Status: DC | PRN

## 2012-09-06 MED ORDER — SODIUM CHLORIDE 0.9 % IV BOLUS (SEPSIS)
1000.0000 mL | Freq: Once | INTRAVENOUS | Status: AC
  Administered 2012-09-06: 1000 mL via INTRAVENOUS

## 2012-09-06 MED ORDER — PROCHLORPERAZINE 25 MG RE SUPP: 25.0000 mg | Freq: Two times a day (BID) | RECTAL | Status: DC | PRN

## 2012-09-06 MED ORDER — MORPHINE SULFATE 4 MG/ML IJ SOLN
4.0000 mg | Freq: Once | INTRAMUSCULAR | Status: AC
  Administered 2012-09-06: 4 mg via INTRAVENOUS
  Filled 2012-09-06: qty 1

## 2012-09-06 MED ORDER — FENTANYL CITRATE 0.05 MG/ML IJ SOLN: 50.0000 ug | Freq: Once | INTRAMUSCULAR | Status: DC

## 2012-09-06 MED ORDER — ONDANSETRON HCL 4 MG PO TABS: 4.0000 mg | ORAL_TABLET | Freq: Four times a day (QID) | ORAL | Status: DC

## 2012-09-06 NOTE — ED Notes (Signed)
Patient  Ultrasound in progress. 

## 2012-09-06 NOTE — Telephone Encounter (Signed)
Called at 130 AM with 2 hours of vomiting - mostly that. No pain or similar sxs of pancreatitis.  No fever. + chills No diarrhea  She went on to [facility] ED because things did not stop  I have reviewed that  Mild lipase elevation main lab abnormality  Abd [place] w/o abnormality to explain problems  Notes indicate she was better after MSO4 and IV Zofran but she says that was not true upon callback to me now  I will rx compazine suppositories She will try sips of clears Will f/u later  No sick contacts - ? If she has a gastroenteritis or gastritis

## 2012-09-06 NOTE — ED Notes (Signed)
Pt is aware we need a urine sample. Pt is not able to urinate at this time

## 2012-09-06 NOTE — ED Provider Notes (Signed)
Medical screening examination/treatment/procedure(s) were performed by non-physician practitioner and as supervising physician I was immediately available for consultation/collaboration.  Chasya Zenz, MD 09/06/12 2306 

## 2012-09-06 NOTE — ED Notes (Signed)
C/o onset of nausea and vomiting tonight then upper abdominal pain. Pt voices history of pancreatitis

## 2012-09-06 NOTE — ED Provider Notes (Signed)
History     CSN: [id number]  Arrival date & time 09/06/12  0229   First MD Initiated Contact with Patient 09/06/12 0257      Chief Complaint  Patient presents with  . Abdominal Pain    (Consider location/radiation/quality/duration/timing/severity/associated sxs/prior treatment) HPI  Patient presents to the emergency department with complaints of nausea, vomiting, epigastric pain that started this morning but became acutely worse this evening. She has a long significant history of pancreatitis for which she has been hospitalized for and she was concerned that this might be some of the same. Her pancreatitis is due to  She denies having fevers, chest pain, diarrhea, myalgias, cough, nasal congestion or sore throat. She is a patient of [facility].   Past Medical History  Diagnosis Date  . GERD (gastroesophageal reflux disease)   . Anxiety   . Migraine headache   . PONV (postoperative nausea and vomiting)   . Pneumonia     02/2011  . Pancreatitis   . Duodenal stenosis   . DVT (deep venous thrombosis)   . Duodenitis   . Depression     Past Surgical History  Procedure Date  . Appendectomy   . Cholecystectomy   . Ercp 01/23/2012    Procedure: ENDOSCOPIC RETROGRADE CHOLANGIOPANCREATOGRAPHY (ERCP);  Surgeon: Louis Meckel, MD;  Location: [facility];  Service: Endoscopy;  Laterality: N/A;  . Sphincterotomy 01/23/2012    Procedure: SPHINCTEROTOMY;  Surgeon: Louis Meckel, MD;  Location: [facility];  Service: Endoscopy;  Laterality: N/A;  . Esophagogastroduodenoscopy 01/25/2012    Procedure: ESOPHAGOGASTRODUODENOSCOPY (EGD);  Surgeon: Rachael Fee, MD;  Location: [facility];  Service: Endoscopy;  Laterality: N/A;  . Esophagogastroduodenoscopy 02/20/2012    Procedure: ESOPHAGOGASTRODUODENOSCOPY (EGD);  Surgeon: Meryl Dare, MD,FACG;  Location: [facility];  Service: Endoscopy;  Laterality: N/A;  ?feeding tube placement at same time post pyloric tube    Family History    Problem Relation Age of Onset  . Colon cancer Cousin     Paternal side  . Colon cancer Paternal Grandfather   . Lung cancer Mother   . Diabetes Father   . Heart disease Father   . Prostate cancer Paternal Grandfather   . Breast cancer Maternal Aunt     History  Substance Use Topics  . Smoking status: Former Smoker    Types: Cigarettes    Quit date: 08/12/1988  . Smokeless tobacco: Never Used  . Alcohol Use: Yes     Comment: Occasional    OB History    Grav Para Term Preterm Abortions TAB SAB Ect Mult Living                  Review of Systems  Review of Systems  Gen: no weight loss, fevers, chills, night sweats  Eyes: no discharge or drainage, no occular pain or visual changes  Nose: no epistaxis or rhinorrhea  Mouth: no dental pain, no sore throat  Neck: no neck pain  Lungs:No wheezing, coughing or hemoptysis CV: no chest pain, palpitations, dependent edema or orthopnea  Abd: no abdominal pain, nausea, vomiting  GU: no dysuria or gross hematuria  MSK:  + abdominal pain, nausea and vomiting. Neuro: no headache, no focal neurologic deficits  Skin: no abnormalities Psyche: negative.   Allergies  Macrodantin; Dilaudid; and Nitrofurantoin  Home Medications   Current Outpatient Rx  Name  Route  Sig  Dispense  Refill  . BUPROPION HCL ER (XL) 300 MG PO TB24   Oral  Take 300 mg by mouth every morning.          . CYCLOBENZAPRINE HCL 10 MG PO TABS   Oral   Take 10 mg by mouth 3 (three) times daily as needed. For muscle spasms         . MORPHINE SULFATE 15 MG PO TABS   Oral   Take 7.5 mg by mouth every 4 (four) hours as needed.         Marland Kitchen PROMETHAZINE HCL 25 MG PO TABS   Oral   Take 25 mg by mouth every 6 (six) hours as needed. For nausea         . WARFARIN SODIUM 5 MG PO TABS   Oral   Take 5-10 mg by mouth daily. Take 5mg  every day except Wednesday. Take 10mg  on Wednesday.         . SULFAMETHOXAZOLE-TMP DS 800-160 MG PO TABS   Oral   Take 1  tablet by mouth 2 (two) times daily. For 10 days           BP 141/87  Pulse 122  Temp 99.9 F (37.7 C) (Oral)  Resp 22  SpO2 100%  Physical Exam  Nursing note and vitals reviewed. Constitutional: She appears well-developed and well-nourished. No distress.  HENT:  Head: Normocephalic and atraumatic.  Eyes: Pupils are equal, round, and reactive to light.  Neck: Normal range of motion. Neck supple.  Cardiovascular: Normal rate and regular rhythm.   Pulmonary/Chest: Effort normal.  Abdominal: Soft. She exhibits no distension. There is tenderness (epigastric pain). There is no rebound and no guarding.  Neurological: She is alert.  Skin: Skin is warm and dry.    ED Course  Procedures (including critical care time)  Labs Reviewed  CBC WITH DIFFERENTIAL - Abnormal; Notable for the following:    Neutrophils Relative 90 (*)     Neutro Abs 8.8 (*)     Lymphocytes Relative 5 (*)     Lymphs Abs 0.5 (*)     All other components within normal limits  COMPREHENSIVE METABOLIC PANEL  LIPASE, BLOOD  URINALYSIS, ROUTINE W REFLEX MICROSCOPIC   No results found.   No diagnosis found.    MDM  Patient was given 4 mg IV Morphine, Zofran 4 mg. Patients pain has improved significantly. She has not had anymore episodes of vomiting while in the ED. Because of her significant PMH of pancreatitis and pain that she had on arrival I will obtain US of abdomen to r/o any acute causes of pain. PT feels that she may have the "stomach bug".   Ultrasound of abdomen pending. End of shift care handed off to Fayrene Helper, PA-C.          Dorthula Matas, PA 09/06/12 0610  Dorthula Matas, PA 09/06/12 [id number]

## 2012-09-07 ENCOUNTER — Telehealth: Payer: Self-pay | Admitting: Internal Medicine

## 2012-09-07 ENCOUNTER — Ambulatory Visit: Payer: Managed Care, Other (non HMO) | Admitting: Internal Medicine

## 2012-09-07 NOTE — Telephone Encounter (Signed)
Called back about 430 PM 1/26 and she had been ok, slept after prochlorperazine suppository.

## 2012-09-14 NOTE — Telephone Encounter (Signed)
No charge per Dr. Perry 

## 2012-09-23 ENCOUNTER — Telehealth: Payer: Self-pay | Admitting: Internal Medicine

## 2012-09-23 NOTE — Telephone Encounter (Signed)
Spoke with pt and just reviewed appt date and time.

## 2012-09-30 ENCOUNTER — Ambulatory Visit (INDEPENDENT_AMBULATORY_CARE_PROVIDER_SITE_OTHER): Payer: BC Managed Care – PPO | Admitting: Internal Medicine

## 2012-09-30 ENCOUNTER — Encounter: Payer: Self-pay | Admitting: Internal Medicine

## 2012-09-30 VITALS — BP 128/64 | HR 72 | Ht 64.0 in | Wt 173.0 lb

## 2012-09-30 DIAGNOSIS — R933 Abnormal findings on diagnostic imaging of other parts of digestive tract: Secondary | ICD-10-CM

## 2012-09-30 DIAGNOSIS — R110 Nausea: Secondary | ICD-10-CM

## 2012-09-30 DIAGNOSIS — R1013 Epigastric pain: Secondary | ICD-10-CM

## 2012-09-30 MED ORDER — MORPHINE SULFATE 15 MG PO TABS: 7.5000 mg | ORAL_TABLET | ORAL | Status: DC | PRN

## 2012-09-30 MED ORDER — ESOMEPRAZOLE MAGNESIUM 40 MG PO CPDR: 40.0000 mg | DELAYED_RELEASE_CAPSULE | Freq: Every day | ORAL | Status: DC

## 2012-09-30 MED ORDER — CYCLOBENZAPRINE HCL 10 MG PO TABS: 10.0000 mg | ORAL_TABLET | Freq: Three times a day (TID) | ORAL | Status: DC | PRN

## 2012-09-30 NOTE — Patient Instructions (Addendum)
We have sent the following medications to your pharmacy for you to pick up at your convenience: Flexeril.  We have given you a handwritten prescription for Morphine to take to your pharmacy  I have given you some samples of Nexium to take - one tablet 30 minutes before breakfast.

## 2012-09-30 NOTE — Progress Notes (Signed)
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 Otherwise all the patient intermittently during her hospitalizations, she presented to me for the first time as an outpatient to establish care 08/06/2012. See that dictation. About 4 weeks later she had an episode of pain, likely a flare of her pancreatitis, brought her to the emergency room. She did not require hospitalization. Since that time, she has had some intermittent discomfort for which she takes 7.5 mg of sustained release morphine about 3 times per week. As well, she uses Phenergan intermittently for nausea. Her last episode of pain was about 5 days ago. None since. Her appetite and weight are stable. Energy levels and overall sense of well-being have improved. No new complaints.  REVIEW OF SYSTEMS:  All non-GI ROS negative except for anxiety, fatigue, sleeping problems  Past Medical History  Diagnosis Date  . GERD (gastroesophageal reflux disease)   . Anxiety   . Migraine headache   . PONV (postoperative nausea and vomiting)   . Pneumonia     02/2011  . Pancreatitis   . Duodenal stenosis   . DVT (deep venous thrombosis)   . Duodenitis   . Depression     Past Surgical History  Procedure Laterality Date  . Appendectomy    . Cholecystectomy    . Ercp  01/23/2012    Procedure: ENDOSCOPIC RETROGRADE CHOLANGIOPANCREATOGRAPHY (ERCP);  Surgeon: Louis Meckel, MD;  Location: [facility];  Service: Endoscopy;  Laterality: N/A;  . Sphincterotomy  01/23/2012    Procedure: SPHINCTEROTOMY;  Surgeon: Louis Meckel, MD;  Location: [facility];  Service: Endoscopy;  Laterality: N/A;  . Esophagogastroduodenoscopy  01/25/2012    Procedure: ESOPHAGOGASTRODUODENOSCOPY (EGD);  Surgeon: Rachael Fee, MD;  Location: [facility];  Service: Endoscopy;  Laterality:  N/A;  . Esophagogastroduodenoscopy  02/20/2012    Procedure: ESOPHAGOGASTRODUODENOSCOPY (EGD);  Surgeon: Meryl Dare, MD,FACG;  Location: [facility];  Service: Endoscopy;  Laterality: N/A;  ?feeding tube placement at same time post pyloric tube    Social History Cassie Berry  reports that she quit smoking about 24 years ago. Her smoking use included Cigarettes. She smoked 0.00 packs per day. She has never used smokeless tobacco. She reports that  drinks alcohol. She reports that she does not use illicit drugs.  family history includes Breast cancer in her maternal aunt; Colon cancer in her cousin and paternal grandfather; Diabetes in her father; Heart disease in her father; Lung cancer in her mother; and Prostate cancer in her paternal grandfather.  Allergies  Allergen Reactions  . Macrodantin Rash  . Dilaudid (Hydromorphone Hcl) Other (See Comments)    Hallucinations   . Nitrofurantoin Rash       PHYSICAL EXAMINATION: Vital signs: BP 128/64  Pulse 72  Ht 5\' 4"  (1.626 m)  Wt 173 lb (78.472 kg)  BMI 29.68 kg/m2 General: Well-developed, well-nourished, no acute distress HEENT: Sclerae are anicteric, conjunctiva pink. Oral mucosa intact Lungs: Clear Heart: Regular Abdomen: soft, nontender, nondistended, no obvious ascites, no peritoneal signs, normal bowel sounds. No organomegaly. Extremities: No edema Psychiatric: alert and oriented x3. Cooperative   ASSESSMENT:  #1. Severe post ERCP June 2013 #2. 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 Ongoing intermittent problems with pain and nausea secondary to pancreatitis #4. 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 On chronic Coumadin therapy   PLAN:  #1. Discussion today regarding her gradual, but positive progress. #2. Continue antiemetics and pain medication as needed #3. Continue low-fat diet and increase activity as  tolerated #4. Possible therapeutic endoscopy for pancreatic stricture should she develop significant problems with relapse. Previously discussed #5. Routine followup in 2 months. Contact the office in the interim for any questions or problems.

## 2012-10-21 ENCOUNTER — Telehealth: Payer: Self-pay | Admitting: Internal Medicine

## 2012-10-21 NOTE — Telephone Encounter (Signed)
Pt states she has been having abdominal pain for about a week, thinks it may be her pancreatitis. She has been taking her pain meds and states it has been under control. The pain is not as bad as it has been in the past but the pt states it is located under her ribs on the left side and is radiating through to her back. States she does have some nausea and that is hurts when she sits down but she is not "doubled over." Pt rates the pain at a 3 on a scale of 1-10. Dr. Marina Goodell please advise.

## 2012-10-21 NOTE — Telephone Encounter (Signed)
And sounds like a mild flare. Make sure she has a medication, and it helps adequately. As well, make sure she has an antiemetic that is effective. Back off on her diet to low-fat liquids for a few days. If she becomes unable to need, develops vomiting, or uncontrolled pain, then she needs to let us know and go to the ER. If she remains stable, have her give [place] an update on Monday. Thanks

## 2012-10-21 NOTE — Telephone Encounter (Signed)
Spoke with pt and she is aware. States she has enough pain medication and she is taking phenergan for the nausea. Pt knows to call us and go to the ER if symptoms get worse as described below. If stable she knows to give an update on Monday.

## 2012-10-30 ENCOUNTER — Other Ambulatory Visit: Payer: Self-pay | Admitting: Physician Assistant

## 2012-11-03 NOTE — Telephone Encounter (Signed)
Amy Esterwood PA-C prescribed the Promethazine the last time. This is a Dr. Marina Goodell pt. Asking for him to refill the medication.

## 2012-11-06 ENCOUNTER — Encounter: Payer: Self-pay | Admitting: Pharmacist

## 2012-11-10 ENCOUNTER — Telehealth: Payer: Self-pay

## 2012-11-10 ENCOUNTER — Telehealth: Payer: Self-pay | Admitting: Internal Medicine

## 2012-11-10 MED ORDER — MORPHINE SULFATE 15 MG PO TABS: 7.5000 mg | ORAL_TABLET | ORAL | Status: DC | PRN

## 2012-11-10 NOTE — Telephone Encounter (Signed)
Message copied by Jeanine Luz on Tue Nov 10, 2012 10:12 AM ------      Message from: Hilarie Fredrickson      Created: Tue Nov 10, 2012  9:13 AM       Okay to refill      ----- Message -----         From: Jeanine Luz, CMA         Sent: 11/10/2012   8:29 AM           To: Hilarie Fredrickson, MD            Patient requesting refill of Morphine.  Last refill at 09/30/12 o/v - #30.  Please advise       ------

## 2012-11-10 NOTE — Telephone Encounter (Signed)
Refilled Morphine per Dr. Marina Goodell.  Called patient to let her know to come pick it up

## 2012-12-08 ENCOUNTER — Ambulatory Visit: Payer: BC Managed Care – PPO | Admitting: Internal Medicine

## 2012-12-08 ENCOUNTER — Other Ambulatory Visit (INDEPENDENT_AMBULATORY_CARE_PROVIDER_SITE_OTHER): Payer: BC Managed Care – PPO

## 2012-12-08 ENCOUNTER — Ambulatory Visit (INDEPENDENT_AMBULATORY_CARE_PROVIDER_SITE_OTHER): Payer: BC Managed Care – PPO | Admitting: Physician Assistant

## 2012-12-08 ENCOUNTER — Encounter: Payer: Self-pay | Admitting: Physician Assistant

## 2012-12-08 VITALS — BP 102/60 | HR 80 | Ht 64.57 in | Wt 180.5 lb

## 2012-12-08 DIAGNOSIS — K859 Acute pancreatitis without necrosis or infection, unspecified: Secondary | ICD-10-CM

## 2012-12-08 LAB — COMPREHENSIVE METABOLIC PANEL
AST: 19 U/L (ref 0–37)
Albumin: 4.3 g/dL (ref 3.5–5.2)
Alkaline Phosphatase: 87 U/L (ref 39–117)
BUN: 16 mg/dL (ref 6–23)
Potassium: 4.3 mEq/L (ref 3.5–5.1)
Total Bilirubin: 0.4 mg/dL (ref 0.3–1.2)

## 2012-12-08 MED ORDER — ESOMEPRAZOLE MAGNESIUM 40 MG PO CPDR: 40.0000 mg | DELAYED_RELEASE_CAPSULE | Freq: Every day | ORAL | Status: DC

## 2012-12-08 NOTE — Progress Notes (Signed)
Subjective:    Patient ID: Cassie Berry, female    DOB: [birth year], 47 y.o.   MRN: [id number]  HPI Vicci is a very nice 47 year old white female known to Dr. Marina Goodell with history of severe post ERCP pancreatitis with prolonged hospitalization summer 2013 Her course was complicated by a right upper treatment he DVT from a PICC line and she is now on chronic Coumadin. She also had a significant duodenal stenosis and required tube  feedings for a couple of months around that time. Last EGD was done in July of 2013. She has had intermittent bouts of abdominal pain and nausea since that time and has been found on MRCP done November 2013 to have a distal pancreatic duct stricture over and 11 mm segment leading up to the ampulla with a beaded appearance optimal to the stricture the pancreatic duct is mildly dilated to 3-4 mm there is no common bile duct dilation or filling defect. She has been managed with intermittent anti-emetics and analgesics and had one overnight hospital stay. She comes in today for followup stating that she definitely feels overall that she's been doing much better over the past 3-4 months area she did have one episode of pain consistent with pancreatitis about 3 weeks ago and was able to manage that at home with pain over a 3 to four-day period. She says she's having longer stretches of time that she feels well and shorter and less intense episodes of abdominal pain. She says she still has a hard time eating and that primarily she is still subsisting on juicing  and smoothies and consumes very small amounts of solid food. She says she made an 8 for breakfast or very small bowl of cereal. She says she does not do well with meat particularly that if she needs any heavy or meals she gets abdominal pain and some nausea after eating. She also has occasional episodes of bloating and abdominal distention in her upper abdomen. Her weight is up about 30 pounds from last summer. She continues to have  intermittent reflux symptoms and feels that Nexium works best for her.    Review of Systems  Constitutional: Negative.   HENT: Negative.   Eyes: Negative.   Respiratory: Negative.   Cardiovascular: Negative.   Gastrointestinal: Positive for nausea and abdominal pain.  Endocrine: Negative.   Genitourinary: Negative.   Musculoskeletal: Negative.   Skin: Negative.   Allergic/Immunologic: Negative.   Neurological: Negative.   Hematological: Negative.   Psychiatric/Behavioral: Negative.    Outpatient Prescriptions Prior to Visit  Medication Sig Dispense Refill  . buPROPion (WELLBUTRIN XL) 300 MG 24 hr tablet Take 300 mg by mouth every morning.       . cyclobenzaprine (FLEXERIL) 10 MG tablet Take 1 tablet (10 mg total) by mouth 3 (three) times daily as needed. For muscle spasms  30 tablet  2  . morphine (MSIR) 15 MG tablet Take 0.5 tablets (7.5 mg total) by mouth every 4 (four) hours as needed.  30 tablet  0  . promethazine (PHENERGAN) 25 MG tablet TAKE 1 TABLET BY MOUTH EVERY 6 HOURS AS NEEDED FOR NAUSEA  30 tablet  1  . warfarin (COUMADIN) 5 MG tablet Take 5-10 mg by mouth daily. Take 5mg  every day except Wednesday. Take 10mg  on Wednesday.      . esomeprazole (NEXIUM) 40 MG capsule Take 1 capsule (40 mg total) by mouth daily before breakfast.  25 capsule  0   No facility-administered medications prior to visit.  Allergies  Allergen Reactions  . Macrodantin Rash  . Dilaudid (Hydromorphone Hcl) Other (See Comments)    Hallucinations   . Nitrofurantoin Rash   Patient Active Problem List   Diagnosis Date Noted  . Pancreatic duct stricture 07/31/2012  . Encounter for long-term (current) use of anticoagulants 03/06/2012  . Nausea with vomiting 02/18/2012  . Deep vein thrombosis, upper right extremity 02/12/2012  . Feeding difficulties 02/11/2012  . Nonspecific (abnormal) findings on radiological and other examination of gastrointestinal tract 02/11/2012  . Anemia 02/01/2012  .  Bilateral pleural effusion 01/30/2012  . Fever 01/30/2012  . Hyponatremia 01/30/2012  . Acute pancreatitis 01/23/2012  . Right hip pain 11/28/2010  . Greater trochanteric bursitis 11/28/2010  . Leg length inequality 11/28/2010  . ABDOMINAL PAIN-RUQ 09/05/2009  . ABDOMINAL PAIN, EPIGASTRIC 08/21/2009   History  Substance Use Topics  . Smoking status: Former Smoker    Types: Cigarettes    Quit date: 08/12/1988  . Smokeless tobacco: Never Used  . Alcohol Use: Yes     Comment: Occasional   family history includes Breast cancer in her maternal aunt; Colon cancer in her cousin and paternal grandfather; Diabetes in her father; Heart disease in her father; Lung cancer in her mother; and Prostate cancer in her paternal grandfather.     Objective:   Physical Exam Well-developed white female in no acute distress, pleasant blood pressure 102/60 pulse 80 height 5 foot 4 weight 180. HEENT ;nontraumatic normocephalic EOMI PERRLA sclera anicteric, Neck; supple no JVD, Cardiovascular; regular rate and rhythm with S1-S2 no murmur or gallop, Pulmonary; clear bilaterally, Abdomen; soft mildly tender in the epigastrium and upper abdomen there is no guarding or rebound no palpable mass or hepatosplenomegaly bowel sounds are present, Rectal ;exam not done, Extremities; no clubbing cyanosis or edema skin warm and dry, Psych; mood and affect normal and appropriate.       Assessment & Plan:  #9 47 year old female status post severe post ERCP pancreatitis summer 2013 #2 distal pancreatic duct stricture documented on MRCP November 2013 #3 persistent intermittent abdominal pain and nausea probably secondary to milder episodes of pancreatitis. Also wonder if she may not have some persistent duodenal narrowing accounting for her inability to eat larger amounts of food and digest heavier foods such as meat. #4 GERD #5 right upper extremity DVT-summer 2013 on chronic Coumadin  Plan; CMET and lipase today Nexium  40 mg by mouth every morning samples and prescription given She will continue when necessary MSIR and Phenergan for exacerbations of pain and nausea . Have  discussed possible endoscopy to assess for duodenal stenosis/stricture but she would like to hold off on that for now. She will followup with Dr. Marina Goodell in 2 months

## 2012-12-08 NOTE — Progress Notes (Signed)
Patient was a little late for her appointment. Able to be seen by Amy. Agree with assessment and plans. Routine followup in a few months.

## 2012-12-08 NOTE — Patient Instructions (Addendum)
Please go to the basement level to have your labs drawn.  We sent a prescription to [facility], [place] for Nexium. Samples provided also.  We made you a follow up appointment with Dr. Marina Goodell for 02-08-2013 at 10:45 Am.

## 2012-12-22 ENCOUNTER — Other Ambulatory Visit: Payer: Self-pay | Admitting: *Deleted

## 2012-12-22 ENCOUNTER — Telehealth: Payer: Self-pay | Admitting: Physician Assistant

## 2012-12-22 MED ORDER — ESOMEPRAZOLE MAGNESIUM 40 MG PO CPDR: 40.0000 mg | DELAYED_RELEASE_CAPSULE | Freq: Every day | ORAL | Status: DC

## 2012-12-22 NOTE — Telephone Encounter (Signed)
I called the patient and she said Sat PM she started hurting again and bloating also.  It wasn't as bad as the last time .  It was above the navel a little to the left.  She said the Nexium does help.

## 2012-12-22 NOTE — Telephone Encounter (Signed)
OK - nocahnge- just be sure she has follow up with Dr. Marina Goodell scheduled

## 2012-12-24 NOTE — Telephone Encounter (Signed)
The patient does have an appointment with Dr. Yancey Flemings on 02-08-2013.

## 2013-02-05 ENCOUNTER — Telehealth: Payer: Self-pay | Admitting: Internal Medicine

## 2013-02-05 ENCOUNTER — Other Ambulatory Visit: Payer: Self-pay | Admitting: Physician Assistant

## 2013-02-05 MED ORDER — MORPHINE SULFATE 15 MG PO TABS: 7.5000 mg | ORAL_TABLET | ORAL | Status: DC | PRN

## 2013-02-05 NOTE — Telephone Encounter (Signed)
Patient states she has had pain off and on for the last couple weeks. Taking 1/2 pain pill or Tylenol had been helping. Last night she had pain on left side under her ribs that woke her up around 3 AM. She took Morphine for pain and went back to sleep. Today her pain is in the center of her abdomen. She is having more nausea today. She took Zofran but still vomited x 1. She only has two Morphine pills left and she is worried she will need more over the weekend. She is asking for an rx. She has an OV scheduled on Monday with Dr. Marina Goodell. She states she understands if she can only get a partial rx for pain pills. She just does not want to end up in the ED this weekend. Please,advise.

## 2013-02-05 NOTE — Telephone Encounter (Signed)
Rx up front for pick up. Patient notified. 

## 2013-02-05 NOTE — Telephone Encounter (Signed)
Refill her morphine please.

## 2013-02-08 ENCOUNTER — Encounter: Payer: Self-pay | Admitting: Internal Medicine

## 2013-02-08 ENCOUNTER — Ambulatory Visit (INDEPENDENT_AMBULATORY_CARE_PROVIDER_SITE_OTHER): Payer: BC Managed Care – PPO | Admitting: Internal Medicine

## 2013-02-08 VITALS — BP 102/62 | HR 72 | Ht 64.0 in | Wt 187.2 lb

## 2013-02-08 DIAGNOSIS — K5900 Constipation, unspecified: Secondary | ICD-10-CM

## 2013-02-08 DIAGNOSIS — K219 Gastro-esophageal reflux disease without esophagitis: Secondary | ICD-10-CM

## 2013-02-08 DIAGNOSIS — R635 Abnormal weight gain: Secondary | ICD-10-CM

## 2013-02-08 DIAGNOSIS — R1013 Epigastric pain: Secondary | ICD-10-CM

## 2013-02-08 DIAGNOSIS — K859 Acute pancreatitis without necrosis or infection, unspecified: Secondary | ICD-10-CM

## 2013-02-08 MED ORDER — PROMETHAZINE HCL 25 MG PO TABS: 25.0000 mg | ORAL_TABLET | Freq: Four times a day (QID) | ORAL | Status: DC | PRN

## 2013-02-08 NOTE — Patient Instructions (Addendum)
Please follow up with Dr. Marina Goodell in 3 months  We have sent the following medications to your pharmacy for you to pick up at your convenience:  Phenergan

## 2013-02-08 NOTE — Progress Notes (Signed)
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 Previous patient of Dr. Juanda Chance. I assumed her care, at her request, December 2013. She presents today for routine followup. Last seen 12/08/2012. Over the past 3 months she states that her abdominal discomfort occurs with the same frequency and duration. Generally last 3-4 days. However, the intensity is not as severe. She is able to manage discomfort with as needed MSIR 7.5 mg. As previously prescribed MSIR 15 mg, #30, which lasted 3 months. She had a medication refill last week. Last episode of pain about 8 days ago. Her chief complaint today is that of ongoing weight gain. She feels like current burns and muscle to have improved, though nowhere near baseline. Her other complaint today is constipation which is unusual for her. Finally, she mentions that she is file for Social Security/disability.  REVIEW OF SYSTEMS:  All non-GI ROS negative except for insomnia  Past Medical History  Diagnosis Date  . GERD (gastroesophageal reflux disease)   . Anxiety   . Migraine headache   . PONV (postoperative nausea and vomiting)   . Pneumonia     02/2011  . Pancreatitis   . Duodenal stenosis   . DVT (deep venous thrombosis)   . Duodenitis   . Depression     Past Surgical History  Procedure Laterality Date  . Appendectomy    . Cholecystectomy    . Ercp  01/23/2012    Procedure: ENDOSCOPIC RETROGRADE CHOLANGIOPANCREATOGRAPHY (ERCP);  Surgeon: Louis Meckel, MD;  Location: [facility];  Service: Endoscopy;  Laterality: N/A;  . Sphincterotomy  01/23/2012    Procedure: SPHINCTEROTOMY;  Surgeon: Louis Meckel, MD;  Location: [facility];  Service: Endoscopy;  Laterality: N/A;  . Esophagogastroduodenoscopy  01/25/2012    Procedure: ESOPHAGOGASTRODUODENOSCOPY (EGD);  Surgeon:  Rachael Fee, MD;  Location: [facility];  Service: Endoscopy;  Laterality: N/A;  . Esophagogastroduodenoscopy  02/20/2012    Procedure: ESOPHAGOGASTRODUODENOSCOPY (EGD);  Surgeon: Meryl Dare, MD,FACG;  Location: [facility];  Service: Endoscopy;  Laterality: N/A;  ?feeding tube placement at same time post pyloric tube    Social History JACLYNN LAUMANN  reports that she quit smoking about 24 years ago. Her smoking use included Cigarettes. She smoked 0.00 packs per day. She has never used smokeless tobacco. She reports that  drinks alcohol. She reports that she does not use illicit drugs.  family history includes Breast cancer in her maternal aunt; Colon cancer in her cousin and paternal grandfather; Diabetes in her father; Heart disease in her father; Lung cancer in her mother; and Prostate cancer in her paternal grandfather.  Allergies  Allergen Reactions  . Macrodantin Rash  . Dilaudid (Hydromorphone Hcl) Other (See Comments)    Hallucinations   . Nitrofurantoin Rash       PHYSICAL EXAMINATION: Vital signs: BP 102/62  Pulse 72  Ht 5\' 4"  (1.626 m)  Wt 187 lb 4 oz (84.936 kg)  BMI 32.13 kg/m2 General: Well-developed, well-nourished, no acute distress HEENT: Sclerae are anicteric, conjunctiva pink. Oral mucosa intact Lungs: Clear Heart: Regular Abdomen: soft, mild epigastric tenderness, nondistended, no obvious ascites, no peritoneal signs, normal bowel sounds. No organomegaly. Extremities: No edema Psychiatric: alert and oriented x3. Cooperative   ASSESSMENT:  #1. History of severe post ERCP pancreatitis June 2013. Ongoing intermittent abdominal discomfort as described.  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 GERD #3. 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 Previously on Coumadin. Off Coumadin currently #4. Constipation   PLAN:  #1. Continue when necessary pain medicine. MSIR recently refilled #2.  If condition worsens, reevaluate with CT #3. Continue PPI #4. MiraLax as needed for constipation #5. Graduated exercise program recommended #6. Routine GI office followup in 3 months

## 2013-03-17 ENCOUNTER — Telehealth: Payer: Self-pay | Admitting: Internal Medicine

## 2013-03-17 NOTE — Telephone Encounter (Signed)
Left message for Pat to call back.

## 2013-03-17 NOTE — Telephone Encounter (Signed)
Pt having a "different type of abdominal pain" and Dr. Renne Crigler would like the pt seen sooner than the first available. Pt scheduled to see [name] NP tomorrow at 2pm. Dennie Bible to notify pt of appt date and time.

## 2013-03-18 ENCOUNTER — Ambulatory Visit (INDEPENDENT_AMBULATORY_CARE_PROVIDER_SITE_OTHER): Payer: BC Managed Care – PPO | Admitting: Nurse Practitioner

## 2013-03-18 ENCOUNTER — Encounter: Payer: Self-pay | Admitting: *Deleted

## 2013-03-18 VITALS — BP 128/74 | HR 84 | Ht 64.0 in | Wt 189.0 lb

## 2013-03-18 DIAGNOSIS — R198 Other specified symptoms and signs involving the digestive system and abdomen: Secondary | ICD-10-CM

## 2013-03-18 DIAGNOSIS — R109 Unspecified abdominal pain: Secondary | ICD-10-CM

## 2013-03-18 DIAGNOSIS — R194 Change in bowel habit: Secondary | ICD-10-CM

## 2013-03-18 DIAGNOSIS — K219 Gastro-esophageal reflux disease without esophagitis: Secondary | ICD-10-CM

## 2013-03-18 MED ORDER — DICYCLOMINE HCL 10 MG PO CAPS: ORAL_CAPSULE | ORAL | Status: DC

## 2013-03-18 NOTE — Progress Notes (Addendum)
  History of Present Illness:   Cassie Berry is a 47 year old female with a history of GERD and a history of severe post ERCP pancreatitis June 2013.  MRCP November 2013 revealed a distal pancreatic duct stricture. She is followed by Dr. Marina Goodell who saw her last 02/08/13 for evaluation of abdominal discomfort. Raquell has chronic intermittent upper abdominal pain remininsent of pancreatitis but not nearly as bad as when hospitalized with acute pancreatitis. She takes Morphine as needed which isn't on a daily basis. She has been trying to use even less Morphine, her last dose was five days ago. Cassie Berry has chronic nausea manageable with phenergan but over last several days nausea has been worse. She is belching and having more heartburn than usual.   Cassie Berry is worked in today for a two week history of diffuse abdominal cramps / bowel changes. During, or shortly after meals she develops diffuse cramps and an urgent need to defecate. Stools are unformed but not loose and cramps are relieved with defecation. Overall these episodes are occuring 2-3 times a day. No recent antibitocs. No medication changes other than addition of Ativan and reduction in Morphine use. In addition to worsening nausea she complains of increased belching heartburn despite daily Nexium. She eats small frequent meals throughtout the day.    Current Medications, Allergies, Past Medical History, Past Surgical History, Family History and Social History were reviewed in [employer] record.  Physical Exam: General: Well developed , white female in no acute distress Head: Normocephalic and atraumatic Eyes:  sclerae anicteric, conjunctiva pink  Ears: Normal auditory acuity Lungs: Clear throughout to auscultation Heart: Regular rate and rhythm Abdomen: Soft, non distended, non-tender. No masses, no hepatomegaly. Normal bowel sounds Rectal: no stool in vault Musculoskeletal: Symmetrical with no gross deformities  Extremities: No  edema  Neurological: Alert oriented x 4, grossly nonfocal Psychological:  Alert and cooperative. Normal mood and affect  Assessment and Recommendations:  1. Two week history of postprandial diffuse abdominal cramps associated with bowel urgency. Stool unformed but not loose and no more than 2-3 episodes a day. Etiology not clear. Doesn't sound infectious. Query pancreatic insufficieny but that wouldn't explain the cramps. Using Morphine less frequently, ? component of withdrawal. Will check a spot fecal fat. Trial of Bentyl BID for cramps. Paitent will follow up with Dr. Marina Goodell but we will call her in a few days for a condition update.  2. Pyrosis. She takes Nexium before breakfast. Recommended addition of Zantac OTC before dinner.   3. chronic nausea, worse over last several days. Continue Phenergan as needed. Patient does have a history of duodenal stenosis as seen on EGD July 2013. Continue small frequent meals  4. History of severe post ERCP pancreatitis. MRCP November of last year revealed distal pancreatic duct stricture. Patient continues to have intermittent upper abdominal pain manageable with morphine. At this point her chronic intermittent upper abdominal pain is unchanged in frequency / intensity.    Addendum 03/23/13 Received serum lipase drawn by PCP a couple of days prior to our visit. Lipase was 108.

## 2013-03-18 NOTE — Patient Instructions (Signed)
We sent the prescription for Bentyl ( Dicyclomine )  To  [facility] [place].

## 2013-03-19 DIAGNOSIS — R109 Unspecified abdominal pain: Secondary | ICD-10-CM | POA: Insufficient documentation

## 2013-03-19 DIAGNOSIS — K219 Gastro-esophageal reflux disease without esophagitis: Secondary | ICD-10-CM | POA: Insufficient documentation

## 2013-03-19 DIAGNOSIS — R194 Change in bowel habit: Secondary | ICD-10-CM | POA: Insufficient documentation

## 2013-03-19 NOTE — Progress Notes (Signed)
i agree with the plan outlined in this note 

## 2013-03-22 ENCOUNTER — Telehealth: Payer: Self-pay | Admitting: *Deleted

## 2013-03-22 DIAGNOSIS — R109 Unspecified abdominal pain: Secondary | ICD-10-CM

## 2013-03-22 NOTE — Telephone Encounter (Signed)
Message copied by Daphine Deutscher on Mon Mar 22, 2013 11:24 AM ------      Message from: Meredith Pel      Created: Sat Mar 20, 2013 12:31 PM       Rene Kocher,      Please call and see how Cassie Berry is feeling? Did bentyl? How are bowels? If bowels still irregular then please have her come in for qual fecal fat stool test. Thanks ------

## 2013-03-22 NOTE — Telephone Encounter (Signed)
Spoke with patient and she states she did ok over the weekend. States she took 1/2 tab of Morphine for cramping and was better on Saturday and Sunday. Today, she has had 3 soft stools, cramping and feels like she has a lot of acid.  She has eaten toast and coffee. She did not take the Bentyl this weekend. She is going to take one today and see how that does. Do you want her to do the qual fecal fat stool test?

## 2013-03-22 NOTE — Telephone Encounter (Signed)
Cassie Berry,  I would like her to try Bentyl for cramps. And yes, please have her come for a fecal fat studies.  If she didn't add in Zantac before dinner, please ask her do so. Thanks

## 2013-03-23 MED ORDER — OMEPRAZOLE 40 MG PO CPDR: 40.0000 mg | DELAYED_RELEASE_CAPSULE | Freq: Every day | ORAL | Status: DC

## 2013-03-23 NOTE — Telephone Encounter (Signed)
Per [name], NP may change to Omeprazole 40 mg daily before breakfast. Rx sent to pharmacy.

## 2013-03-23 NOTE — Telephone Encounter (Signed)
Spoke with patient and the Bentyl helped the cramping. She will come for fecal fat studies. She had added the Zantac already. Last night she woke up with acid reflux in her throat. She states the Nexium copay is $100. She is asking to try Omeprazole instead since it will be less expensive. Please, advise.

## 2013-03-23 NOTE — Telephone Encounter (Signed)
Left a message for patient to call me. Labs in EPIC. 

## 2013-06-04 ENCOUNTER — Ambulatory Visit (INDEPENDENT_AMBULATORY_CARE_PROVIDER_SITE_OTHER): Payer: BC Managed Care – PPO | Admitting: Physician Assistant

## 2013-06-04 ENCOUNTER — Encounter: Payer: Self-pay | Admitting: Physician Assistant

## 2013-06-04 ENCOUNTER — Telehealth: Payer: Self-pay | Admitting: Internal Medicine

## 2013-06-04 ENCOUNTER — Other Ambulatory Visit (INDEPENDENT_AMBULATORY_CARE_PROVIDER_SITE_OTHER): Payer: BC Managed Care – PPO

## 2013-06-04 VITALS — BP 124/78 | HR 74 | Temp 98.4°F | Ht 64.0 in | Wt 188.0 lb

## 2013-06-04 DIAGNOSIS — R197 Diarrhea, unspecified: Secondary | ICD-10-CM

## 2013-06-04 DIAGNOSIS — R6881 Early satiety: Secondary | ICD-10-CM

## 2013-06-04 DIAGNOSIS — R1013 Epigastric pain: Secondary | ICD-10-CM

## 2013-06-04 DIAGNOSIS — K8689 Other specified diseases of pancreas: Secondary | ICD-10-CM

## 2013-06-04 LAB — COMPREHENSIVE METABOLIC PANEL
Albumin: 4.4 g/dL (ref 3.5–5.2)
CO2: 27 mEq/L (ref 19–32)
Calcium: 9.2 mg/dL (ref 8.4–10.5)
Chloride: 104 mEq/L (ref 96–112)
GFR: 100.31 mL/min (ref >60.00)
Glucose, Bld: 87 mg/dL (ref 70–99)
Potassium: 3.9 mEq/L (ref 3.5–5.1)
Sodium: 138 mEq/L (ref 135–145)
Total Bilirubin: 0.5 mg/dL (ref 0.3–1.2)
Total Protein: 7.5 g/dL (ref 6.0–8.3)

## 2013-06-04 LAB — CBC WITH DIFFERENTIAL/PLATELET
Basophils Absolute: 0 10*3/uL (ref 0.0–0.1)
Eosinophils Relative: 1.1 % (ref 0.0–5.0)
Monocytes Relative: 5 % (ref 3.0–12.0)
Neutrophils Relative %: 69.3 % (ref 43.0–77.0)
Platelets: 281 10*3/uL (ref 150.0–400.0)
RDW: 13.2 % (ref 11.5–14.6)
WBC: 6.8 10*3/uL (ref 4.5–10.5)

## 2013-06-04 LAB — LIPASE: Lipase: 78 U/L — ABNORMAL HIGH (ref 11.0–59.0)

## 2013-06-04 MED ORDER — GLYCOPYRROLATE 2 MG PO TABS: 2.0000 mg | ORAL_TABLET | Freq: Two times a day (BID) | ORAL | Status: DC

## 2013-06-04 MED ORDER — MOVIPREP 100 G PO SOLR: 1.0000 | Freq: Once | ORAL | Status: DC

## 2013-06-04 NOTE — Progress Notes (Signed)
Case discussed with physician assistant. Agree with assessment and plans as outlined

## 2013-06-04 NOTE — Progress Notes (Signed)
Subjective:    Patient ID: Cassie Berry, female    DOB: Dec 17, 1965, 47 y.o.   MRN: [id number]  HPI  Cassie Berry is a very nice 47 year old white female with history of severe post ERCP pancreatitis June 2013. Patient had an MRCP in November of 2013 which did reveal a distal pancreatic duct stricture. She also has history of chronic GERD and had an EGD in July of 2013 while recovering from the pancreatitis which showed duodenitis and stenosis in the descending duodenum. Her pancreatitis course was complicated by prolonged feeding difficulties which eventually resolved and also a DVT in her upper right extremity. Over the past year she has continued to have intermittent upper abdominal pain with episodes sometimes lasting a day or 2 and requiring occasional doses of morphine. She says she has never felt completely well. She is frustrated and somewhat tearful today because she continues to have intermittent episodes of upper abdominal discomfort which is usually burning in nature it radiates to her back. She says most of these episodes she can get through without taking pain medication. She has gained weight over the past year as she had been eating a high carb diet and is now trying to lose some weight DD and more protein in last carbs. Over the past 3 months she has been having episodes of diarrhea and urgency. She says she will has these episodes at least once weekly and usually only lasting for a day and then goes back to fairly regular bowel movements. She says with these episodes she will have severe urgency and has had some accidents which is quite distressing to her. She may go or 5 times in the day. No melena or hematochezia. She has had some associated abdominal cramping. No fever or chills. When last seen in August she had been given a trial of Bentyl which she says does help with the abdominal cramping but makes her sleepy and that she's not been taking it regularly. She does continue on Prilosec 40 mg  by mouth daily. Very remotely she had possible diagnosis of Crohn's disease this was in the early 1990s but according to [facility] she has never had colonoscopy.    Review of Systems  Constitutional: Negative.   HENT: Negative.   Eyes: Negative.   Respiratory: Negative.   Gastrointestinal: Positive for nausea, abdominal pain, diarrhea and abdominal distention.  Endocrine: Negative.   Genitourinary: Negative.   Musculoskeletal: Negative.   Allergic/Immunologic: Negative.   Neurological: Negative.   Hematological: Negative.   Psychiatric/Behavioral: Positive for dysphoric mood.   Outpatient Prescriptions Prior to Visit  Medication Sig Dispense Refill  . buPROPion (WELLBUTRIN XL) 300 MG 24 hr tablet Take 300 mg by mouth every morning.       . cetirizine (ZYRTEC ALLERGY) 10 MG tablet Take 10 mg by mouth daily.      Marland Kitchen dicyclomine (BENTYL) 10 MG capsule Take 1 tab twice daily for cramping and spasms.  60 capsule  1  . fluticasone (FLONASE) 50 MCG/ACT nasal spray Place 1 spray into the nose daily.      Marland Kitchen LORazepam (ATIVAN) 0.5 MG tablet Take 1 mg by mouth at bedtime.       Marland Kitchen morphine (MSIR) 15 MG tablet Take 0.5 tablets (7.5 mg total) by mouth every 4 (four) hours as needed.  30 tablet  0  . omeprazole (PRILOSEC) 40 MG capsule Take 1 capsule (40 mg total) by mouth daily.  30 capsule  3  . promethazine (PHENERGAN) 25 MG tablet  Take 1 tablet (25 mg total) by mouth every 6 (six) hours as needed for nausea.  30 tablet  3  . cyclobenzaprine (FLEXERIL) 10 MG tablet Take 1 tablet (10 mg total) by mouth 3 (three) times daily as needed. For muscle spasms  30 tablet  2   No facility-administered medications prior to visit.   Allergies  Allergen Reactions  . Macrodantin Rash  . Dilaudid [Hydromorphone Hcl] Other (See Comments)    Hallucinations   . Nitrofurantoin Rash   Patient Active Problem List   Diagnosis Date Noted  . Bowel habit changes 03/19/2013  . Abdominal  pain, other specified site  03/19/2013  . GERD (gastroesophageal reflux disease) 03/19/2013  . Pancreatic duct stricture 07/31/2012  . Encounter for long-term (current) use of anticoagulants 03/06/2012  . Nausea with vomiting 02/18/2012  . Deep vein thrombosis, upper right extremity 02/12/2012  . Feeding difficulties 02/11/2012  . Nonspecific (abnormal) findings on radiological and other examination of gastrointestinal tract 02/11/2012  . Anemia 02/01/2012  . Bilateral pleural effusion 01/30/2012  . Fever 01/30/2012  . Hyponatremia 01/30/2012  . Acute pancreatitis 01/23/2012  . Right hip pain 11/28/2010  . Greater trochanteric bursitis 11/28/2010  . Leg length inequality 11/28/2010  . ABDOMINAL PAIN-RUQ 09/05/2009  . ABDOMINAL PAIN, EPIGASTRIC 08/21/2009   History  Substance Use Topics  . Smoking status: Former Smoker    Types: Cigarettes    Quit date: 08/12/1988  . Smokeless tobacco: Never Used  . Alcohol Use: Yes     Comment: Occasional      family history includes Breast cancer in her maternal aunt; Colon cancer in her cousin and paternal grandfather; Diabetes in her father; Heart disease in her father; Lung cancer in her mother; Prostate cancer in her paternal grandfather.  Objective:   Physical Exam   well-developed white female in no acute distress, tearful. Blood pressure 124/78 pulse 74 temp 98 4 height 5 foot 4 weight 188. HEENT; nontraumatic normocephalic EOMI PERRLA sclera anicteric, Supple; no JVD, Cardiovascular regular rate and rhythm with S1-S2 no murmur or gallop, Pulmonary; clear bilaterally, Abdomen; soft she is tender across the upper abdomen and in the epigastrium there is no guarding or rebound no palpable mass or hepatosplenomegaly bowel sounds are present, Rectal ;exam not done, Ext;no clubbing cyanosis or edema skin warm and dry, Psych; mood and affect normal and appropriate        Assessment & Plan:  #30 47 year old female status post severe post ERCP pancreatitis summer 2013 with  persistent intermittent epigastric pain and early satiety and now with intermittent episodes of urgency diarrhea and occasional incontinence. Etiology of her symptoms is not clear-she did have evidence of duodenal stenosis on EGD done in July of 2013-and may have persistent stricture. Diarrhea may be secondary to IBS, possible component of pancreatic insufficiency, or underlying IBD  #2 pancreatic duct stricture- distal #3 chronic GERD #4 history of right upper extremity DVT-related to PICC  Plan; will check CBC, CMET, and lipase as well as qualitative fecal fat Stop Bentyl,start trial of Robinul Forte 2 mg every morning on arising in hopes of averting  episodes of incontinence, she may take the second dose later today as needed Start trial of Creon-36 one by mouth before each meal-samples given today Discussed with Dr. Marina Goodell and will schedule for EGD and colonoscopy for further diagnostic evaluation. Procedures discussed in detail with the patient and she is agreeable to proceed

## 2013-06-04 NOTE — Telephone Encounter (Signed)
Patient with diarrhea and abdominal pain She will come in today and see Amy Esterwood PA at 2:30

## 2013-06-04 NOTE — Patient Instructions (Signed)
Please go to the basement level to have your labs drawn.  Stop the Bentyl. We sent a prescription for Robinul Forte 2 mg, Take 1 tab in the Am and a 2nd tablet later in the day.  We have given you samples of Creon, Take 1 tab with each meal. If this helps you we will then do a prescription. Please let us know after a week of taking the Creon.  You have been scheduled for an endoscopy and colonoscopy with propofol. Please follow the written instructions given to you at your visit today. Please pick up your prep at the pharmacy within the next 1-3 days. [facility] [address], [place], [place].

## 2013-06-07 ENCOUNTER — Encounter: Payer: Self-pay | Admitting: Internal Medicine

## 2013-06-10 ENCOUNTER — Other Ambulatory Visit: Payer: BC Managed Care – PPO

## 2013-06-10 ENCOUNTER — Encounter: Payer: Self-pay | Admitting: Internal Medicine

## 2013-06-10 ENCOUNTER — Ambulatory Visit (AMBULATORY_SURGERY_CENTER): Payer: BC Managed Care – PPO | Admitting: Internal Medicine

## 2013-06-10 ENCOUNTER — Other Ambulatory Visit: Payer: Self-pay | Admitting: Physician Assistant

## 2013-06-10 VITALS — BP 131/75 | HR 68 | Temp 97.1°F | Resp 19 | Ht 64.0 in | Wt 188.0 lb

## 2013-06-10 DIAGNOSIS — R197 Diarrhea, unspecified: Secondary | ICD-10-CM

## 2013-06-10 DIAGNOSIS — R1013 Epigastric pain: Secondary | ICD-10-CM

## 2013-06-10 DIAGNOSIS — Z1211 Encounter for screening for malignant neoplasm of colon: Secondary | ICD-10-CM

## 2013-06-10 DIAGNOSIS — R6881 Early satiety: Secondary | ICD-10-CM

## 2013-06-10 MED ORDER — SODIUM CHLORIDE 0.9 % IV SOLN: 500.0000 mL | INTRAVENOUS | Status: DC

## 2013-06-10 NOTE — Patient Instructions (Signed)
YOU HAD AN ENDOSCOPIC PROCEDURE TODAY AT [facility]: Refer to the procedure report that was given to you for any specific questions about what was found during the examination.  If the procedure report does not answer your questions, please call your gastroenterologist to clarify.  If you requested that your care partner not be given the details of your procedure findings, then the procedure report has been included in a sealed envelope for you to review at your convenience later.  YOU SHOULD EXPECT: Some feelings of bloating in the abdomen. Passage of more gas than usual.  Walking can help get rid of the air that was put into your GI tract during the procedure and reduce the bloating. If you had a lower endoscopy (such as a colonoscopy or flexible sigmoidoscopy) you may notice spotting of blood in your stool or on the toilet paper. If you underwent a bowel prep for your procedure, then you may not have a normal bowel movement for a few days.  DIET: Your first meal following the procedure should be a light meal and then it is ok to progress to your normal diet.  A half-sandwich or bowl of soup is an example of a good first meal.  Heavy or fried foods are harder to digest and may make you feel nauseous or bloated.  Likewise meals heavy in dairy and vegetables can cause extra gas to form and this can also increase the bloating.  Drink plenty of fluids but you should avoid alcoholic beverages for 24 hours.  ACTIVITY: Your care partner should take you home directly after the procedure.  You should plan to take it easy, moving slowly for the rest of the day.  You can resume normal activity the day after the procedure however you should NOT DRIVE or use heavy machinery for 24 hours (because of the sedation medicines used during the test).    SYMPTOMS TO REPORT IMMEDIATELY: A gastroenterologist can be reached at any hour.  During normal business hours, 8:30 AM to 5:00 PM Monday through Friday,  call [phone].  After hours and on weekends, please call the GI answering service at [phone]  Emergency number who will take a message and have the physician on call contact you.   Following lower endoscopy (colonoscopy or flexible sigmoidoscopy):  Excessive amounts of blood in the stool  Significant tenderness or worsening of abdominal pains  Swelling of the abdomen that is new, acute  Fever of 100F or higher  Following upper endoscopy (EGD)  Vomiting of blood or coffee ground material  New chest pain or pain under the shoulder blades  Painful or persistently difficult swallowing  New shortness of breath  Fever of 100F or higher  Black, tarry-looking stools  FOLLOW UP: If any biopsies were taken you will be contacted by phone or by letter within the next 1-3 weeks.  Call your gastroenterologist if you have not heard about the biopsies in 3 weeks.  Our staff will call the home number listed on your records the next business day following your procedure to check on you and address any questions or concerns that you may have at that time regarding the information given to you following your procedure. This is a courtesy call and so if there is no answer at the home number and we have not heard from you through the emergency physician on call, we will assume that you have returned to your regular daily activities without incident.  SIGNATURES/CONFIDENTIALITY: You  and/or your care partner have signed paperwork which will be entered into your electronic medical record.  These signatures attest to the fact that that the information above on your After Visit Summary has been reviewed and is understood.  Full responsibility of the confidentiality of this discharge information lies with you and/or your care-partner.  Handouts on diverticulosis, high fiber diet  Continue current medicines and begin the creon prescribed before meals   Call Dr Lamar Sprinkles office in the next 2-3 days at  [phone] to schedule an appointment in 4-6 weeks

## 2013-06-10 NOTE — Progress Notes (Signed)
Pt reports pic line rt arm June 2013 caused blood clot in rt arm ,stayed on coumadin x one year . Finished coumadin may 2014;therefore K Tyrrell RN  looked for IV site in left hand/arm but unable to find a site . Pt ok'd Iv stick to be done in rt hand/arm saying she has had sticks in rt arm since blood clot.No s&s of redness or swelling rt hand/arm noted. IV started rt A/C by Isabelle Course RN

## 2013-06-10 NOTE — Progress Notes (Signed)
Patient did not experience any of the following events: a burn prior to discharge; a fall within the facility; wrong site/side/patient/procedure/implant event; or a hospital transfer or hospital admission upon discharge from the facility. (G8907)Patient did not have preoperative order for IV antibiotic SSI prophylaxis. (G8918) ewm 

## 2013-06-10 NOTE — Op Note (Signed)
[facility] [address] [place], [address]   ENDOSCOPY PROCEDURE REPORT  PATIENT: Cassie Berry, Cassie Berry  MR#: [id number] BIRTHDATE: 22-Nov-1965 , 46  yrs. old GENDER: Female ENDOSCOPIST: Roxy Cedar, MD REFERRED BY:  .  Self / Office PROCEDURE DATE:  06/10/2013 PROCEDURE:  EGD, diagnostic ASA CLASS:     Class III INDICATIONS:  Epigastric pain. MEDICATIONS: MAC sedation, administered by CRNA and propofol (Diprivan) 150mg  IV TOPICAL ANESTHETIC: Cetacaine Spray  DESCRIPTION OF PROCEDURE: After the risks benefits and alternatives of the procedure were thoroughly explained, informed consent was obtained.  The LB [id number] endoscope was introduced through the mouth and advanced to the second portion of the duodenum. Without limitations.  The instrument was slowly withdrawn as the mucosa was fully examined.      EXAM: The upper, middle and distal third of the esophagus were carefully inspected and no abnormalities were noted.  The z-line was well seen at the GEJ.  The endoscope was pushed into the fundus which was normal including a retroflexed view.  The antrum, gastric body, first and second part of the duodenum were unremarkable. Retroflexed views revealed no abnormalities.     The scope was then withdrawn from the patient and the procedure completed.  COMPLICATIONS: There were no complications. ENDOSCOPIC IMPRESSION: 1. Normal EGD  RECOMMENDATIONS: 1.  Continue current meds. Also begin recently prescribed Creon before meals 2.  Call office next 2-3 days to schedule an office appointment for 4-6 weeks  REPEAT EXAM:  eSigned:  Roxy Cedar, MD 06/10/2013 12:56 PM   FA:OZHYQM D Renne Crigler, MD and The Patient

## 2013-06-10 NOTE — Op Note (Signed)
[facility] [address] [place], [address]   COLONOSCOPY PROCEDURE REPORT  PATIENT: Cassie Berry, Cassie Berry  MR#: [id number] BIRTHDATE: 1966/07/17 , 46  yrs. old GENDER: Female ENDOSCOPIST: Roxy Cedar, MD REFERRED BY:.  Self / Office PROCEDURE DATE:  06/10/2013 PROCEDURE:   Colonoscopy, screening First Screening Colonoscopy - Avg.  risk and is 50 yrs.  old or older - No.  Prior Negative Screening - Now for repeat screening. N/A  History of Adenoma - Now for follow-up colonoscopy & has been > or = to 3 yrs.  N/A  Polyps Removed Today? No.  Recommend repeat exam, <10 yrs? No. ASA CLASS:   Class III INDICATIONS:unexplained diarrhea and average risk screening. MEDICATIONS: MAC sedation, administered by CRNA and propofol (Diprivan) 300mg  IV  DESCRIPTION OF PROCEDURE:   After the risks benefits and alternatives of the procedure were thoroughly explained, informed consent was obtained.  A digital rectal exam revealed no abnormalities of the rectum.   The LB [id number]  endoscope was introduced through the anus and advanced to the cecum, which was identified by both the appendix and ileocecal valve. No adverse events experienced.   The quality of the prep was excellent, using MoviPrep  The instrument was then slowly withdrawn as the colon was fully examined.  COLON FINDINGS: The mucosa appeared normal in the terminal ileum. Mild diverticulosis was noted in the sigmoid colon.   The colon was otherwise normal.  There was no  inflammation, polyps or cancers unless previously stated.  Retroflexed views revealed no abnormalities. The time to cecum=3 minutes 20 seconds.  Withdrawal time=9 minutes 0 seconds.  The scope was withdrawn and the procedure completed. COMPLICATIONS: There were no complications.  ENDOSCOPIC IMPRESSION: 1.   Normal mucosa in the terminal ileum 2.   Mild diverticulosis was noted in the sigmoid colon 3.   The colon was otherwise  normal  RECOMMENDATIONS: 1.  Continue current colorectal screening recommendations for "routine risk" patients with a repeat colonoscopy in 10 years. 2.  Upper endoscopy today (see report)   eSigned:  Roxy Cedar, MD 06/10/2013 12:51 PM   cc: Romero Liner, MD and The Patient   PATIENT NAME:  Jakia, Kennebrew MR#: [id number]

## 2013-06-11 ENCOUNTER — Telehealth: Payer: Self-pay | Admitting: *Deleted

## 2013-06-11 ENCOUNTER — Telehealth: Payer: Self-pay | Admitting: Gastroenterology

## 2013-06-11 LAB — FECAL FAT QUALITATIVE
Free Fatty Acids: NORMAL
NEUTRAL FAT: NORMAL

## 2013-06-11 NOTE — Telephone Encounter (Signed)
  Follow up Call-  Call back number 06/10/2013  Post procedure Call Back phone  # [phone]  Permission to leave phone message Yes     Patient questions:  Do you have a fever, pain , or abdominal swelling? no Pain Score  0 *  Have you tolerated food without any problems? yes  Have you been able to return to your normal activities? yes  Do you have any questions about your discharge instructions: Diet   no Medications  no Follow up visit  no  Do you have questions or concerns about your Care? no  Actions: * If pain score is 4 or above: No action needed, pain <4.pt's husband says pt is doing fine ,denied any problems and had no questions .

## 2013-06-11 NOTE — Telephone Encounter (Signed)
She called last night on call. Temp 100.0.  Feels "achey all over" but otherwise normal.  No abdominal pains, no nausea.  Ate a meal already after procedures and no vomiting, nausea or pains.  No sick contacts.  Not coughing.  Perhaps prodromal viral.  Propofol can cause fever as well.  She took some tylenol already.  She knows to call back if feeling worse or if any specific GI symptoms.

## 2013-06-17 ENCOUNTER — Other Ambulatory Visit: Payer: Self-pay

## 2013-07-07 ENCOUNTER — Ambulatory Visit (INDEPENDENT_AMBULATORY_CARE_PROVIDER_SITE_OTHER): Payer: BC Managed Care – PPO | Admitting: Internal Medicine

## 2013-07-07 ENCOUNTER — Encounter: Payer: Self-pay | Admitting: Internal Medicine

## 2013-07-07 VITALS — BP 110/78 | HR 72 | Ht 64.0 in | Wt 186.4 lb

## 2013-07-07 DIAGNOSIS — R109 Unspecified abdominal pain: Secondary | ICD-10-CM

## 2013-07-07 DIAGNOSIS — K861 Other chronic pancreatitis: Secondary | ICD-10-CM

## 2013-07-07 DIAGNOSIS — R197 Diarrhea, unspecified: Secondary | ICD-10-CM

## 2013-07-07 DIAGNOSIS — K8689 Other specified diseases of pancreas: Secondary | ICD-10-CM

## 2013-07-07 MED ORDER — PANCRELIPASE (LIP-PROT-AMYL) 24000-76000 UNITS PO CPEP: 1.0000 | ORAL_CAPSULE | Freq: Every day | ORAL | Status: DC

## 2013-07-07 MED ORDER — PROMETHAZINE HCL 25 MG PO TABS: 25.0000 mg | ORAL_TABLET | Freq: Four times a day (QID) | ORAL | Status: AC | PRN

## 2013-07-07 NOTE — Patient Instructions (Signed)
We have sent the following medications to your pharmacy for you to pick up at your convenience:  Phenergan  You have been given some samples of Creon.  Call [facility] and let us know when you need a prescription  Please follow up with Dr. Marina Goodell in 6 months

## 2013-07-07 NOTE — Progress Notes (Signed)
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 Previous patient of Dr. Juanda Chance. I assume the patient's care, at her request, December 2013. I last saw her for routine followup June 2014. At that time, she was doing well except for manageable intermittent abdominal discomfort. She was subsequently seen in October 2014 by the extender regarding new onset diarrhea with occasional incontinence and epigastric pain with bloating and early satiety. See that dictation. Laboratories were unremarkable. She subsequently underwent colonoscopy and upper endoscopy 06/10/2013. Colonoscopy including intubation of the terminal ileum was normal except for mild sigmoid diverticulosis. Upper endoscopy was normal. She had been prescribed Creon for possible pancreatic insufficiency. She presents today for followup. Patient is happy to report that her diarrhea and abdominal complaints have resolved with Creon supplementation. She takes this with meals. All couple of occasions she did not take Creon with meals and had recurrent symptoms. She has required only one half of a morphine tablet in 8 weeks. She needs a prescription for Creon and promethazine. No new problems. She requires about returning to work  REVIEW OF SYSTEMS:  All non-GI ROS negative   Past Medical History  Diagnosis Date  . GERD (gastroesophageal reflux disease)   . Anxiety   . Migraine headache   . PONV (postoperative nausea and vomiting)   . Pneumonia     02/2011  . Pancreatitis   . Duodenal stenosis   . DVT (deep venous thrombosis)   . Duodenitis   . Depression   . ADD (attention deficit disorder)   . Seizures     pt thinks she may have had febrile seizure in hospital last year june 2013  . Diverticulosis     Past Surgical History  Procedure Laterality Date  .  Appendectomy    . Cholecystectomy    . Ercp  01/23/2012    Procedure: ENDOSCOPIC RETROGRADE CHOLANGIOPANCREATOGRAPHY (ERCP);  Surgeon: Louis Meckel, MD;  Location: [facility];  Service: Endoscopy;  Laterality: N/A;  . Sphincterotomy  01/23/2012    Procedure: SPHINCTEROTOMY;  Surgeon: Louis Meckel, MD;  Location: [facility];  Service: Endoscopy;  Laterality: N/A;  . Esophagogastroduodenoscopy  01/25/2012    Procedure: ESOPHAGOGASTRODUODENOSCOPY (EGD);  Surgeon: Rachael Fee, MD;  Location: [facility];  Service: Endoscopy;  Laterality: N/A;  . Esophagogastroduodenoscopy  02/20/2012    Procedure: ESOPHAGOGASTRODUODENOSCOPY (EGD);  Surgeon: Meryl Dare, MD,FACG;  Location: [facility];  Service: Endoscopy;  Laterality: N/A;  ?feeding tube placement at same time post pyloric tube    Social History [name]  reports that she quit smoking about 24 years ago. Her smoking use included Cigarettes. She smoked 0.00 packs per day. She has never used smokeless tobacco. She reports that she drinks alcohol. She reports that she does not use illicit drugs.  family history includes Breast cancer in her maternal aunt; Colon cancer in her cousin and paternal grandfather; Diabetes in her father; Heart disease in her father; Lung cancer in her mother; Prostate cancer in her paternal grandfather.  Allergies  Allergen Reactions  . Macrodantin Rash  . Dilaudid [Hydromorphone Hcl] Other (See Comments)    Hallucinations   . Nitrofurantoin Rash       PHYSICAL EXAMINATION: Vital signs: BP 110/78  Pulse 72  Ht 5\' 4"  (1.626 m)  Wt 186 lb 6.4 oz (84.55 kg)  BMI 31.98 kg/m2  LMP 12/11/2010 General: Well-developed, well-nourished, no acute distress Abdomen: Not reexamined Psychiatric: alert and oriented x3. Cooperative   ASSESSMENT:  #1. History of severe post ERCP pancreatitis June 2013. Intermittent problems with abdominal pain continued to improve over time. 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 Probable pancreatic insufficiency. Improved with Creon #3. GERD. Asymptomatic on PPI #4. 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 Previously on Coumadin. Has completed anticoagulation therapy   PLAN:  #1. Continue Creon. Prescription to be refilled #2. Continue PPI #3. Refill promethazine #4. Continue to increase activities as tolerated. She may return to work only if she wishes and feels that she is up to the task #5. Routine office followup in 6 months

## 2013-07-20 ENCOUNTER — Telehealth: Payer: Self-pay | Admitting: Internal Medicine

## 2013-07-20 MED ORDER — PANCRELIPASE (LIP-PROT-AMYL) 24000-76000 UNITS PO CPEP: 1.0000 | ORAL_CAPSULE | Freq: Every day | ORAL | Status: DC

## 2013-07-20 NOTE — Telephone Encounter (Signed)
Sent rx for Creon to pharmacy per patient

## 2013-07-21 ENCOUNTER — Other Ambulatory Visit: Payer: Self-pay

## 2013-07-21 ENCOUNTER — Telehealth: Payer: Self-pay | Admitting: Internal Medicine

## 2013-07-21 MED ORDER — PANCRELIPASE (LIP-PROT-AMYL) 24000-76000 UNITS PO CPEP: 1.0000 | ORAL_CAPSULE | Freq: Every day | ORAL | Status: DC

## 2013-07-22 NOTE — Telephone Encounter (Signed)
Resent creon to pharmacy

## 2013-08-09 ENCOUNTER — Other Ambulatory Visit: Payer: Self-pay | Admitting: Nurse Practitioner

## 2013-08-19 ENCOUNTER — Telehealth: Payer: Self-pay | Admitting: Internal Medicine

## 2013-08-19 MED ORDER — PANCRELIPASE (LIP-PROT-AMYL) 36000-114000 UNITS PO CPEP: 2.0000 | ORAL_CAPSULE | Freq: Three times a day (TID) | ORAL | Status: DC

## 2013-08-19 MED ORDER — PANCRELIPASE (LIP-PROT-AMYL) 36000-114000 UNITS PO CPEP: ORAL_CAPSULE | ORAL | Status: DC

## 2013-08-19 NOTE — Addendum Note (Signed)
Addended by: Rosanne Sack R on: 08/19/2013 04:22 PM   Modules accepted: Orders

## 2013-08-19 NOTE — Telephone Encounter (Signed)
Samples of Creon and script left up front for pt to pick up.

## 2013-09-21 ENCOUNTER — Telehealth: Payer: Self-pay | Admitting: Internal Medicine

## 2013-09-21 MED ORDER — MORPHINE SULFATE 15 MG PO TABS: 7.5000 mg | ORAL_TABLET | ORAL | Status: DC | PRN

## 2013-09-21 NOTE — Telephone Encounter (Signed)
Pt aware and will pick up the script. States she will keep [place] posted on her progress.

## 2013-09-21 NOTE — Telephone Encounter (Signed)
You can call in some MSIR for her as previous. Ask her if She feels like she needs to be seen in the office. If so, and he knows her best. If not, she should keep [place] posted on her progress

## 2013-09-21 NOTE — Telephone Encounter (Signed)
Pt states that last Thursday she started having some abdominal pain. States Sunday night the pain was very bad. Now reports she has a constant ache with some burning. Pt would like something for pain. Last pain med she had was MSIR 15mg  tablets, pt took 1/2 tab every 4 hours prn. Please advise.

## 2013-09-23 ENCOUNTER — Ambulatory Visit: Payer: Self-pay | Admitting: Cardiovascular Disease

## 2013-09-23 DIAGNOSIS — I82621 Acute embolism and thrombosis of deep veins of right upper extremity: Secondary | ICD-10-CM

## 2013-09-23 DIAGNOSIS — Z7901 Long term (current) use of anticoagulants: Secondary | ICD-10-CM

## 2013-12-29 ENCOUNTER — Other Ambulatory Visit: Payer: Self-pay | Admitting: Obstetrics and Gynecology

## 2013-12-29 DIAGNOSIS — R928 Other abnormal and inconclusive findings on diagnostic imaging of breast: Secondary | ICD-10-CM

## 2014-01-13 ENCOUNTER — Ambulatory Visit
Admission: RE | Admit: 2014-01-13 | Discharge: 2014-01-13 | Disposition: A | Discharge status: Home / Self Care | Payer: BC Managed Care – PPO | Source: Ambulatory Visit | Attending: Obstetrics and Gynecology | Admitting: Obstetrics and Gynecology

## 2014-01-13 DIAGNOSIS — R928 Other abnormal and inconclusive findings on diagnostic imaging of breast: Secondary | ICD-10-CM

## 2014-01-20 ENCOUNTER — Encounter: Payer: Self-pay | Admitting: Neurology

## 2014-01-20 ENCOUNTER — Ambulatory Visit (INDEPENDENT_AMBULATORY_CARE_PROVIDER_SITE_OTHER): Payer: BC Managed Care – PPO | Admitting: Neurology

## 2014-01-20 VITALS — BP 138/92 | HR 76 | Temp 98.3°F | Ht 64.0 in | Wt 186.0 lb

## 2014-01-20 DIAGNOSIS — T50905A Adverse effect of unspecified drugs, medicaments and biological substances, initial encounter: Secondary | ICD-10-CM

## 2014-01-20 DIAGNOSIS — R413 Other amnesia: Secondary | ICD-10-CM

## 2014-01-20 DIAGNOSIS — K859 Acute pancreatitis without necrosis or infection, unspecified: Secondary | ICD-10-CM

## 2014-01-20 DIAGNOSIS — K861 Other chronic pancreatitis: Secondary | ICD-10-CM

## 2014-01-20 DIAGNOSIS — R5381 Other malaise: Secondary | ICD-10-CM

## 2014-01-20 DIAGNOSIS — R5383 Other fatigue: Principal | ICD-10-CM

## 2014-01-20 DIAGNOSIS — R569 Unspecified convulsions: Secondary | ICD-10-CM

## 2014-01-20 DIAGNOSIS — R531 Weakness: Secondary | ICD-10-CM

## 2014-01-20 NOTE — Patient Instructions (Addendum)
You have vague symptoms of fatigue, muscle weakness, memory loss, lack of energy.   Please ask family and friends or your significant other or bed partner if you snore and if so, how loud it is, and if you have breathing related issues in your sleep, such as: snorting sounds, choking sounds, pauses in your breathing or shallow breathing events. These may be symptoms of obstructive sleep apnea (OSA).   We will do a brain MRI, EEG, blood work.   Please remember, common seizure triggers are: Sleep deprivation, dehydration, overheating, stress, hypoglycemia or skipping meals, certain medications or excessive alcohol use, especially stopping alcohol abruptly if you have had heavy alcohol use before (aka alcohol withdrawal seizure). If you have a prolonged seizure over 2-5 minutes or back to back seizures, call or have someone call 911 or take you to the nearest emergency room. You cannot drive a car or operate any other machinery or vehicle within 6 months of a seizure. Please do not swim alone or take a tub bath for safety. Do not cook with large quantities of boiling water or oil for safety. Take your medicine for seizure prevention regularly and do not skip doses or stop medication abruptly and tone are told to do so by your healthcare provider. Try to get a refill on your antiepileptic medication ahead of time, so you are not at risk of running out. If you run out of the seizure medication and do not have a refill at hand she may run into medication withdrawal seizures. Avoid taking Wellbutrin, narcotic pain medications and tramadol, as they can lower seizure threshold.

## 2014-01-20 NOTE — Progress Notes (Signed)
Subjective:    Patient ID: Cassie Berry is a 48 y.o. female.  HPI    Star Age, MD, PhD [facility] [address], [address] [place], [place] [address]  Dear Dr. Shelia Media,   Saw your patient, Cassie Berry, upon your kind request in my neurologic clinic today for initial consultation of her muscle weakness. The patient is unaccompanied today. As you know, Cassie Berry is a 48 year old right-handed woman with a complex medical history of recurrent pancreatitis, DVT, pneumonia in July 2012, ADD, history of migraines with aura, allergic rhinitis, anxiety, status post cholecystectomy, appendectomy, ERCP with sphincterectomy and stent placement in July 2013, reflux disease, who reports intermittent muscle weakness and fatigue and forgetfulness for the last 3 years. She has lack of energy and denies any focal weakness, atrophy, fasciculations, twitching, but endorses muscle cramps at night. She has had difficulty with her sleep since 2013, and has been on Ativan for sleep. She has been given Belsomra samples, but has not started it yet. Never had TIA or stroke symptoms, denying sudden onset of one sided weakness, numbness, tingling, slurring of speech or droopy face, hearing loss, tinnitus, diplopia or visual field cut or monocular loss of vision, and denies recurrent headaches.  She states, she had a seizure in 2013 in the context of fever and being on Dilaudid and also reported hallucinations on Dilauded, which was therefore changed to morphine. She has prn MSIR 15 mg available for pain with flare up of her pancreatitis. She feels she is forgetful. She is not paying attention very well. She is finding it difficult to multitask or stay on task.  Her Past Medical History Is Significant For: Past Medical History  Diagnosis Date  . GERD (gastroesophageal reflux disease)   . Anxiety   . Migraine headache   . PONV (postoperative nausea and vomiting)   . Pneumonia      02/2011  . Pancreatitis   . Duodenal stenosis   . DVT (deep venous thrombosis)   . Duodenitis   . Depression   . ADD (attention deficit disorder)   . Seizures     pt thinks she may have had febrile seizure in hospital last year june 2013  . Diverticulosis     Her Past Surgical History Is Significant For: Past Surgical History  Procedure Laterality Date  . Appendectomy    . Cholecystectomy    . Ercp  01/23/2012    Procedure: ENDOSCOPIC RETROGRADE CHOLANGIOPANCREATOGRAPHY (ERCP);  Surgeon: Inda Castle, MD;  Location: [facility];  Service: Endoscopy;  Laterality: N/A;  . Sphincterotomy  01/23/2012    Procedure: SPHINCTEROTOMY;  Surgeon: Inda Castle, MD;  Location: [facility];  Service: Endoscopy;  Laterality: N/A;  . Esophagogastroduodenoscopy  01/25/2012    Procedure: ESOPHAGOGASTRODUODENOSCOPY (EGD);  Surgeon: Milus Banister, MD;  Location: [facility];  Service: Endoscopy;  Laterality: N/A;  . Esophagogastroduodenoscopy  02/20/2012    Procedure: ESOPHAGOGASTRODUODENOSCOPY (EGD);  Surgeon: Ladene Artist, MD,FACG;  Location: [facility];  Service: Endoscopy;  Laterality: N/A;  ?feeding tube placement at same time post pyloric tube    Her Family History Is Significant For: Family History  Problem Relation Age of Onset  . Colon cancer Cousin     Paternal side  . Colon cancer Paternal Grandfather   . Lung cancer Mother   . Diabetes Father   . Heart disease Father   . Prostate cancer Paternal Grandfather   . Breast cancer Maternal Aunt   .  Diabetes Sister   . Hypertension Brother   . Hyperlipidemia Brother   . Hypertension Father     Her Social History Is Significant For: History   Social History  . Marital Status: Married    Spouse Name: N/A    Number of Children: 2  . Years of Education: N/A   Occupational History  . RN    Social History Main Topics  . Smoking status: Former Smoker    Types: Cigarettes    Quit date: 08/12/1988  . Smokeless tobacco:  Never Used  . Alcohol Use: Yes     Comment: Occasional  . Drug Use: No  . Sexual Activity: None   Other Topics Concern  . None   Social History Narrative   Daily caffeine    Her Allergies Are:  Allergies  Allergen Reactions  . Macrodantin Rash  . Dilaudid [Hydromorphone Hcl] Other (See Comments)    Hallucinations   . Nitrofurantoin Rash  :   Her Current Medications Are:  Outpatient Encounter Prescriptions as of 01/20/2014  Medication Sig  . buPROPion (WELLBUTRIN XL) 300 MG 24 hr tablet Take 300 mg by mouth every morning.   . cetirizine (ZYRTEC ALLERGY) 10 MG tablet Take 10 mg by mouth daily.  . fluocinonide cream (LIDEX) 0.05 %   . fluticasone (FLONASE) 50 MCG/ACT nasal spray Place 1 spray into the nose daily.  Marland Kitchen LORazepam (ATIVAN) 0.5 MG tablet Take 1 mg by mouth at bedtime.   . Pancrelipase, Lip-Prot-Amyl, (CREON) 36000 UNITS CPEP Take 2 capsules by mouth 3 (three) times daily before meals.  . pantoprazole (PROTONIX) 40 MG tablet Take 1 tablet by mouth daily.  . promethazine (PHENERGAN) 25 MG tablet Take 1 tablet (25 mg total) by mouth every 6 (six) hours as needed for nausea.  Marland Kitchen glycopyrrolate (ROBINUL) 2 MG tablet Take 1 tablet (2 mg total) by mouth 2 (two) times daily.  Marland Kitchen morphine (MSIR) 15 MG tablet Take 0.5 tablets (7.5 mg total) by mouth every 4 (four) hours as needed.  . [DISCONTINUED] omeprazole (PRILOSEC) 40 MG capsule TAKE 1 CAPSULE DAILY  . [DISCONTINUED] Pancrelipase, Lip-Prot-Amyl, (CREON) 36000 UNITS CPEP Take 2 capsules 3 times daily before meals  . [DISCONTINUED] Pancrelipase, Lip-Prot-Amyl, 24000 UNITS CPEP Take 1 capsule (24,000 Units total) by mouth daily.  :   Review of Systems:  Out of a complete 14 point review of systems, all are reviewed and negative with the exception of these symptoms as listed below:  Review of Systems  Constitutional: Positive for fatigue.  HENT: Positive for tinnitus.   Eyes: Negative.   Respiratory: Positive for shortness  of breath.   Cardiovascular: Positive for palpitations and leg swelling.       Murmur  Gastrointestinal: Positive for diarrhea.       Incontinence  Endocrine: Negative.   Genitourinary: Positive for enuresis.  Musculoskeletal: Positive for arthralgias and myalgias.       Muscle cramps  Skin: Negative.   Allergic/Immunologic: Positive for environmental allergies.  Neurological: Positive for dizziness, weakness and headaches.       Memory loss  Hematological: Positive for adenopathy.  Psychiatric/Behavioral: The patient is nervous/anxious.     Objective:  Neurologic Exam  Physical Exam Physical Examination:   Filed Vitals:   01/20/14 1412  BP: 138/92  Pulse: 76  Temp: 98.3 F (36.8 C)    General Examination: The patient is a very pleasant 48 y.o. female in no acute distress. She appears well-developed and well-nourished and well groomed.  HEENT: Normocephalic, atraumatic, pupils are equal, round and reactive to light and accommodation. Funduscopic exam is normal with sharp disc margins noted. Extraocular tracking is good without limitation to gaze excursion or nystagmus noted. Normal smooth pursuit is noted. Hearing is grossly intact. Tympanic membranes are clear bilaterally. Face is symmetric with normal facial animation and normal facial sensation. Speech is clear with no dysarthria noted. There is no hypophonia. There is no lip, neck/head, jaw or voice tremor. Neck is supple with full range of passive and active motion. There are no carotid bruits on auscultation. Oropharynx exam reveals: mild mouth dryness, adequate dental hygiene and no significant airway crowding. Mallampati is class II. Tongue protrudes centrally and palate elevates symmetrically.  Chest: Clear to auscultation without wheezing, rhonchi or crackles noted.  Heart: S1+S2+0, regular and normal without murmurs, rubs or gallops noted.   Abdomen: Soft, non-tender and non-distended with normal bowel sounds  appreciated on auscultation.  Extremities: There is no pitting edema in the distal lower extremities bilaterally. Pedal pulses are intact.  Skin: Warm and dry without trophic changes noted. There are no varicose veins.  Musculoskeletal: exam reveals no obvious joint deformities, tenderness or joint swelling or erythema.   Neurologically:  Mental status: The patient is awake, alert and oriented in all 4 spheres. Her immediate and remote memory, attention, language skills and fund of knowledge are appropriate, but there is mild psychomotor retardation. There is no evidence of aphasia, agnosia, apraxia or anomia. Speech is slightly slow and mildly hypophonic. Thought process is linear. Mood is decreased range and affect is blunted.  Cranial nerves II - XII are as described above under HEENT exam. In addition: shoulder shrug is normal with equal shoulder height noted. Motor exam: Normal bulk, and tone is noted. Strength is globally 4+ out of 5 with possible slight lack of effort noted. There is no drift, tremor or rebound. Romberg is negative. There is no focal atrophy, no fasciculation, no myoclonus. Reflexes are 1+ throughout. Babinski: Toes are flexor bilaterally. Fine motor skills and coordination: intact with normal finger taps, normal hand movements, normal rapid alternating patting, normal foot taps and normal foot agility. However, she is noted to have some slowness in her movements. Cerebellar testing: No dysmetria or intention tremor on finger to nose testing. Heel to shin is unremarkable bilaterally. There is no truncal or gait ataxia.  Sensory exam: intact to light touch, pinprick, vibration, temperature sense in the upper and lower extremities.  Gait, station and balance: She stands easily. No veering to one side is noted. No leaning to one side is noted. Posture is age-appropriate and stance is narrow based. Gait shows normal stride length and normal pace. No problems turning are noted. She  turns en bloc. Tandem walk is unremarkable. Intact toe and heel stance is noted.               Assessment and Plan:   In summary, EUFELIA VENO is a very pleasant 48 y.o.-year old female with a complex medical history of recurrent pancreatitis, DVT, pneumonia in July 2012, ADD, history of migraines with aura, allergic rhinitis, anxiety, status post cholecystectomy, appendectomy, ERCP with sphincterectomy and stent placement in July 2013, reflux disease, who reports intermittent muscle weakness and fatigue and forgetfulness for the last 3 years. She presents with a vague and slowly progressive symptomatology. On physical exam she has mild slowness and her thinking and overall mild psychomotor retardation. She has no focal findings on exam. I reassured her that regard.  She has a history of an isolated seizure event in the context of fever and being on Dilaudid.  I suggested further workup in the form of EEG, brain MRI with and without contrast and blood work today. I'm not sure how to tie in all of her issues together. I did not suggest any new medication. I did encourage her to maintain a healthy lifestyle in general. I encouraged the patient to eat healthy, exercise daily and keep well hydrated, to keep a scheduled bedtime and wake time routine, to not skip any meals and eat healthy snacks in between meals and to have protein with every meal.  I answered all  her  questions today and the patient was in agreement with the above outlined plan. I would like to see the patient back in 3 months, sooner if the need arises and encouraged  her  to call with any interim questions, concerns, problems or updates.we will be calling her with her test results as they come in.   Thank you very much for allowing me to participate in the care of this nice patient. If I can be of any further assistance to you please do not hesitate to call me at [phone].  Sincerely,   Star Age, MD, PhD

## 2014-01-21 LAB — TSH: TSH: 1.33 u[IU]/mL (ref 0.450–4.500)

## 2014-01-21 LAB — HGB A1C W/O EAG: HEMOGLOBIN A1C: 5.5 % (ref 4.8–5.6)

## 2014-01-21 LAB — ENA+DNA/DS+SJORGEN'S
ENA RNP Ab: <0.2 AI (ref 0.0–0.9)
ENA SM Ab Ser-aCnc: <0.2 AI (ref 0.0–0.9)
ENA SSA (RO) Ab: <0.2 AI (ref 0.0–0.9)
ENA SSB (LA) AB: 2.2 AI — AB (ref 0.0–0.9)
dsDNA Ab: 1 IU/mL (ref 0–9)

## 2014-01-21 LAB — C-REACTIVE PROTEIN: CRP: 5.4 mg/L — ABNORMAL HIGH (ref 0.0–4.9)

## 2014-01-21 LAB — B12 AND FOLATE PANEL
Folate: >19.9 ng/mL (ref >3.0)
Vitamin B-12: 657 pg/mL (ref 211–946)

## 2014-01-21 LAB — SEDIMENTATION RATE: Sed Rate: 2 mm/hr (ref 0–32)

## 2014-01-21 LAB — ANA W/REFLEX: Anti Nuclear Antibody(ANA): POSITIVE — AB

## 2014-01-21 LAB — ALDOLASE: Aldolase: 5.4 U/L (ref 3.3–10.3)

## 2014-01-21 LAB — RPR: SYPHILIS RPR SCR: NONREACTIVE

## 2014-01-21 LAB — VITAMIN D 25 HYDROXY (VIT D DEFICIENCY, FRACTURES): VIT D 25 HYDROXY: 28.5 ng/mL — AB (ref 30.0–100.0)

## 2014-01-21 LAB — CK: CK TOTAL: 190 U/L — AB (ref 24–173)

## 2014-01-24 ENCOUNTER — Ambulatory Visit (INDEPENDENT_AMBULATORY_CARE_PROVIDER_SITE_OTHER): Payer: BC Managed Care – PPO | Admitting: Radiology

## 2014-01-24 DIAGNOSIS — R569 Unspecified convulsions: Secondary | ICD-10-CM

## 2014-01-24 DIAGNOSIS — IMO0002 Reserved for concepts with insufficient information to code with codable children: Secondary | ICD-10-CM

## 2014-01-24 DIAGNOSIS — R413 Other amnesia: Secondary | ICD-10-CM

## 2014-01-24 DIAGNOSIS — R531 Weakness: Secondary | ICD-10-CM

## 2014-01-24 DIAGNOSIS — K859 Acute pancreatitis without necrosis or infection, unspecified: Secondary | ICD-10-CM

## 2014-01-24 DIAGNOSIS — R5381 Other malaise: Secondary | ICD-10-CM

## 2014-01-24 DIAGNOSIS — R5383 Other fatigue: Secondary | ICD-10-CM

## 2014-01-24 DIAGNOSIS — Z87898 Personal history of other specified conditions: Secondary | ICD-10-CM

## 2014-01-24 DIAGNOSIS — T50905A Adverse effect of unspecified drugs, medicaments and biological substances, initial encounter: Secondary | ICD-10-CM

## 2014-01-24 NOTE — Procedures (Signed)
    History:  Cassie Berry is a 48 year old patient with a history of intermittent muscle weakness and fatigue associated with forgetfulness over the last 3 years. There is a history of a single seizure event in the past associated with fever and use of Dilaudid. The patient is being evaluated for possible seizures.  This is a routine EEG. No skull defects are noted. Medications include Wellbutrin, Zyrtec, Flonase, Ativan, morphine, Protonix, Phenergan, and Pancrelipase.   EEG classification: Normal awake  Description of the recording: The background rhythms of this recording consists of a fairly well modulated medium amplitude alpha rhythm of 9 Hz that is reactive to eye opening and closure. As the record progresses, the patient appears to remain in the waking state throughout the recording. Photic stimulation was performed, resulting in a bilateral and symmetric photic driving response. Hyperventilation was also performed, resulting in a minimal buildup of the background rhythm activities without significant slowing seen. At no time during the recording does there appear to be evidence of spike or spike wave discharges or evidence of focal slowing. EKG monitor shows no evidence of cardiac rhythm abnormalities with a heart rate of 78.  Impression: This is a normal EEG recording in the waking state. No evidence of ictal or interictal discharges are seen.

## 2014-01-24 NOTE — Progress Notes (Signed)

## 2014-01-25 NOTE — Progress Notes (Signed)
Quick Note:  Called and shared normal EEG results with patient, she verbalized understanding ______

## 2014-01-26 ENCOUNTER — Telehealth: Payer: Self-pay | Admitting: Neurology

## 2014-01-26 DIAGNOSIS — M791 Myalgia, unspecified site: Secondary | ICD-10-CM

## 2014-01-26 NOTE — Telephone Encounter (Signed)
Please call patient regarding her recent blood test results. Her vitamin D level was borderline low. I would recommend an over-the-counter vitamin D supplement, probably in the realm of 2000 units a day. Please make sure she discusses this with her GI Dr. first. Also some of her rheumatological markers came back positive. While I am not sure how this relates to her symptoms I would recommend an evaluation by a rheumatologist. I would like to refer her to a local rheumatologist. I placed a referral in her chart. Please advise patient.

## 2014-01-26 NOTE — Telephone Encounter (Signed)
Called pt to inform her per Dr. Rexene Alberts that the pt's blood test results, pt's vitamin D level was borderline low and Dr. Rexene Alberts would recommend an over the counter vitamin D supplement, about 2000 units a day and would like for the pt to discuss this with pt's GI Dr. Brantley Stage, also Dr. Rexene Alberts would like for the pt to see a rheumatologist, because pt had rheumatological markers that was positive. I informed the pt that Dr. Rexene Alberts has put a referral in for the pt to see a local rheumatologist. I advised the pt the if she has any other problems, questions or concerns to call the office. Pt verbalized understanding.

## 2014-02-01 ENCOUNTER — Telehealth: Payer: Self-pay | Admitting: Internal Medicine

## 2014-02-01 MED ORDER — MORPHINE SULFATE 15 MG PO TABS: 7.5000 mg | ORAL_TABLET | ORAL | Status: DC | PRN

## 2014-02-01 NOTE — Telephone Encounter (Signed)
Pt having a flare of pancreatitis. Pt needs refill on pain meds. Per Dr. Henrene Pastor ok to refill. Pt aware and script left up front for pick up.

## 2014-02-02 ENCOUNTER — Emergency Department (HOSPITAL_COMMUNITY): Payer: BC Managed Care – PPO

## 2014-02-02 ENCOUNTER — Emergency Department (HOSPITAL_COMMUNITY)
Admission: EM | Admit: 2014-02-02 | Discharge: 2014-02-03 | Disposition: A | Discharge status: Home / Self Care | Payer: BC Managed Care – PPO | Attending: Emergency Medicine | Admitting: Emergency Medicine

## 2014-02-02 ENCOUNTER — Telehealth: Payer: Self-pay | Admitting: Internal Medicine

## 2014-02-02 ENCOUNTER — Encounter (HOSPITAL_COMMUNITY): Payer: Self-pay | Admitting: Emergency Medicine

## 2014-02-02 DIAGNOSIS — Z8701 Personal history of pneumonia (recurrent): Secondary | ICD-10-CM | POA: Insufficient documentation

## 2014-02-02 DIAGNOSIS — K219 Gastro-esophageal reflux disease without esophagitis: Secondary | ICD-10-CM | POA: Insufficient documentation

## 2014-02-02 DIAGNOSIS — Z79899 Other long term (current) drug therapy: Secondary | ICD-10-CM | POA: Insufficient documentation

## 2014-02-02 DIAGNOSIS — Z9089 Acquired absence of other organs: Secondary | ICD-10-CM | POA: Insufficient documentation

## 2014-02-02 DIAGNOSIS — Z9889 Other specified postprocedural states: Secondary | ICD-10-CM | POA: Insufficient documentation

## 2014-02-02 DIAGNOSIS — R1012 Left upper quadrant pain: Secondary | ICD-10-CM | POA: Insufficient documentation

## 2014-02-02 DIAGNOSIS — Z3202 Encounter for pregnancy test, result negative: Secondary | ICD-10-CM | POA: Insufficient documentation

## 2014-02-02 DIAGNOSIS — R110 Nausea: Secondary | ICD-10-CM

## 2014-02-02 DIAGNOSIS — Z87891 Personal history of nicotine dependence: Secondary | ICD-10-CM | POA: Insufficient documentation

## 2014-02-02 DIAGNOSIS — F3289 Other specified depressive episodes: Secondary | ICD-10-CM | POA: Insufficient documentation

## 2014-02-02 DIAGNOSIS — F411 Generalized anxiety disorder: Secondary | ICD-10-CM | POA: Insufficient documentation

## 2014-02-02 DIAGNOSIS — Z8669 Personal history of other diseases of the nervous system and sense organs: Secondary | ICD-10-CM | POA: Insufficient documentation

## 2014-02-02 DIAGNOSIS — K861 Other chronic pancreatitis: Secondary | ICD-10-CM | POA: Insufficient documentation

## 2014-02-02 DIAGNOSIS — IMO0002 Reserved for concepts with insufficient information to code with codable children: Secondary | ICD-10-CM | POA: Insufficient documentation

## 2014-02-02 DIAGNOSIS — Z86718 Personal history of other venous thrombosis and embolism: Secondary | ICD-10-CM | POA: Insufficient documentation

## 2014-02-02 DIAGNOSIS — F329 Major depressive disorder, single episode, unspecified: Secondary | ICD-10-CM | POA: Insufficient documentation

## 2014-02-02 LAB — URINALYSIS, ROUTINE W REFLEX MICROSCOPIC
Bilirubin Urine: NEGATIVE
Glucose, UA: NEGATIVE mg/dL
Ketones, ur: NEGATIVE mg/dL
LEUKOCYTES UA: NEGATIVE
Nitrite: NEGATIVE
PROTEIN: NEGATIVE mg/dL
Specific Gravity, Urine: 1.023 (ref 1.005–1.030)
Urobilinogen, UA: 0.2 mg/dL (ref 0.0–1.0)
pH: 5 (ref 5.0–8.0)

## 2014-02-02 LAB — CBC WITH DIFFERENTIAL/PLATELET
BASOS PCT: 0 % (ref 0–1)
Basophils Absolute: 0 10*3/uL (ref 0.0–0.1)
Eosinophils Absolute: 0 10*3/uL (ref 0.0–0.7)
Eosinophils Relative: 1 % (ref 0–5)
HCT: 38.1 % (ref 36.0–46.0)
Hemoglobin: 13.1 g/dL (ref 12.0–15.0)
LYMPHS ABS: 1.7 10*3/uL (ref 0.7–4.0)
Lymphocytes Relative: 27 % (ref 12–46)
MCH: 30.1 pg (ref 26.0–34.0)
MCHC: 34.4 g/dL (ref 30.0–36.0)
MCV: 87.6 fL (ref 78.0–100.0)
Monocytes Absolute: 0.4 10*3/uL (ref 0.1–1.0)
Monocytes Relative: 6 % (ref 3–12)
NEUTROS PCT: 66 % (ref 43–77)
Neutro Abs: 4.1 10*3/uL (ref 1.7–7.7)
PLATELETS: 281 10*3/uL (ref 150–400)
RBC: 4.35 MIL/uL (ref 3.87–5.11)
RDW: 12.6 % (ref 11.5–15.5)
WBC: 6.2 10*3/uL (ref 4.0–10.5)

## 2014-02-02 LAB — COMPREHENSIVE METABOLIC PANEL
ALT: 18 U/L (ref 0–35)
AST: 19 U/L (ref 0–37)
Albumin: 4.4 g/dL (ref 3.5–5.2)
Alkaline Phosphatase: 95 U/L (ref 39–117)
BUN: 20 mg/dL (ref 6–23)
CALCIUM: 9.7 mg/dL (ref 8.4–10.5)
CO2: 26 meq/L (ref 19–32)
CREATININE: 0.77 mg/dL (ref 0.50–1.10)
Chloride: 102 mEq/L (ref 96–112)
GLUCOSE: 91 mg/dL (ref 70–99)
Potassium: 4 mEq/L (ref 3.7–5.3)
Sodium: 140 mEq/L (ref 137–147)
Total Bilirubin: 0.2 mg/dL — ABNORMAL LOW (ref 0.3–1.2)
Total Protein: 7.5 g/dL (ref 6.0–8.3)

## 2014-02-02 LAB — URINE MICROSCOPIC-ADD ON

## 2014-02-02 LAB — I-STAT TROPONIN, ED: Troponin i, poc: 0.03 ng/mL (ref 0.00–0.08)

## 2014-02-02 LAB — LIPASE, BLOOD: Lipase: 48 U/L (ref 11–59)

## 2014-02-02 LAB — POC URINE PREG, ED: PREG TEST UR: NEGATIVE

## 2014-02-02 MED ORDER — ONDANSETRON HCL 4 MG PO TABS: 4.0000 mg | ORAL_TABLET | Freq: Three times a day (TID) | ORAL | Status: AC | PRN

## 2014-02-02 MED ORDER — IOHEXOL 300 MG/ML  SOLN
50.0000 mL | Freq: Once | INTRAMUSCULAR | Status: AC | PRN
  Administered 2014-02-02: 50 mL via ORAL

## 2014-02-02 MED ORDER — MORPHINE SULFATE 4 MG/ML IJ SOLN
4.0000 mg | Freq: Once | INTRAMUSCULAR | Status: AC
Start: 2014-02-02 — End: 2014-02-02
  Administered 2014-02-02: 4 mg via INTRAVENOUS
  Filled 2014-02-02: qty 1

## 2014-02-02 MED ORDER — OXYCODONE-ACETAMINOPHEN 5-325 MG PO TABS: 1.0000 | ORAL_TABLET | ORAL | Status: DC | PRN

## 2014-02-02 MED ORDER — SODIUM CHLORIDE 0.9 % IV SOLN
Freq: Once | INTRAVENOUS | Status: AC
  Administered 2014-02-02: 21:00:00 via INTRAVENOUS

## 2014-02-02 MED ORDER — ONDANSETRON HCL 4 MG/2ML IJ SOLN
4.0000 mg | Freq: Once | INTRAMUSCULAR | Status: AC
  Administered 2014-02-02: 4 mg via INTRAVENOUS
  Filled 2014-02-02: qty 2

## 2014-02-02 MED ORDER — IOHEXOL 300 MG/ML  SOLN
100.0000 mL | Freq: Once | INTRAMUSCULAR | Status: AC | PRN
  Administered 2014-02-02: 100 mL via INTRAVENOUS

## 2014-02-02 NOTE — ED Notes (Signed)
EKG given to EDP,Goldston,MD. For review. 

## 2014-02-02 NOTE — ED Notes (Signed)
Patient transported to X-ray 

## 2014-02-02 NOTE — ED Notes (Signed)
Patient ambulatory from triage without difficulty or assistance from nursing staff No active N/V at this time Patient appears in NAD Call bell in reach  Patient aware of need for urine specimen

## 2014-02-02 NOTE — Telephone Encounter (Signed)
Spoke with pt and she is aware.

## 2014-02-02 NOTE — ED Notes (Signed)
Patient able to tolerate PO without difficulty.

## 2014-02-02 NOTE — ED Notes (Signed)
Patient back from CXR Patient remains in NAD

## 2014-02-02 NOTE — Telephone Encounter (Signed)
Probably best that she go to the emergency room for evaluation. They can run some tests (blood work including pancreatic enzymes and possibly CT if needed) and administer IV fluids and IV antiemetics. If she responds, may be able to go home. If not, she may need to be admitted. Thanks

## 2014-02-02 NOTE — ED Notes (Signed)
Patient transported to CT 

## 2014-02-02 NOTE — ED Notes (Signed)
Patient asking for both nausea and pain medication EDP made aware

## 2014-02-02 NOTE — ED Notes (Signed)
Patient back from CT Patient remains in NAD 

## 2014-02-02 NOTE — ED Notes (Signed)
Pt sent by doctor for pancreatitis.  C/o LUQ pain x 1 wk w/ vomiting.

## 2014-02-02 NOTE — Discharge Instructions (Signed)
Abdominal (belly) pain can be caused by many things. Your caregiver performed an examination and possibly ordered blood/urine tests and imaging (CT scan, x-rays, ultrasound). Many cases can be observed and treated at home after initial evaluation in the emergency department. Even though you are being discharged home, abdominal pain can be unpredictable. Therefore, you need a repeated exam if your pain does not resolve, returns, or worsens. Most patients with abdominal pain don't have to be admitted to the hospital or have surgery, but serious problems like appendicitis and gallbladder attacks can start out as nonspecific pain. Many abdominal conditions cannot be diagnosed in one visit, so follow-up evaluations are very important. SEEK IMMEDIATE MEDICAL ATTENTION IF: The pain does not go away or becomes severe.  A temperature above 101 develops.  Repeated vomiting occurs (multiple episodes).  The pain becomes localized to portions of the abdomen. The right side could possibly be appendicitis. In an adult, the left lower portion of the abdomen could be colitis or diverticulitis.  Blood is being passed in stools or vomit (bright red or black tarry stools).  Return also if you develop chest pain, difficulty breathing, dizziness or fainting, or become confused, poorly responsive, or inconsolable (young children).  Your labs showed no abnormalities.Your CT scan is essentially unchanged. Please follow up with Dr Henrene Pastor as soon as possible.   Abdominal Pain Many things can cause abdominal pain. Usually, abdominal pain is not caused by a disease and will improve without treatment. It can often be observed and treated at home. Your health care provider will do a physical exam and possibly order blood tests and X-rays to help determine the seriousness of your pain. However, in many cases, more time must pass before a clear cause of the pain can be found. Before that point, your health care provider may not know if  you need more testing or further treatment. HOME CARE INSTRUCTIONS  Monitor your abdominal pain for any changes. The following actions may help to alleviate any discomfort you are experiencing:  Only take over-the-counter or prescription medicines as directed by your health care provider.  Do not take laxatives unless directed to do so by your health care provider.  Try a clear liquid diet (broth, tea, or water) as directed by your health care provider. Slowly move to a bland diet as tolerated. SEEK MEDICAL CARE IF:  You have unexplained abdominal pain.  You have abdominal pain associated with nausea or diarrhea.  You have pain when you urinate or have a bowel movement.  You experience abdominal pain that wakes you in the night.  You have abdominal pain that is worsened or improved by eating food.  You have abdominal pain that is worsened with eating fatty foods.  You have a fever. SEEK IMMEDIATE MEDICAL CARE IF:   Your pain does not go away within 2 hours.  You keep throwing up (vomiting).  Your pain is felt only in portions of the abdomen, such as the right side or the left lower portion of the abdomen.  You pass bloody or black tarry stools. MAKE SURE YOU:  Understand these instructions.   Will watch your condition.   Will get help right away if you are not doing well or get worse.  Document Released: 05/08/2005 Document Revised: 08/03/2013 Document Reviewed: 04/07/2013 [facility] Patient Information 2015 [place], [place]. This information is not intended to replace advice given to you by your health care provider. Make sure you discuss any questions you have with your health care  provider.

## 2014-02-02 NOTE — Telephone Encounter (Signed)
Pt called for pain medication yesterday and states the pain is better today. Pt states that she is having nausea and vomiting and the phenergan is not helping. Also states she is shaking, wonders if she is dehydrated. Pt states the vomiting started about 55min ago. Dr.  Henrene Pastor please advise.

## 2014-02-02 NOTE — ED Provider Notes (Signed)
CSN: [id number]     Arrival date & time 02/02/14  1701 History   First MD Initiated Contact with Patient 02/02/14 1929     Chief Complaint  Patient presents with  . Pancreatitis     (Consider location/radiation/quality/duration/timing/severity/associated sxs/prior Treatment) The history is provided by the patient and medical records.   Cassie Berry is a(n) 48 y.o. female who presents to the ED with cc of LUQ abdominal pain. The patient is status post ERCP in 2013. She has had chronic pancreatitis since that time. Patient is followed by Dr. Henrene Pastor. Patient states that she has had one week of worsening left upper quadrant pain. She describes it as sharp under her rib cage, radiating to the epigastrium, left shoulder and left elbow. She states that it has been worsening over the weekend. She called Dr. Earnie Larsson and out of her pain medications prescribed as well as her anti-emetics however her pain has worsened and has been unrelieved at home with his medications. Patient states that this is similar to her previous episodes of pancreatitis. She has been unable to hold down food or fluids at home. Patient denies any history of hypertension, hyperlipidemia. She has 5 pack year smoking history. She denies family history of acute coronary syndrome. Nondiabetic.  Denies fevers, chills, myalgias, arthralgias. Denies DOE, SOB, chest tightness or pressure, radiation to left arm, jaw or back, or diaphoresis. Denies dysuria, flank pain, suprapubic pain, frequency, urgency, or hematuria. Denies headaches, light headedness, weakness, visual disturbances. Denies  diarrhea or constipation.    Past Medical History  Diagnosis Date  . GERD (gastroesophageal reflux disease)   . Anxiety   . Migraine headache   . PONV (postoperative nausea and vomiting)   . Pneumonia     02/2011  . Pancreatitis   . Duodenal stenosis   . DVT (deep venous thrombosis)   . Duodenitis   . Depression   . ADD (attention deficit  disorder)   . Seizures     pt thinks she may have had febrile seizure in hospital last year june 2013  . Diverticulosis    Past Surgical History  Procedure Laterality Date  . Appendectomy    . Cholecystectomy    . Ercp  01/23/2012    Procedure: ENDOSCOPIC RETROGRADE CHOLANGIOPANCREATOGRAPHY (ERCP);  Surgeon: Inda Castle, MD;  Location: [facility];  Service: Endoscopy;  Laterality: N/A;  . Sphincterotomy  01/23/2012    Procedure: SPHINCTEROTOMY;  Surgeon: Inda Castle, MD;  Location: [facility];  Service: Endoscopy;  Laterality: N/A;  . Esophagogastroduodenoscopy  01/25/2012    Procedure: ESOPHAGOGASTRODUODENOSCOPY (EGD);  Surgeon: Milus Banister, MD;  Location: [facility];  Service: Endoscopy;  Laterality: N/A;  . Esophagogastroduodenoscopy  02/20/2012    Procedure: ESOPHAGOGASTRODUODENOSCOPY (EGD);  Surgeon: Ladene Artist, MD,FACG;  Location: [facility];  Service: Endoscopy;  Laterality: N/A;  ?feeding tube placement at same time post pyloric tube   Family History  Problem Relation Age of Onset  . Colon cancer Cousin     Paternal side  . Colon cancer Paternal Grandfather   . Lung cancer Mother   . Diabetes Father   . Heart disease Father   . Prostate cancer Paternal Grandfather   . Breast cancer Maternal Aunt   . Diabetes Sister   . Hypertension Brother   . Hyperlipidemia Brother   . Hypertension Father    History  Substance Use Topics  . Smoking status: Former Smoker    Types: Cigarettes    Quit date: 08/12/1988  .  Smokeless tobacco: Never Used  . Alcohol Use: Yes     Comment: Occasional   OB History   Grav Para Term Preterm Abortions TAB SAB Ect Mult Living                 Review of Systems  Ten systems reviewed and are negative for acute change, except as noted in the HPI.    Allergies  Macrodantin; Dilaudid; and Nitrofurantoin  Home Medications   Prior to Admission medications   Medication Sig Start Date End Date Taking? Authorizing  Artez Regis  buPROPion (WELLBUTRIN XL) 300 MG 24 hr tablet Take 300 mg by mouth every morning.    Yes Historical Deyci Gesell, MD  cetirizine (ZYRTEC ALLERGY) 10 MG tablet Take 10 mg by mouth daily.   Yes Historical Bilan Tedesco, MD  cholecalciferol (VITAMIN D) 1000 UNITS tablet Take 2,000 Units by mouth daily.   Yes Historical Mckaylie Vasey, MD  dicyclomine (BENTYL) 10 MG capsule Take 10 mg by mouth 2 (two) times daily as needed for spasms.   Yes Historical Jene Huq, MD  fluticasone (FLONASE) 50 MCG/ACT nasal spray Place 1 spray into the nose daily.   Yes Historical Tauni Sanks, MD  LORazepam (ATIVAN) 0.5 MG tablet Take 1 mg by mouth at bedtime.    Yes Historical Pairlee Sawtell, MD  morphine (MSIR) 15 MG tablet Take 7.5 mg by mouth every 4 (four) hours as needed for severe pain.   Yes Historical Jazmeen Axtell, MD  Multiple Vitamin (MULTIVITAMIN WITH MINERALS) TABS tablet Take 1 tablet by mouth daily.   Yes Historical Shauniece Kwan, MD  Pancrelipase, Lip-Prot-Amyl, 36000 UNITS CPEP Take 2 capsules by mouth 3 (three) times daily before meals. 08/19/13  Yes Irene Shipper, MD  pantoprazole (PROTONIX) 40 MG tablet Take 1 tablet by mouth daily. 01/10/14  Yes Historical Nica Friske, MD  promethazine (PHENERGAN) 25 MG tablet Take 1 tablet (25 mg total) by mouth every 6 (six) hours as needed for nausea. 07/07/13  Yes Irene Shipper, MD  Suvorexant (BELSOMRA) 10 MG TABS Take 10 mg by mouth daily as needed (for sleep).   Yes Historical Joab Carden, MD   BP 113/75  Pulse 78  Temp(Src) 98.2 F (36.8 C) (Oral)  Resp 13  SpO2 97%  LMP 12/11/2010 Physical Exam Physical Exam  Nursing note and vitals reviewed. Constitutional: She is oriented to person, place, and time. She appears well-developed and well-nourished. No distress.  HENT:  Head: Normocephalic and atraumatic.  Eyes: Conjunctivae normal and EOM are normal. Pupils are equal, round, and reactive to light. No scleral icterus.  Neck: Normal range of motion.  Cardiovascular: Normal rate, regular  rhythm and normal heart sounds.  Exam reveals no gallop and no friction rub.   No murmur heard. Pulmonary/Chest: Effort normal and breath sounds normal. No respiratory distress.  Abdominal: Mild ttp LUQ, Nomal bowel sounds. No CVA tenderness Neurological: She is alert and oriented to person, place, and time.  Skin: Skin is warm and dry. She is not diaphoretic.  Psych: flat affect   ED Course  Procedures (including critical care time) Labs Review Labs Reviewed  COMPREHENSIVE METABOLIC PANEL - Abnormal; Notable for the following:    Total Bilirubin 0.2 (*)    All other components within normal limits  URINALYSIS, ROUTINE W REFLEX MICROSCOPIC - Abnormal; Notable for the following:    Hgb urine dipstick TRACE (*)    All other components within normal limits  URINE MICROSCOPIC-ADD ON - Abnormal; Notable for the following:    Bacteria, UA MANY (*)  All other components within normal limits  CBC WITH DIFFERENTIAL  LIPASE, BLOOD  POC URINE PREG, ED  Randolm Idol, ED    Imaging Review Dg Chest 2 View  02/02/2014   CLINICAL DATA:  Chest pain.  EXAM: CHEST  2 VIEW  COMPARISON:  02/06/2012  FINDINGS: The heart size and mediastinal contours are within normal limits. Both lungs are clear. The visualized skeletal structures are unremarkable.  IMPRESSION: Normal chest.   Electronically Signed   By: Rozetta Nunnery M.D.   On: 02/02/2014 20:51     EKG Interpretation None     ECG interpretation   Date: 02/02/2014  Rate: 61  Rhythm: normal sinus rhythm  QRS Axis: normal  Intervals: normal  ST/T Wave abnormalities: normal  Conduction Disutrbances: none  Narrative Interpretation:   Old EKG Reviewed: No significant changes noted     MDM   Final diagnoses:  Left upper quadrant pain  Nausea   BP 113/75  Pulse 78  Temp(Src) 98.2 F (36.8 C) (Oral)  Resp 13  SpO2 97%  LMP 12/11/2010  Patient with normal ECG, negative tropnin. Labs are unremarkable I will CT to r/o  diverticulitis or other intraabdominal etiology of her pain.   11:26 PM BP 113/75  Pulse 78  Temp(Src) 98.2 F (36.8 C) (Oral)  Resp 13  SpO2 97%  LMP 12/11/2010 Patient is nontoxic, nonseptic appearing, in no apparent distress.  Patient's pain and other symptoms adequately managed in emergency department.  Fluid bolus given.  Labs, imaging and vitals reviewed.  Patient does not meet the SIRS or Sepsis criteria.  On repeat exam patient does not have a surgical abdomin and there are nor peritoneal signs.  No indication of appendicitis, bowel obstruction, bowel perforation, cholecystitis, diverticulitis.  Patient discharged home with symptomatic treatment and given strict instructions for follow-up with their primary care physician.  I have also discussed reasons to return immediately to the ER.  Patient expresses understanding and agrees with plan.     Margarita Mail, PA-C 02/03/14 1120

## 2014-02-03 ENCOUNTER — Other Ambulatory Visit: Payer: BC Managed Care – PPO

## 2014-02-03 ENCOUNTER — Telehealth: Payer: Self-pay

## 2014-02-03 NOTE — ED Provider Notes (Signed)
Medical screening examination/treatment/procedure(s) were performed by non-physician practitioner and as supervising physician I was immediately available for consultation/collaboration.   EKG Interpretation None        Blanchie Dessert, MD 02/03/14 2350

## 2014-02-03 NOTE — Telephone Encounter (Signed)
Spoke with pt and she is aware. States she is still very tired and has not eaten anything yet today. She is able to drink fluids and is keeping them down. States the pain has eased off. Instructed pt to call us back if she needed anything further. Dr. Henrene Pastor notified.

## 2014-02-03 NOTE — Telephone Encounter (Signed)
Message copied by Algernon Huxley on Thu Feb 03, 2014 10:46 AM ------      Message from: Irene Shipper      Created: Thu Feb 03, 2014  9:50 AM      Regarding: Follow up call       Please let Cassie Berry know that I reviewed her ER visit, labs, and CT. No obvious acute pancreatitis or other problem. Just wanted to let her know that I reviewed things and wanted to see how she's feeling today. Thanks  ------

## 2014-02-22 ENCOUNTER — Telehealth: Payer: Self-pay | Admitting: *Deleted

## 2014-02-22 NOTE — Telephone Encounter (Signed)
Called patient, received fax with appointment details for Dr. Melissa Noon office left message of appointment on 04/06/2014 at 1:30 pm, also gave patient the address and phone number to location and instructed her to call them with any schedule changes as well as to call us back with any questions or concerns.

## 2014-04-22 IMAGING — US US ABDOMEN COMPLETE
1 series · 14 of 25 positions shown · non-contrast
Comparison: None.

CLINICAL DATA: Epigastric pain, right quadrant pain

COMPLETE ABDOMINAL ULTRASOUND

[Series 1: us abdomen complete · 0.32mm/px · 14 of 63 slices shown]
[im 1/63]
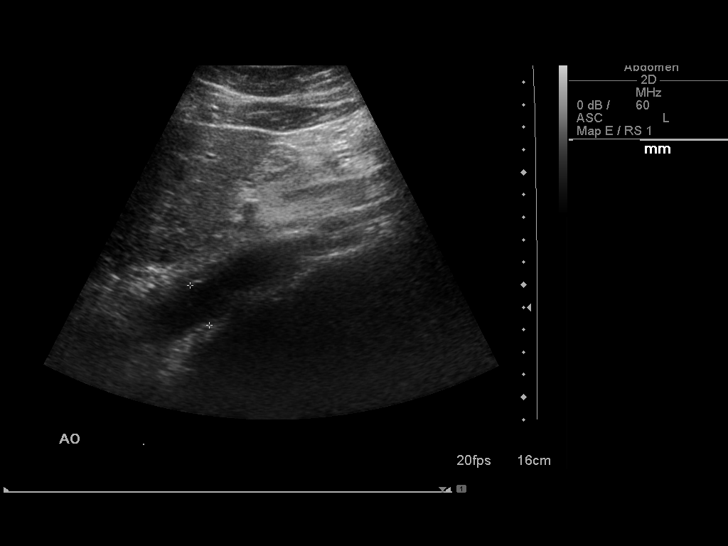
[im 6/63]
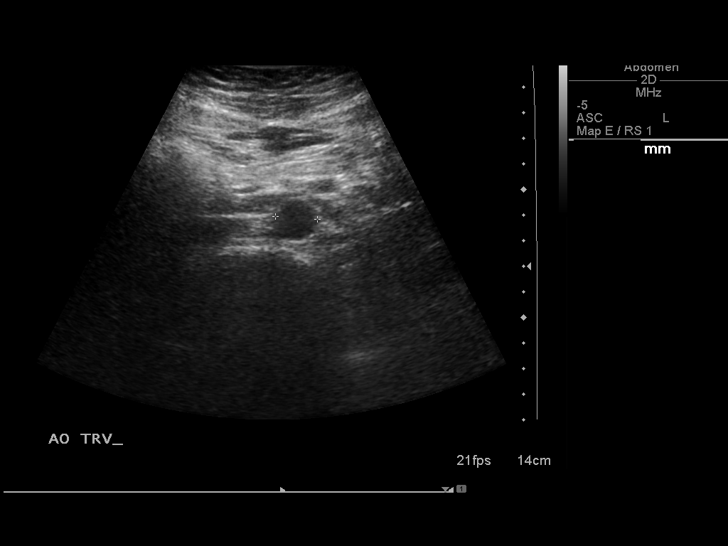
[im 11/63]
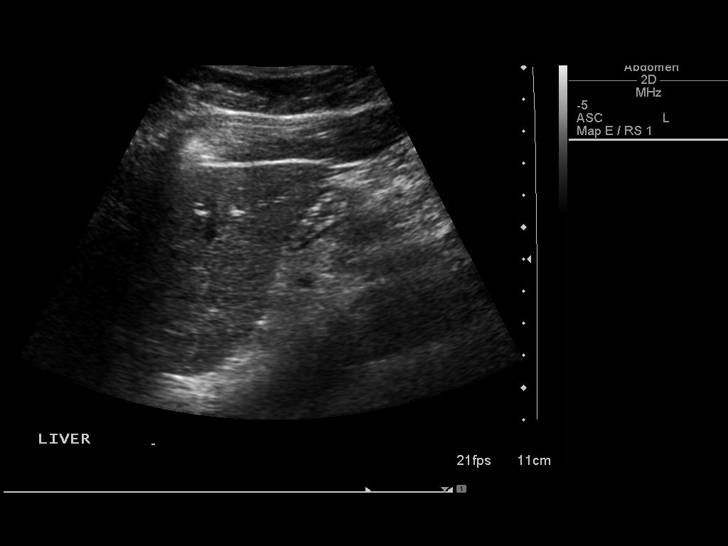
[im 16/63]
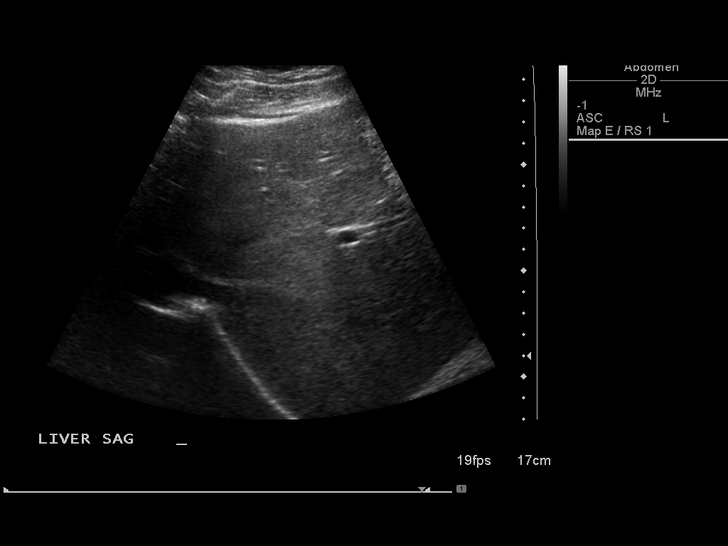
[im 21/63]
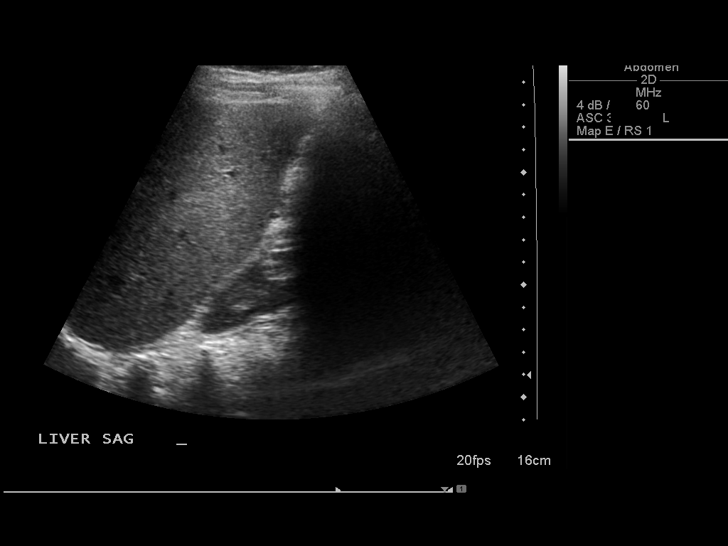
[im 24/63]
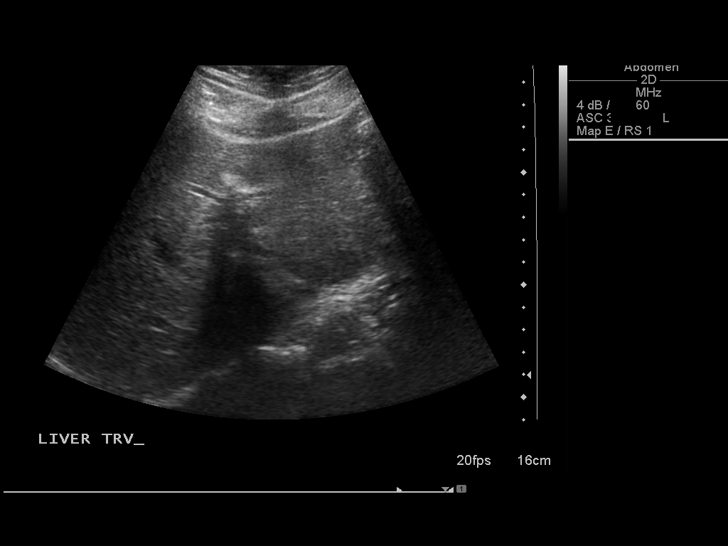
[im 29/63]
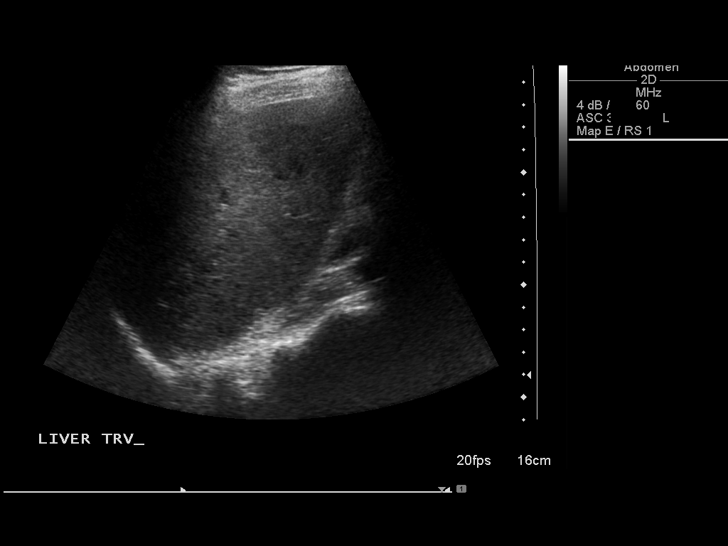
[im 34/63]
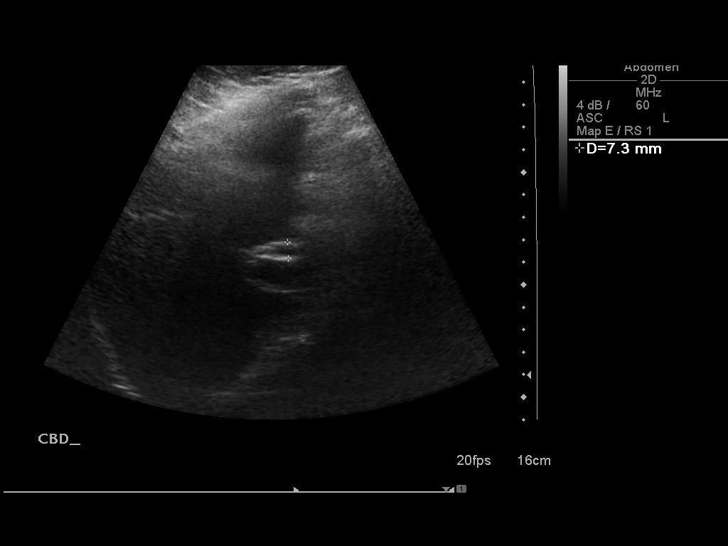
[im 39/63]
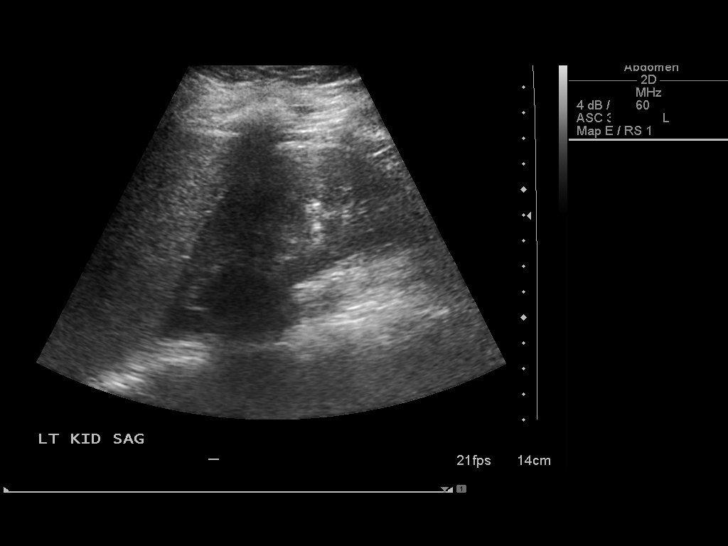
[im 42/63]
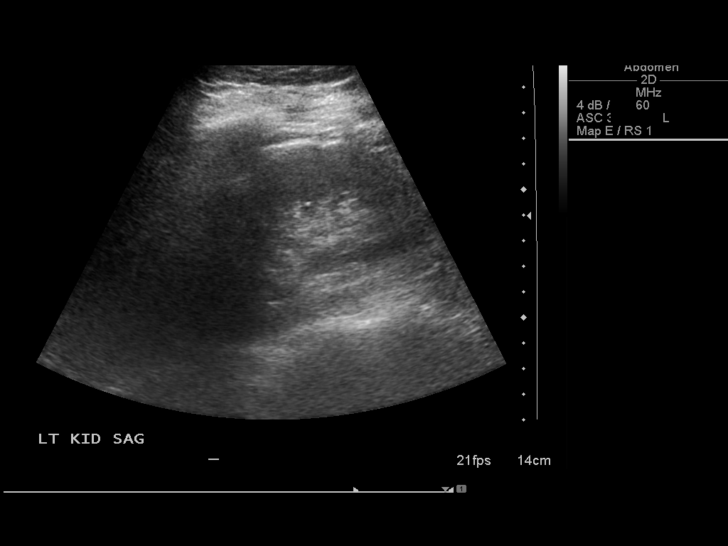
[im 47/63]
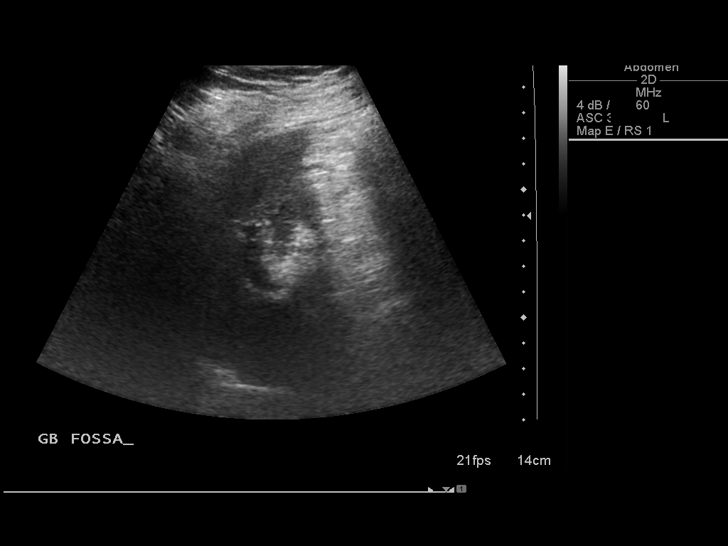
[im 52/63]
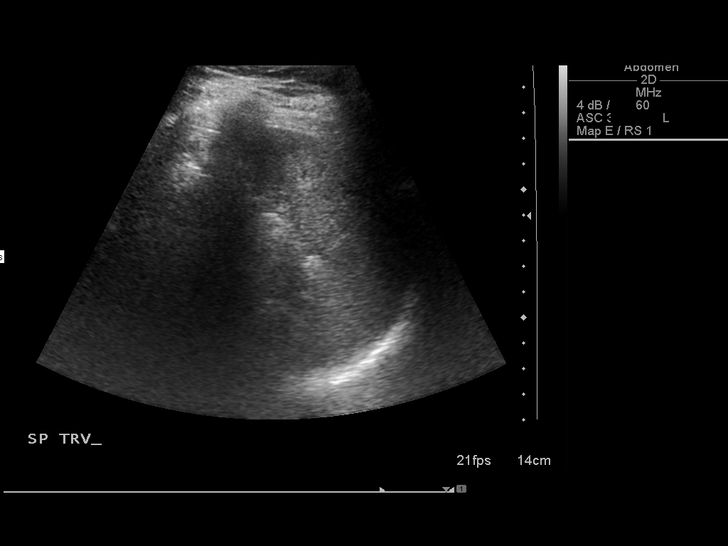
[im 57/63]
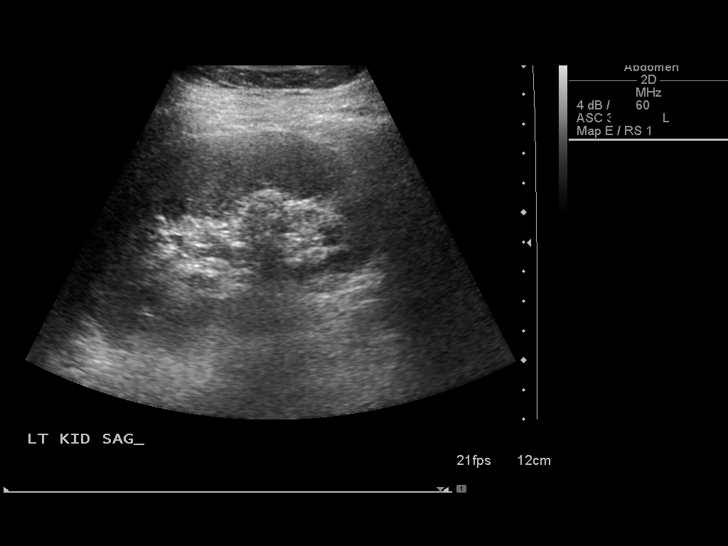
[im 63/63]
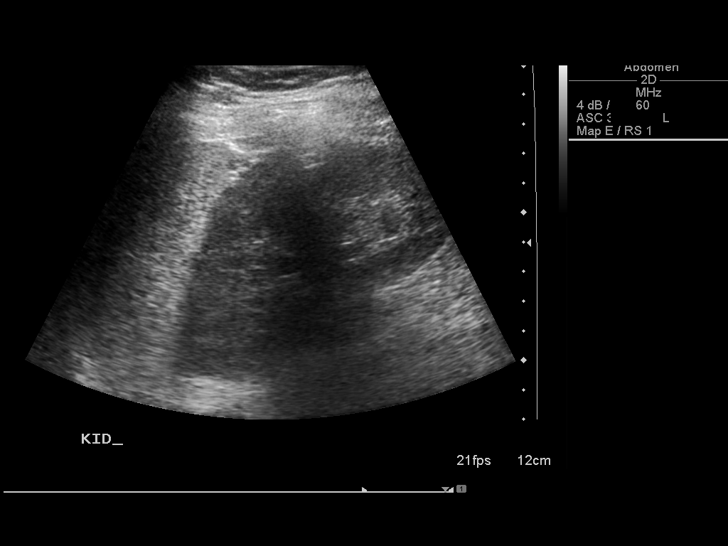

[14 of 25 positions shown; findings below may reference images not displayed]

FINDINGS: Gallbladder:  Surgically absent

Common bile duct:  Normal diameter for post cholecystectomy at 7 mm

Liver:  No focal lesion identified.  Within normal limits in
parenchymal echogenicity.

IVC:  Appears normal.

Pancreas:  No focal abnormality seen.

Spleen:  Normal size and echogenicity.

Right Kidney:  11.2cm in length.  No evidence of hydronephrosis or
stones.

Left Kidney:  11.2 andcm in length.  No evidence of hydronephrosis
or stones.

Abdominal aorta:  No aneurysm identified.
IMPRESSION: 1..  Post cholecystectomy.
2.  No acute findings.
3.  Common bile duct within  normal limits in diameter for post
cholecystectomy.

## 2014-04-27 ENCOUNTER — Ambulatory Visit (INDEPENDENT_AMBULATORY_CARE_PROVIDER_SITE_OTHER): Payer: BC Managed Care – PPO

## 2014-04-27 ENCOUNTER — Telehealth: Payer: Self-pay | Admitting: *Deleted

## 2014-04-27 DIAGNOSIS — R5381 Other malaise: Secondary | ICD-10-CM

## 2014-04-27 DIAGNOSIS — R5383 Other fatigue: Principal | ICD-10-CM

## 2014-04-27 DIAGNOSIS — T50905A Adverse effect of unspecified drugs, medicaments and biological substances, initial encounter: Secondary | ICD-10-CM

## 2014-04-27 DIAGNOSIS — R531 Weakness: Secondary | ICD-10-CM

## 2014-04-27 DIAGNOSIS — K861 Other chronic pancreatitis: Secondary | ICD-10-CM

## 2014-04-27 DIAGNOSIS — R569 Unspecified convulsions: Secondary | ICD-10-CM

## 2014-04-27 DIAGNOSIS — K859 Acute pancreatitis without necrosis or infection, unspecified: Secondary | ICD-10-CM

## 2014-04-27 DIAGNOSIS — R413 Other amnesia: Secondary | ICD-10-CM

## 2014-04-27 MED ORDER — GADOPENTETATE DIMEGLUMINE 469.01 MG/ML IV SOLN: 17.0000 mL | Freq: Once | INTRAVENOUS | Status: AC | PRN

## 2014-04-27 NOTE — Telephone Encounter (Signed)
Pt here for MRI, contrast given IV, MRI tech contacted me about pt had reaction post MRI contrast of feeling dizzy, vision problems.  I saw pt sitting in chair near MRI techs desk at 1216.  I accompanied her to room 15, VS 129/83, HR 62 O2 sat 98% RR 16, temp oral 98.1.  Made comfortable in chair.  She had eaten only apple this am.  Sx dizziness, bright lights (floaters (bilaterally), headache all over. Level 3-4.  (like beginning of migraine).  Dr. Rexene Alberts notified.  Push fluids, stay for 30 minutes. Gave her juice, water, crackers.  She needed her pancreatic medication.  She took prior to eating.   Recliner placed in room for her to sit comfortably.   Report given to Butch Penny, RN. VS retaken per Butch Penny RN, 1307 124/79, HR 64.  She left at 1317, with husband.

## 2014-04-28 NOTE — Progress Notes (Signed)
Quick Note:  Please call and advise patient that her recent brain MRI with and without contrast was reported as normal. Star Age, MD, PhD [facility] ([place])  ______

## 2014-04-29 NOTE — Progress Notes (Signed)
Quick Note:  Shared normal MR Brain results with patient, verbalized understanding ______

## 2014-05-20 IMAGING — RF DG ERCP WO/W SPHINCTEROTOMY
5 series · 5 of 5 positions shown · non-contrast
Comparison: 12/26/2011 ultrasound, 09/04/2009 MRCP

***ADDENDUM*** CREATED: 02/03/2012 [DATE]

Correction to report findings:  Initial images demonstrate a
radiopaque stent placed within the pancreatic duct not the common
bile duct.  Findings discussed with Dr. Tiger.
***END ADDENDUM*** SIGNED BY: Tiia-Sofia Aakala, M.D.
CLINICAL DATA: Choledocholithiasis, epigastric pain
ERCP with sphincterotomy
TECHNIQUE: Multiple spot images obtained with the fluoroscopic
device and submitted for interpretation post-procedure.  ERCP was
performed by Dr. Tiger.

[Series 5: dr · 1 of 1 slices shown (1 of 5)]
[im 1/1]
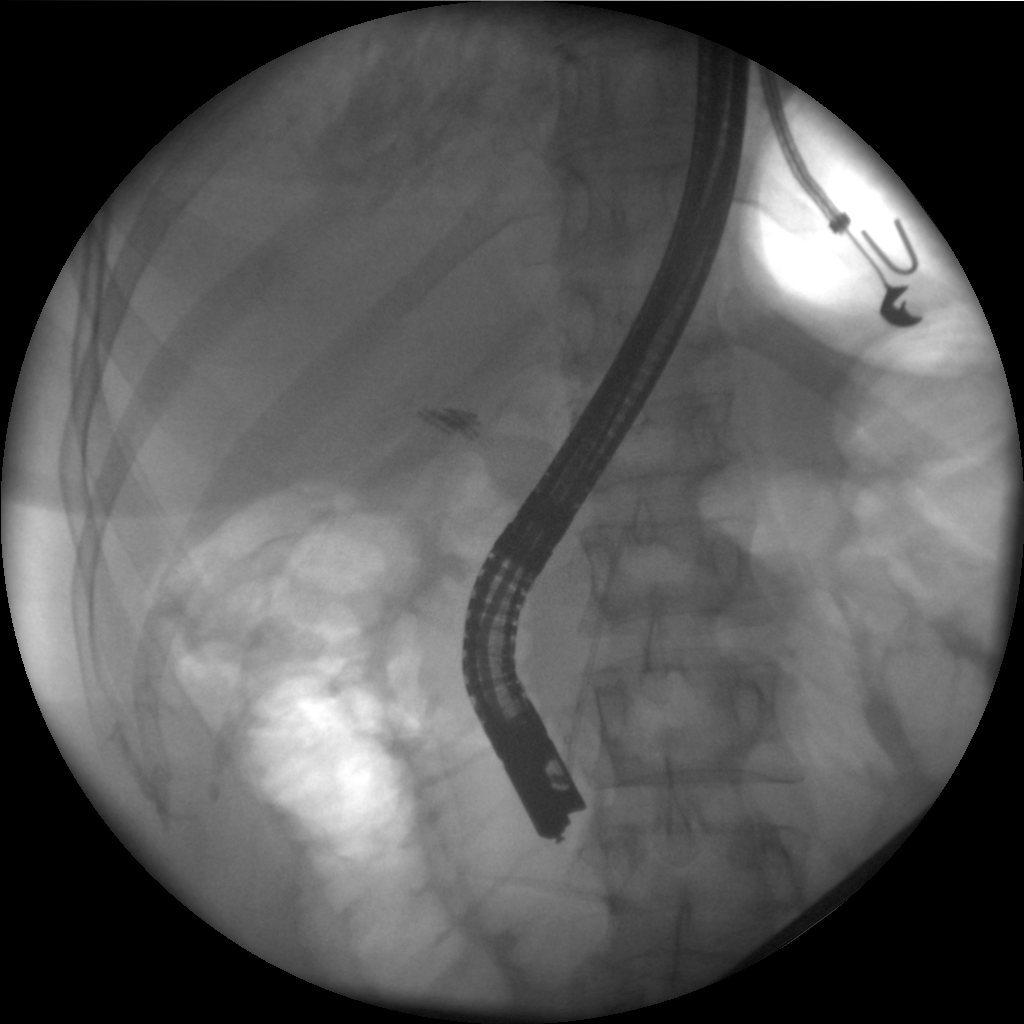

[Series 6: dr · 1 of 1 slices shown (2 of 5)]
[im 1/1]
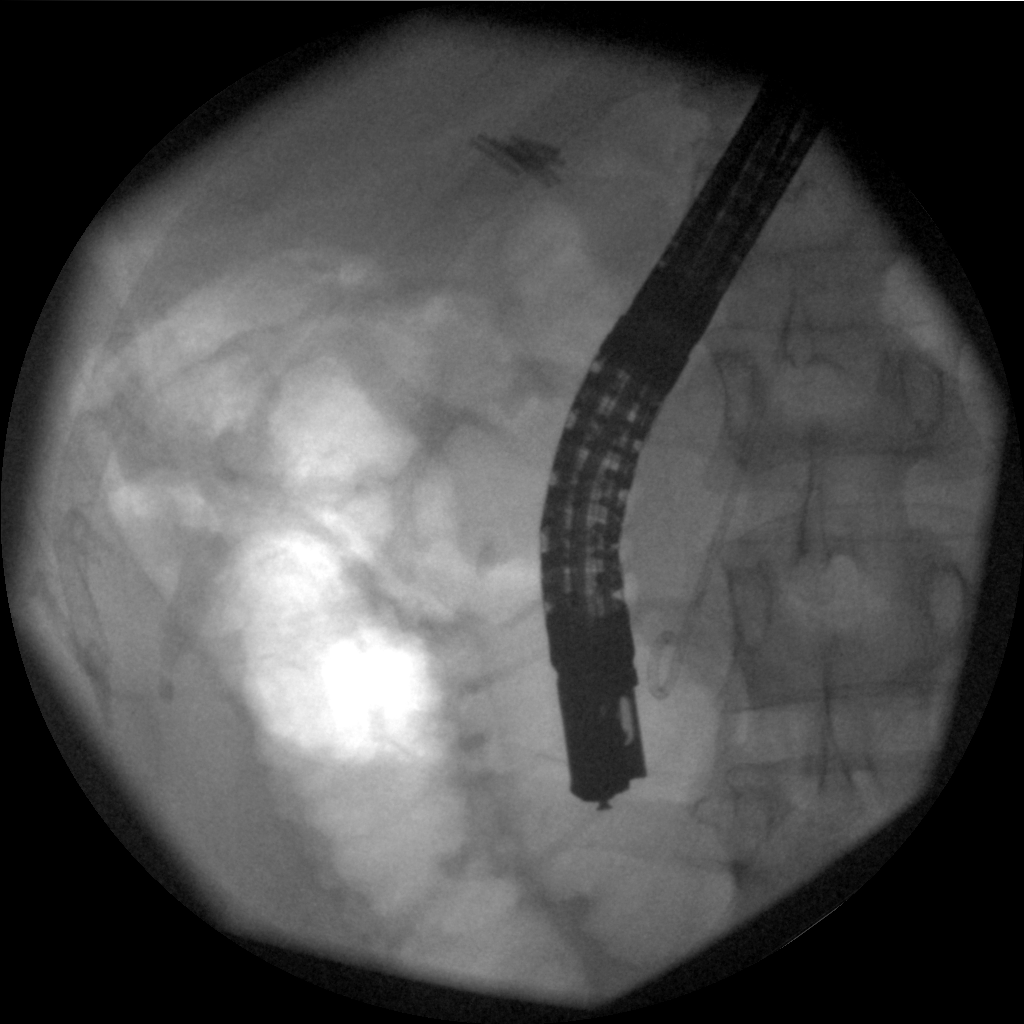

[Series 7: dr · 1 of 1 slices shown (3 of 5)]
[im 1/1]
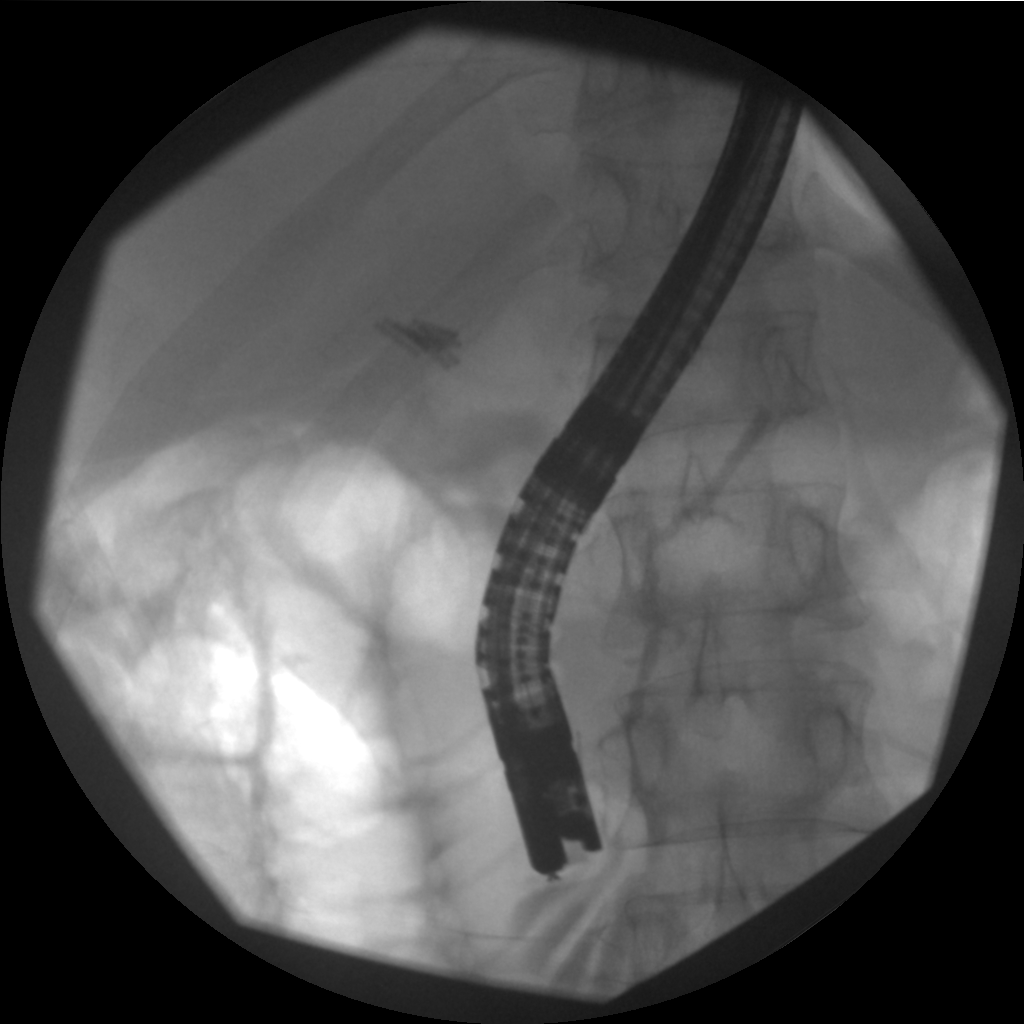

[Series 12: dr · 1 of 1 slices shown (4 of 5)]
[im 1/1]
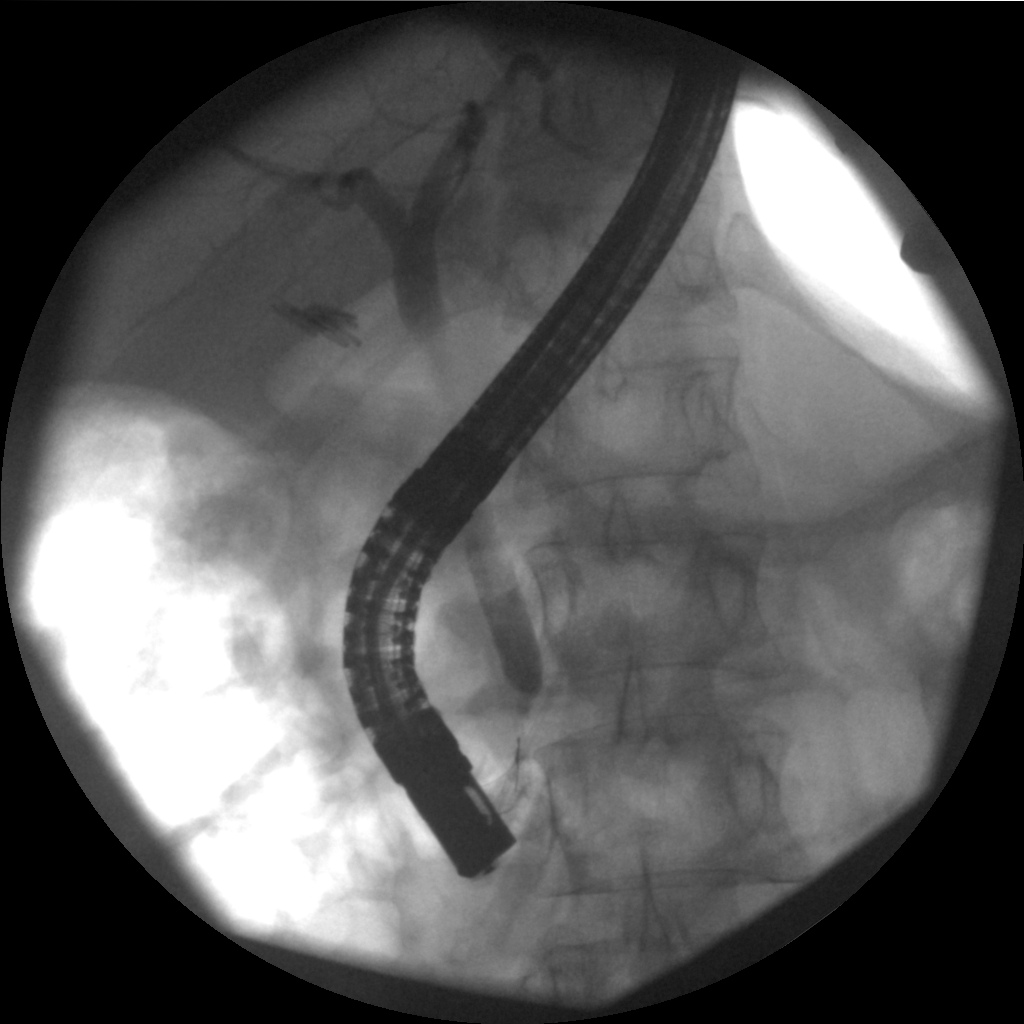

[Series 13: dr · 1 of 1 slices shown (5 of 5)]
[im 1/1]
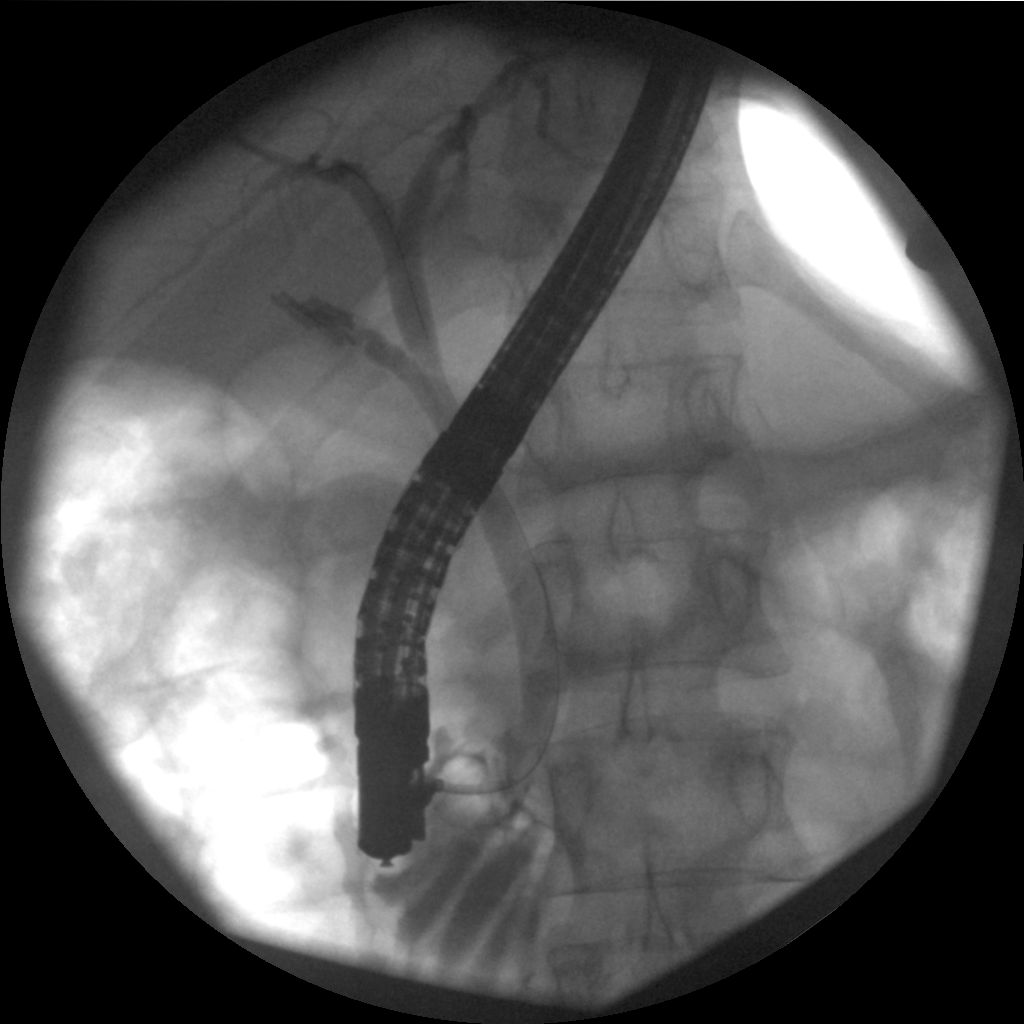

[5 of 5 positions shown; findings below may reference images not displayed]

FINDINGS: four spot fluoroscopic images obtained during ERCP.
Initial image demonstrates a radiopaque biliary stent.  Prior
cholecystectomy evident.  Common bile duct cannulation was
performed for a sphincterotomy and cholangiogram.  Final image
demonstrates patent biliary system without significant dilatation
or obstruction following balloon sweep and sphincterotomy.
IMPRESSION: Biliary system appears patent after sphincterotomy and balloon
sweep.

These images were submitted for radiologic interpretation only.
Please see the procedural report for the amount of contrast and the
fluoroscopy time utilized.

## 2014-05-27 ENCOUNTER — Other Ambulatory Visit: Payer: Self-pay

## 2014-06-28 ENCOUNTER — Encounter: Payer: Self-pay | Admitting: Neurology

## 2014-06-28 ENCOUNTER — Ambulatory Visit (INDEPENDENT_AMBULATORY_CARE_PROVIDER_SITE_OTHER): Payer: BC Managed Care – PPO | Admitting: Neurology

## 2014-06-28 VITALS — BP 108/73 | HR 72 | Temp 98.6°F | Ht 66.0 in | Wt 191.0 lb

## 2014-06-28 DIAGNOSIS — M791 Myalgia, unspecified site: Secondary | ICD-10-CM

## 2014-06-28 DIAGNOSIS — R413 Other amnesia: Secondary | ICD-10-CM

## 2014-06-28 DIAGNOSIS — R531 Weakness: Secondary | ICD-10-CM

## 2014-06-28 NOTE — Progress Notes (Signed)
Subjective:    Patient ID: Cassie Berry is a 48 y.o. female.  HPI     Interim history:   Cassie Berry is a 48 year old right-handed woman with a complex medical history of recurrent pancreatitis, DVT, pneumonia in July 2012, ADD, history of migraines with aura, allergic rhinitis, anxiety, status post cholecystectomy, appendectomy, ERCP with sphincterectomy and stent placement in July 2013, reflux disease, who presents for follow-up consultation of her intermittent muscle weakness and fatigue and forgetfulness. She is unaccompanied today. I first met her on 01/20/2014, at which time she was referred by her primary care physician and reported intermittent muscle weakness and fatigue and forgetfulness for the last 3 years. She reported lack of energy and denied focal weakness, atrophy, fasciculations, abnormal twitching except for muscle cramps at night.   I suggested further workup in the form of EEG, brain MRI with and without contrast and blood work. She had a positive ANA and positive SSB antibody. Vitamin D level was borderline at 28.5. CK was 190. CRP mildly elevated. B12 and folate and RPR as well as hemoglobin A1c and ESR as well as TSH were normal.  She had a normal brain MRI without contrast on 04/27/2014. She could not tolerate the IV contrast. She had an EEG on 01/24/2014 which was reported as normal in the awake state. I suggested we proceed with a rheumatology consult. She was scheduled with Dr. Amil Amen on 04/06/2014.  Today, she reports feeling a little better overall, she no longer takes phenergan. She saw Dr. Amil Amen, and no new test was needed. She has no routine follow up scheduled with him. She feels a little better. She started seeing Dr. Harl Bowie at [facility] clinic.   She has had difficulty with her sleep since 2013, and has been on Ativan for sleep. She has been given Belsomra samples, but has not started it yet. Never had TIA or stroke symptoms, denying sudden onset of  one sided weakness, numbness, tingling, slurring of speech or droopy face, hearing loss, tinnitus, diplopia or visual field cut or monocular loss of vision, and denies recurrent headaches.   She states, she had a seizure in 2013 in the context of fever and being on Dilaudid and also reported hallucinations on Dilauded, which was therefore changed to morphine. She has prn MSIR 15 mg available for pain with flare up of her pancreatitis. She feels she is forgetful. She is not paying attention very well. She is finding it difficult to multitask or stay on task.  Her Past Medical History Is Significant For: Past Medical History  Diagnosis Date  . GERD (gastroesophageal reflux disease)   . Anxiety   . Migraine headache   . PONV (postoperative nausea and vomiting)   . Pneumonia     02/2011  . Pancreatitis   . Duodenal stenosis   . DVT (deep venous thrombosis)   . Duodenitis   . Depression   . ADD (attention deficit disorder)   . Seizures     pt thinks she may have had febrile seizure in hospital last year june 2013  . Diverticulosis     Her Past Surgical History Is Significant For: Past Surgical History  Procedure Laterality Date  . Appendectomy    . Cholecystectomy    . Ercp  01/23/2012    Procedure: ENDOSCOPIC RETROGRADE CHOLANGIOPANCREATOGRAPHY (ERCP);  Surgeon: Inda Castle, MD;  Location: [facility];  Service: Endoscopy;  Laterality: N/A;  . Sphincterotomy  01/23/2012    Procedure: SPHINCTEROTOMY;  Surgeon:  Inda Castle, MD;  Location: [facility];  Service: Endoscopy;  Laterality: N/A;  . Esophagogastroduodenoscopy  01/25/2012    Procedure: ESOPHAGOGASTRODUODENOSCOPY (EGD);  Surgeon: Milus Banister, MD;  Location: [facility];  Service: Endoscopy;  Laterality: N/A;  . Esophagogastroduodenoscopy  02/20/2012    Procedure: ESOPHAGOGASTRODUODENOSCOPY (EGD);  Surgeon: Ladene Artist, MD,FACG;  Location: [facility];  Service: Endoscopy;  Laterality: N/A;  ?feeding tube placement  at same time post pyloric tube    Her Family History Is Significant For: Family History  Problem Relation Age of Onset  . Colon cancer Cousin     Paternal side  . Colon cancer Paternal Grandfather   . Lung cancer Mother   . Diabetes Father   . Heart disease Father   . Prostate cancer Paternal Grandfather   . Breast cancer Maternal Aunt   . Diabetes Sister   . Hypertension Brother   . Hyperlipidemia Brother   . Hypertension Father     Her Social History Is Significant For: History   Social History  . Marital Status: Married    Spouse Name: Ron    Number of Children: 2  . 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 Smoking status: Former Smoker    Types: Cigarettes    Quit date: 08/12/1988  . Smokeless tobacco: Never Used  . Alcohol Use: Yes     Comment: Occasional  . Drug Use: No  . Sexual Activity: None   Other Topics Concern  . None   Social History Narrative   Daily caffeine    Her Allergies Are:  Allergies  Allergen Reactions  . Macrodantin Rash  . Dilaudid [Hydromorphone Hcl] Other (See Comments)    Hallucinations   . Nitrofurantoin Rash  :   Her Current Medications Are:  Outpatient Encounter Prescriptions as of 06/28/2014  Medication Sig  . buPROPion (WELLBUTRIN XL) 300 MG 24 hr tablet Take 300 mg by mouth every morning.   . cetirizine (ZYRTEC ALLERGY) 10 MG tablet Take 10 mg by mouth daily.  . cholecalciferol (VITAMIN D) 1000 UNITS tablet Take 2,000 Units by mouth daily. Does not take every day  . dicyclomine (BENTYL) 10 MG capsule Take 10 mg by mouth 2 (two) times daily as needed for spasms.  . fluticasone (FLONASE) 50 MCG/ACT nasal spray Place 1 spray into the nose daily.  Marland Kitchen LORazepam (ATIVAN) 0.5 MG tablet Take 1 mg by mouth at bedtime. As needed  . magnesium citrate SOLN Take 1 Bottle by mouth daily as needed for severe constipation.  Marland Kitchen morphine (MSIR) 15 MG tablet Take 7.5 mg by  mouth every 4 (four) hours as needed for severe pain.  Marland Kitchen ondansetron (ZOFRAN) 4 MG tablet Take 1 tablet (4 mg total) by mouth every 8 (eight) hours as needed for nausea or vomiting.  . Pancrelipase, Lip-Prot-Amyl, 36000 UNITS CPEP Take 2 capsules by mouth 3 (three) times daily. 5 cap. Three times daily before meals,20 min. Before meals, 2-3 cap. With snacks.  . pantoprazole (PROTONIX) 40 MG tablet Take 1 tablet by mouth daily.  . Polyethylene Glycol 3350 (MIRALAX PO) Take by mouth daily as needed.  . promethazine (PHENERGAN) 25 MG tablet Take 1 tablet (25 mg total) by mouth every 6 (six) hours as needed for nausea.  . Suvorexant (BELSOMRA) 10 MG TABS Take 10 mg by mouth daily as needed (for sleep).  . [DISCONTINUED] Multiple Vitamin (MULTIVITAMIN  WITH MINERALS) TABS tablet Take 1 tablet by mouth daily.  . [DISCONTINUED] oxyCODONE-acetaminophen (PERCOCET) 5-325 MG per tablet Take 1-2 tablets by mouth every 4 (four) hours as needed.  :  Review of Systems:  Out of a complete 14 point review of systems, all are reviewed and negative with the exception of these symptoms as listed below:   Review of Systems  Constitutional: Positive for fatigue.  Gastrointestinal: Positive for nausea, abdominal pain, diarrhea and constipation.  Neurological:       Insomnia,memory loss, headache, weakness    Objective:  Neurologic Exam  Physical Exam Physical Examination:   Filed Vitals:   06/28/14 1451  BP: 108/73  Pulse: 72  Temp: 98.6 F (37 C)    General Examination: The patient is a very pleasant 48 y.o. female in no acute distress. She appears well-developed and well-nourished and well groomed.   HEENT: Normocephalic, atraumatic, pupils are equal, round and reactive to light and accommodation. Funduscopic exam is normal with sharp disc margins noted. Extraocular tracking is good without limitation to gaze excursion or nystagmus noted. Normal smooth pursuit is noted. Hearing is grossly intact. Face  is symmetric with normal facial animation and normal facial sensation. Speech is clear with no dysarthria noted. There is no hypophonia. There is no lip, neck/head, jaw or voice tremor. Neck is supple with full range of passive and active motion. There are no carotid bruits on auscultation. Oropharynx exam reveals: mild mouth dryness, adequate dental hygiene and no significant airway crowding. Mallampati is class II. Tongue protrudes centrally and palate elevates symmetrically.  Chest: Clear to auscultation without wheezing, rhonchi or crackles noted.  Heart: S1+S2+0, regular and normal without murmurs, rubs or gallops noted.   Abdomen: Soft, non-tender and non-distended with normal bowel sounds appreciated on auscultation.  Extremities: There is no pitting edema in the distal lower extremities bilaterally. Pedal pulses are intact.  Skin: Warm and dry without trophic changes noted. There are no varicose veins.  Musculoskeletal: exam reveals no obvious joint deformities, tenderness or joint swelling or erythema.   Neurologically:  Mental status: The patient is awake, alert and oriented in all 4 spheres. Her immediate and remote memory, attention, language skills and fund of knowledge are appropriate, again there is very mild psychomotor retardation. There is no evidence of aphasia, agnosia, apraxia or anomia. Speech is slightly slow and mildly hypophonic. Thought process is linear. Mood is decreased range and affect is somewhat blunted.  Cranial nerves II - XII are as described above under HEENT exam. In addition: shoulder shrug is normal with equal shoulder height noted. Motor exam: Normal bulk, and tone is noted. Strength is globally 5-/5. There is no drift, tremor or rebound. Romberg is negative. There is no focal atrophy, no fasciculation, no myoclonus. Reflexes are 1+ throughout. Babinski: Toes are flexor bilaterally. Fine motor skills and coordination: intact with normal finger taps, normal  hand movements, normal rapid alternating patting, normal foot taps and normal foot agility. However, she is noted to have some slowness in her movements globally. Cerebellar testing: No dysmetria or intention tremor on finger to nose testing. Heel to shin is unremarkable bilaterally. There is no truncal or gait ataxia.  Sensory exam: intact to light touch, pinprick, vibration, temperature sense in the upper and lower extremities.  Gait, station and balance: She stands easily. No veering to one side is noted. No leaning to one side is noted. Posture is age-appropriate and stance is narrow based. Gait shows normal stride length and normal  pace. No problems turning are noted. She turns en bloc. Tandem walk is unremarkable. Intact toe and heel stance is noted.               Assessment and Plan:   In summary, RASHAE ROTHER is a very pleasant 48 year old female with a complex medical history of recurrent pancreatitis, DVT, pneumonia in July 2012, ADD, history of migraines with aura, allergic rhinitis, anxiety, status post cholecystectomy, appendectomy, ERCP with sphincterectomy and stent placement in July 2013, reflux disease, who reports intermittent muscle weakness and fatigue and forgetfulness for the last 3+years. She presents with a vague symptomatology. On physical exam she has mild slowness in her thinking and overall mild psychomotor retardation, which appears unchanged and she is very slightly globally weak but has no focality on exam and recent workup in the form of EEG, brain MRI with and without contrast and blood work showed no specific abnormalities other than mild abnormalities in herrheumatological markers for which we consulted with rheumatology. She was advised that there was no specific rheumatological illness and did not need further workup from the rheumatological standpoint or follow-up. I'm still not sure how to tie and her symptoms altogether. I do not see that we are missing a primary  neurological etiology of her symptoms. Her EEG was normal, brain MRI was nonrevealing, and her vitamin D level was borderline low and CK level was very mildly elevated, nonspecific in and of itself. She is advised that she can be monitored in her symptoms by her primary care physician. I would be happy to see her back if the need arises down the Road but at this time I am not sure that I cannot anything else.  We went over her test results today. I answered all her questions today and she was in agreement with the plan. in detail today.

## 2014-06-28 NOTE — Patient Instructions (Signed)
Your exam is stable. We have not found a primary neurological reason for your symptoms. I think, we can watch your symptoms and you can follow up with Dr. Shelia Media.

## 2015-02-23 ENCOUNTER — Encounter: Payer: Self-pay | Admitting: Gastroenterology

## 2016-03-06 DIAGNOSIS — K861 Other chronic pancreatitis: Secondary | ICD-10-CM | POA: Diagnosis not present

## 2016-03-29 DIAGNOSIS — G894 Chronic pain syndrome: Secondary | ICD-10-CM | POA: Diagnosis not present

## 2016-03-29 DIAGNOSIS — Z79891 Long term (current) use of opiate analgesic: Secondary | ICD-10-CM | POA: Diagnosis not present

## 2016-03-29 DIAGNOSIS — Z79899 Other long term (current) drug therapy: Secondary | ICD-10-CM | POA: Diagnosis not present

## 2016-03-29 DIAGNOSIS — M792 Neuralgia and neuritis, unspecified: Secondary | ICD-10-CM | POA: Diagnosis not present

## 2016-05-21 DIAGNOSIS — G894 Chronic pain syndrome: Secondary | ICD-10-CM | POA: Diagnosis not present

## 2016-05-21 DIAGNOSIS — R109 Unspecified abdominal pain: Secondary | ICD-10-CM | POA: Diagnosis not present

## 2016-05-21 DIAGNOSIS — M792 Neuralgia and neuritis, unspecified: Secondary | ICD-10-CM | POA: Diagnosis not present

## 2016-05-21 DIAGNOSIS — Z79899 Other long term (current) drug therapy: Secondary | ICD-10-CM | POA: Diagnosis not present

## 2016-05-21 DIAGNOSIS — M545 Low back pain: Secondary | ICD-10-CM | POA: Diagnosis not present

## 2016-05-21 DIAGNOSIS — Z79891 Long term (current) use of opiate analgesic: Secondary | ICD-10-CM | POA: Diagnosis not present

## 2016-06-03 DIAGNOSIS — M94261 Chondromalacia, right knee: Secondary | ICD-10-CM | POA: Diagnosis not present

## 2016-06-03 DIAGNOSIS — M25462 Effusion, left knee: Secondary | ICD-10-CM | POA: Diagnosis not present

## 2016-06-13 DIAGNOSIS — K861 Other chronic pancreatitis: Secondary | ICD-10-CM | POA: Diagnosis not present

## 2016-06-19 DIAGNOSIS — G8929 Other chronic pain: Secondary | ICD-10-CM | POA: Diagnosis not present

## 2016-06-19 DIAGNOSIS — Z5181 Encounter for therapeutic drug level monitoring: Secondary | ICD-10-CM | POA: Diagnosis not present

## 2016-06-19 DIAGNOSIS — Z87891 Personal history of nicotine dependence: Secondary | ICD-10-CM | POA: Diagnosis not present

## 2016-06-19 DIAGNOSIS — Z79891 Long term (current) use of opiate analgesic: Secondary | ICD-10-CM | POA: Diagnosis not present

## 2016-06-19 DIAGNOSIS — Z79899 Other long term (current) drug therapy: Secondary | ICD-10-CM | POA: Diagnosis not present

## 2016-06-19 DIAGNOSIS — K861 Other chronic pancreatitis: Secondary | ICD-10-CM | POA: Diagnosis not present

## 2016-06-19 DIAGNOSIS — R109 Unspecified abdominal pain: Secondary | ICD-10-CM | POA: Diagnosis not present

## 2016-06-24 DIAGNOSIS — Z79891 Long term (current) use of opiate analgesic: Secondary | ICD-10-CM | POA: Diagnosis not present

## 2016-06-24 DIAGNOSIS — Z87891 Personal history of nicotine dependence: Secondary | ICD-10-CM | POA: Diagnosis not present

## 2016-06-24 DIAGNOSIS — K861 Other chronic pancreatitis: Secondary | ICD-10-CM | POA: Diagnosis not present

## 2016-06-24 DIAGNOSIS — F329 Major depressive disorder, single episode, unspecified: Secondary | ICD-10-CM | POA: Diagnosis not present

## 2016-06-24 DIAGNOSIS — G8929 Other chronic pain: Secondary | ICD-10-CM | POA: Diagnosis not present

## 2016-06-24 DIAGNOSIS — R109 Unspecified abdominal pain: Secondary | ICD-10-CM | POA: Diagnosis not present

## 2016-06-24 DIAGNOSIS — M792 Neuralgia and neuritis, unspecified: Secondary | ICD-10-CM | POA: Diagnosis not present

## 2016-06-24 DIAGNOSIS — F419 Anxiety disorder, unspecified: Secondary | ICD-10-CM | POA: Diagnosis not present

## 2016-07-12 DIAGNOSIS — M2242 Chondromalacia patellae, left knee: Secondary | ICD-10-CM | POA: Diagnosis not present

## 2016-07-22 DIAGNOSIS — Z79891 Long term (current) use of opiate analgesic: Secondary | ICD-10-CM | POA: Diagnosis not present

## 2016-07-22 DIAGNOSIS — F419 Anxiety disorder, unspecified: Secondary | ICD-10-CM | POA: Diagnosis not present

## 2016-07-22 DIAGNOSIS — Z888 Allergy status to other drugs, medicaments and biological substances status: Secondary | ICD-10-CM | POA: Diagnosis not present

## 2016-07-22 DIAGNOSIS — Z79899 Other long term (current) drug therapy: Secondary | ICD-10-CM | POA: Diagnosis not present

## 2016-07-22 DIAGNOSIS — R109 Unspecified abdominal pain: Secondary | ICD-10-CM | POA: Diagnosis not present

## 2016-07-22 DIAGNOSIS — Z87891 Personal history of nicotine dependence: Secondary | ICD-10-CM | POA: Diagnosis not present

## 2016-07-22 DIAGNOSIS — G8929 Other chronic pain: Secondary | ICD-10-CM | POA: Diagnosis not present

## 2016-07-22 DIAGNOSIS — R51 Headache: Secondary | ICD-10-CM | POA: Diagnosis not present

## 2016-07-22 DIAGNOSIS — M792 Neuralgia and neuritis, unspecified: Secondary | ICD-10-CM | POA: Diagnosis not present

## 2016-07-22 DIAGNOSIS — F329 Major depressive disorder, single episode, unspecified: Secondary | ICD-10-CM | POA: Diagnosis not present

## 2016-07-22 DIAGNOSIS — G588 Other specified mononeuropathies: Secondary | ICD-10-CM | POA: Diagnosis not present

## 2016-07-22 DIAGNOSIS — Z885 Allergy status to narcotic agent status: Secondary | ICD-10-CM | POA: Diagnosis not present

## 2016-07-25 DIAGNOSIS — K219 Gastro-esophageal reflux disease without esophagitis: Secondary | ICD-10-CM | POA: Diagnosis not present

## 2016-07-25 DIAGNOSIS — K861 Other chronic pancreatitis: Secondary | ICD-10-CM | POA: Diagnosis not present

## 2016-09-19 DIAGNOSIS — Z79891 Long term (current) use of opiate analgesic: Secondary | ICD-10-CM | POA: Diagnosis not present

## 2016-09-19 DIAGNOSIS — F419 Anxiety disorder, unspecified: Secondary | ICD-10-CM | POA: Diagnosis not present

## 2016-09-19 DIAGNOSIS — M792 Neuralgia and neuritis, unspecified: Secondary | ICD-10-CM | POA: Diagnosis not present

## 2016-09-19 DIAGNOSIS — K861 Other chronic pancreatitis: Secondary | ICD-10-CM | POA: Diagnosis not present

## 2016-09-19 DIAGNOSIS — G629 Polyneuropathy, unspecified: Secondary | ICD-10-CM | POA: Diagnosis not present

## 2016-09-19 DIAGNOSIS — G8929 Other chronic pain: Secondary | ICD-10-CM | POA: Diagnosis not present

## 2016-09-19 DIAGNOSIS — R109 Unspecified abdominal pain: Secondary | ICD-10-CM | POA: Diagnosis not present

## 2016-09-19 DIAGNOSIS — Z87891 Personal history of nicotine dependence: Secondary | ICD-10-CM | POA: Diagnosis not present

## 2016-11-18 DIAGNOSIS — G588 Other specified mononeuropathies: Secondary | ICD-10-CM | POA: Diagnosis not present

## 2016-11-18 DIAGNOSIS — G8929 Other chronic pain: Secondary | ICD-10-CM | POA: Diagnosis not present

## 2016-11-18 DIAGNOSIS — R109 Unspecified abdominal pain: Secondary | ICD-10-CM | POA: Diagnosis not present

## 2016-11-18 DIAGNOSIS — M792 Neuralgia and neuritis, unspecified: Secondary | ICD-10-CM | POA: Diagnosis not present

## 2016-11-18 DIAGNOSIS — F419 Anxiety disorder, unspecified: Secondary | ICD-10-CM | POA: Diagnosis not present

## 2016-11-18 DIAGNOSIS — Z8719 Personal history of other diseases of the digestive system: Secondary | ICD-10-CM | POA: Diagnosis not present

## 2016-11-18 DIAGNOSIS — Z87891 Personal history of nicotine dependence: Secondary | ICD-10-CM | POA: Diagnosis not present

## 2016-11-18 DIAGNOSIS — M791 Myalgia: Secondary | ICD-10-CM | POA: Diagnosis not present

## 2017-01-07 DIAGNOSIS — R5383 Other fatigue: Secondary | ICD-10-CM | POA: Diagnosis not present

## 2017-01-07 DIAGNOSIS — K8689 Other specified diseases of pancreas: Secondary | ICD-10-CM | POA: Diagnosis not present

## 2017-01-07 DIAGNOSIS — R600 Localized edema: Secondary | ICD-10-CM | POA: Diagnosis not present

## 2017-01-07 DIAGNOSIS — E559 Vitamin D deficiency, unspecified: Secondary | ICD-10-CM | POA: Diagnosis not present

## 2017-01-10 DIAGNOSIS — R5383 Other fatigue: Secondary | ICD-10-CM | POA: Diagnosis not present

## 2017-01-10 DIAGNOSIS — R2689 Other abnormalities of gait and mobility: Secondary | ICD-10-CM | POA: Diagnosis not present

## 2017-01-10 DIAGNOSIS — M791 Myalgia: Secondary | ICD-10-CM | POA: Diagnosis not present

## 2017-01-10 DIAGNOSIS — Z86010 Personal history of colonic polyps: Secondary | ICD-10-CM | POA: Diagnosis not present

## 2017-01-10 DIAGNOSIS — Z86718 Personal history of other venous thrombosis and embolism: Secondary | ICD-10-CM | POA: Diagnosis not present

## 2017-01-10 DIAGNOSIS — K8689 Other specified diseases of pancreas: Secondary | ICD-10-CM | POA: Diagnosis not present

## 2017-01-10 DIAGNOSIS — J309 Allergic rhinitis, unspecified: Secondary | ICD-10-CM | POA: Diagnosis not present

## 2017-02-09 DIAGNOSIS — Z888 Allergy status to other drugs, medicaments and biological substances status: Secondary | ICD-10-CM | POA: Diagnosis not present

## 2017-02-09 DIAGNOSIS — R05 Cough: Secondary | ICD-10-CM | POA: Diagnosis not present

## 2017-02-09 DIAGNOSIS — Z9049 Acquired absence of other specified parts of digestive tract: Secondary | ICD-10-CM | POA: Diagnosis not present

## 2017-02-09 DIAGNOSIS — K859 Acute pancreatitis without necrosis or infection, unspecified: Secondary | ICD-10-CM | POA: Diagnosis not present

## 2017-02-09 DIAGNOSIS — R0602 Shortness of breath: Secondary | ICD-10-CM | POA: Diagnosis not present

## 2017-02-09 DIAGNOSIS — J9811 Atelectasis: Secondary | ICD-10-CM | POA: Diagnosis not present

## 2017-02-09 DIAGNOSIS — Z87891 Personal history of nicotine dependence: Secondary | ICD-10-CM | POA: Diagnosis not present

## 2017-02-09 DIAGNOSIS — J069 Acute upper respiratory infection, unspecified: Secondary | ICD-10-CM | POA: Diagnosis not present

## 2017-02-09 DIAGNOSIS — Z79899 Other long term (current) drug therapy: Secondary | ICD-10-CM | POA: Diagnosis not present

## 2017-02-09 DIAGNOSIS — R509 Fever, unspecified: Secondary | ICD-10-CM | POA: Diagnosis not present

## 2017-02-10 DIAGNOSIS — G8929 Other chronic pain: Secondary | ICD-10-CM | POA: Diagnosis not present

## 2017-02-10 DIAGNOSIS — Z79899 Other long term (current) drug therapy: Secondary | ICD-10-CM | POA: Diagnosis not present

## 2017-02-10 DIAGNOSIS — R1084 Generalized abdominal pain: Secondary | ICD-10-CM | POA: Diagnosis not present

## 2017-02-10 DIAGNOSIS — Z79891 Long term (current) use of opiate analgesic: Secondary | ICD-10-CM | POA: Diagnosis not present

## 2017-02-10 DIAGNOSIS — M792 Neuralgia and neuritis, unspecified: Secondary | ICD-10-CM | POA: Diagnosis not present

## 2017-02-10 DIAGNOSIS — Z87891 Personal history of nicotine dependence: Secondary | ICD-10-CM | POA: Diagnosis not present

## 2017-02-10 DIAGNOSIS — G629 Polyneuropathy, unspecified: Secondary | ICD-10-CM | POA: Diagnosis not present

## 2017-02-10 DIAGNOSIS — R109 Unspecified abdominal pain: Secondary | ICD-10-CM | POA: Diagnosis not present

## 2017-02-10 DIAGNOSIS — M791 Myalgia: Secondary | ICD-10-CM | POA: Diagnosis not present

## 2017-02-19 DIAGNOSIS — K861 Other chronic pancreatitis: Secondary | ICD-10-CM | POA: Diagnosis not present

## 2017-02-19 DIAGNOSIS — Z79899 Other long term (current) drug therapy: Secondary | ICD-10-CM | POA: Diagnosis not present

## 2017-02-19 DIAGNOSIS — Z79891 Long term (current) use of opiate analgesic: Secondary | ICD-10-CM | POA: Diagnosis not present

## 2017-02-19 DIAGNOSIS — F419 Anxiety disorder, unspecified: Secondary | ICD-10-CM | POA: Diagnosis not present

## 2017-02-19 DIAGNOSIS — E669 Obesity, unspecified: Secondary | ICD-10-CM | POA: Diagnosis not present

## 2017-02-19 DIAGNOSIS — R109 Unspecified abdominal pain: Secondary | ICD-10-CM | POA: Diagnosis not present

## 2017-02-19 DIAGNOSIS — Z885 Allergy status to narcotic agent status: Secondary | ICD-10-CM | POA: Diagnosis not present

## 2017-02-19 DIAGNOSIS — Z87891 Personal history of nicotine dependence: Secondary | ICD-10-CM | POA: Diagnosis not present

## 2017-02-19 DIAGNOSIS — F329 Major depressive disorder, single episode, unspecified: Secondary | ICD-10-CM | POA: Diagnosis not present

## 2017-02-19 DIAGNOSIS — G8929 Other chronic pain: Secondary | ICD-10-CM | POA: Diagnosis not present

## 2017-02-19 DIAGNOSIS — M792 Neuralgia and neuritis, unspecified: Secondary | ICD-10-CM | POA: Diagnosis not present

## 2017-02-24 DIAGNOSIS — N951 Menopausal and female climacteric states: Secondary | ICD-10-CM | POA: Diagnosis not present

## 2017-02-24 DIAGNOSIS — Z01419 Encounter for gynecological examination (general) (routine) without abnormal findings: Secondary | ICD-10-CM | POA: Diagnosis not present

## 2017-02-24 DIAGNOSIS — Z124 Encounter for screening for malignant neoplasm of cervix: Secondary | ICD-10-CM | POA: Diagnosis not present

## 2017-02-24 DIAGNOSIS — Z1231 Encounter for screening mammogram for malignant neoplasm of breast: Secondary | ICD-10-CM | POA: Diagnosis not present

## 2017-02-24 DIAGNOSIS — Z6833 Body mass index (BMI) 33.0-33.9, adult: Secondary | ICD-10-CM | POA: Diagnosis not present

## 2017-02-26 ENCOUNTER — Other Ambulatory Visit: Payer: Self-pay | Admitting: Obstetrics and Gynecology

## 2017-02-26 DIAGNOSIS — R928 Other abnormal and inconclusive findings on diagnostic imaging of breast: Secondary | ICD-10-CM

## 2017-03-05 ENCOUNTER — Ambulatory Visit: Payer: Self-pay

## 2017-03-05 ENCOUNTER — Ambulatory Visit
Admission: RE | Admit: 2017-03-05 | Discharge: 2017-03-05 | Disposition: A | Discharge status: Home / Self Care | Payer: Self-pay | Source: Ambulatory Visit | Attending: Obstetrics and Gynecology | Admitting: Obstetrics and Gynecology

## 2017-03-05 DIAGNOSIS — R928 Other abnormal and inconclusive findings on diagnostic imaging of breast: Secondary | ICD-10-CM

## 2017-05-15 DIAGNOSIS — F329 Major depressive disorder, single episode, unspecified: Secondary | ICD-10-CM | POA: Diagnosis not present

## 2017-05-15 DIAGNOSIS — Z87891 Personal history of nicotine dependence: Secondary | ICD-10-CM | POA: Diagnosis not present

## 2017-05-15 DIAGNOSIS — Z79891 Long term (current) use of opiate analgesic: Secondary | ICD-10-CM | POA: Diagnosis not present

## 2017-05-15 DIAGNOSIS — M791 Myalgia, unspecified site: Secondary | ICD-10-CM | POA: Diagnosis not present

## 2017-05-15 DIAGNOSIS — Z6831 Body mass index (BMI) 31.0-31.9, adult: Secondary | ICD-10-CM | POA: Diagnosis not present

## 2017-05-15 DIAGNOSIS — E669 Obesity, unspecified: Secondary | ICD-10-CM | POA: Diagnosis not present

## 2017-05-15 DIAGNOSIS — F419 Anxiety disorder, unspecified: Secondary | ICD-10-CM | POA: Diagnosis not present

## 2017-05-15 DIAGNOSIS — M792 Neuralgia and neuritis, unspecified: Secondary | ICD-10-CM | POA: Diagnosis not present

## 2017-05-15 DIAGNOSIS — K861 Other chronic pancreatitis: Secondary | ICD-10-CM | POA: Diagnosis not present

## 2017-05-20 DIAGNOSIS — R110 Nausea: Secondary | ICD-10-CM | POA: Diagnosis not present

## 2017-05-20 DIAGNOSIS — R1012 Left upper quadrant pain: Secondary | ICD-10-CM | POA: Diagnosis not present

## 2017-06-10 DIAGNOSIS — H43311 Vitreous membranes and strands, right eye: Secondary | ICD-10-CM | POA: Diagnosis not present

## 2017-06-11 DIAGNOSIS — N950 Postmenopausal bleeding: Secondary | ICD-10-CM | POA: Diagnosis not present

## 2017-07-17 DIAGNOSIS — K861 Other chronic pancreatitis: Secondary | ICD-10-CM | POA: Diagnosis not present

## 2017-08-13 DIAGNOSIS — Z5181 Encounter for therapeutic drug level monitoring: Secondary | ICD-10-CM | POA: Diagnosis not present

## 2017-08-13 DIAGNOSIS — K861 Other chronic pancreatitis: Secondary | ICD-10-CM | POA: Diagnosis not present

## 2017-08-13 DIAGNOSIS — Z79899 Other long term (current) drug therapy: Secondary | ICD-10-CM | POA: Diagnosis not present

## 2017-08-13 DIAGNOSIS — Z87891 Personal history of nicotine dependence: Secondary | ICD-10-CM | POA: Diagnosis not present

## 2017-08-13 DIAGNOSIS — G8929 Other chronic pain: Secondary | ICD-10-CM | POA: Diagnosis not present

## 2017-08-13 DIAGNOSIS — Z79891 Long term (current) use of opiate analgesic: Secondary | ICD-10-CM | POA: Diagnosis not present

## 2017-08-13 DIAGNOSIS — M7918 Myalgia, other site: Secondary | ICD-10-CM | POA: Diagnosis not present

## 2017-08-27 DIAGNOSIS — K8689 Other specified diseases of pancreas: Secondary | ICD-10-CM | POA: Diagnosis not present

## 2017-08-27 DIAGNOSIS — R195 Other fecal abnormalities: Secondary | ICD-10-CM | POA: Diagnosis not present

## 2017-08-27 DIAGNOSIS — R197 Diarrhea, unspecified: Secondary | ICD-10-CM | POA: Diagnosis not present

## 2017-08-28 DIAGNOSIS — R197 Diarrhea, unspecified: Secondary | ICD-10-CM | POA: Diagnosis not present

## 2017-08-29 DIAGNOSIS — R1013 Epigastric pain: Secondary | ICD-10-CM | POA: Diagnosis not present

## 2017-08-29 DIAGNOSIS — K8689 Other specified diseases of pancreas: Secondary | ICD-10-CM | POA: Diagnosis not present

## 2017-08-29 DIAGNOSIS — R935 Abnormal findings on diagnostic imaging of other abdominal regions, including retroperitoneum: Secondary | ICD-10-CM | POA: Diagnosis not present

## 2017-08-29 DIAGNOSIS — Z9049 Acquired absence of other specified parts of digestive tract: Secondary | ICD-10-CM | POA: Diagnosis not present

## 2017-09-09 DIAGNOSIS — N390 Urinary tract infection, site not specified: Secondary | ICD-10-CM | POA: Diagnosis not present

## 2017-09-09 DIAGNOSIS — R300 Dysuria: Secondary | ICD-10-CM | POA: Diagnosis not present

## 2017-09-24 DIAGNOSIS — Z79891 Long term (current) use of opiate analgesic: Secondary | ICD-10-CM | POA: Diagnosis not present

## 2017-09-24 DIAGNOSIS — M792 Neuralgia and neuritis, unspecified: Secondary | ICD-10-CM | POA: Diagnosis not present

## 2017-09-24 DIAGNOSIS — Z79899 Other long term (current) drug therapy: Secondary | ICD-10-CM | POA: Diagnosis not present

## 2017-09-24 DIAGNOSIS — Z87891 Personal history of nicotine dependence: Secondary | ICD-10-CM | POA: Diagnosis not present

## 2017-09-24 DIAGNOSIS — R109 Unspecified abdominal pain: Secondary | ICD-10-CM | POA: Diagnosis not present

## 2017-09-24 DIAGNOSIS — M7918 Myalgia, other site: Secondary | ICD-10-CM | POA: Diagnosis not present

## 2017-09-24 DIAGNOSIS — K861 Other chronic pancreatitis: Secondary | ICD-10-CM | POA: Diagnosis not present

## 2017-12-07 DIAGNOSIS — R300 Dysuria: Secondary | ICD-10-CM | POA: Diagnosis not present

## 2017-12-07 DIAGNOSIS — Z87440 Personal history of urinary (tract) infections: Secondary | ICD-10-CM | POA: Diagnosis not present

## 2017-12-24 DIAGNOSIS — N950 Postmenopausal bleeding: Secondary | ICD-10-CM | POA: Diagnosis not present

## 2017-12-24 DIAGNOSIS — R9389 Abnormal findings on diagnostic imaging of other specified body structures: Secondary | ICD-10-CM | POA: Diagnosis not present

## 2017-12-24 DIAGNOSIS — Z780 Asymptomatic menopausal state: Secondary | ICD-10-CM | POA: Diagnosis not present

## 2018-01-08 DIAGNOSIS — E559 Vitamin D deficiency, unspecified: Secondary | ICD-10-CM | POA: Diagnosis not present

## 2018-01-08 DIAGNOSIS — K8689 Other specified diseases of pancreas: Secondary | ICD-10-CM | POA: Diagnosis not present

## 2018-01-08 DIAGNOSIS — R5383 Other fatigue: Secondary | ICD-10-CM | POA: Diagnosis not present

## 2018-01-08 DIAGNOSIS — E039 Hypothyroidism, unspecified: Secondary | ICD-10-CM | POA: Diagnosis not present

## 2018-01-08 DIAGNOSIS — R600 Localized edema: Secondary | ICD-10-CM | POA: Diagnosis not present

## 2018-01-20 DIAGNOSIS — K861 Other chronic pancreatitis: Secondary | ICD-10-CM | POA: Diagnosis not present

## 2018-01-20 DIAGNOSIS — F419 Anxiety disorder, unspecified: Secondary | ICD-10-CM | POA: Diagnosis not present

## 2018-01-20 DIAGNOSIS — Z0000 Encounter for general adult medical examination without abnormal findings: Secondary | ICD-10-CM | POA: Diagnosis not present

## 2018-01-20 DIAGNOSIS — F3341 Major depressive disorder, recurrent, in partial remission: Secondary | ICD-10-CM | POA: Diagnosis not present

## 2018-03-09 DIAGNOSIS — Z6827 Body mass index (BMI) 27.0-27.9, adult: Secondary | ICD-10-CM | POA: Diagnosis not present

## 2018-03-09 DIAGNOSIS — Z01419 Encounter for gynecological examination (general) (routine) without abnormal findings: Secondary | ICD-10-CM | POA: Diagnosis not present

## 2018-03-09 DIAGNOSIS — Z1231 Encounter for screening mammogram for malignant neoplasm of breast: Secondary | ICD-10-CM | POA: Diagnosis not present

## 2018-06-11 DIAGNOSIS — M792 Neuralgia and neuritis, unspecified: Secondary | ICD-10-CM | POA: Diagnosis not present

## 2018-06-11 DIAGNOSIS — M7918 Myalgia, other site: Secondary | ICD-10-CM | POA: Diagnosis not present

## 2018-06-11 DIAGNOSIS — Z8719 Personal history of other diseases of the digestive system: Secondary | ICD-10-CM | POA: Diagnosis not present

## 2018-07-16 DIAGNOSIS — K861 Other chronic pancreatitis: Secondary | ICD-10-CM | POA: Diagnosis not present

## 2018-08-10 DIAGNOSIS — Z79899 Other long term (current) drug therapy: Secondary | ICD-10-CM | POA: Diagnosis not present

## 2018-08-10 DIAGNOSIS — Z87891 Personal history of nicotine dependence: Secondary | ICD-10-CM | POA: Diagnosis not present

## 2018-08-10 DIAGNOSIS — G8929 Other chronic pain: Secondary | ICD-10-CM | POA: Diagnosis not present

## 2018-08-10 DIAGNOSIS — R109 Unspecified abdominal pain: Secondary | ICD-10-CM | POA: Diagnosis not present

## 2018-08-10 DIAGNOSIS — M792 Neuralgia and neuritis, unspecified: Secondary | ICD-10-CM | POA: Diagnosis not present

## 2018-08-10 DIAGNOSIS — G629 Polyneuropathy, unspecified: Secondary | ICD-10-CM | POA: Diagnosis not present

## 2018-08-10 DIAGNOSIS — M7918 Myalgia, other site: Secondary | ICD-10-CM | POA: Diagnosis not present

## 2018-09-01 ENCOUNTER — Other Ambulatory Visit: Payer: Self-pay | Admitting: Obstetrics and Gynecology

## 2018-09-01 DIAGNOSIS — N950 Postmenopausal bleeding: Secondary | ICD-10-CM | POA: Diagnosis not present

## 2018-09-01 DIAGNOSIS — N6320 Unspecified lump in the left breast, unspecified quadrant: Secondary | ICD-10-CM | POA: Diagnosis not present

## 2018-09-01 DIAGNOSIS — N858 Other specified noninflammatory disorders of uterus: Secondary | ICD-10-CM | POA: Diagnosis not present

## 2018-09-04 ENCOUNTER — Ambulatory Visit
Admission: RE | Admit: 2018-09-04 | Discharge: 2018-09-04 | Disposition: A | Discharge status: Home / Self Care | Payer: Medicare Other | Source: Ambulatory Visit | Attending: Obstetrics and Gynecology | Admitting: Obstetrics and Gynecology

## 2018-09-04 DIAGNOSIS — N6320 Unspecified lump in the left breast, unspecified quadrant: Secondary | ICD-10-CM

## 2018-09-04 DIAGNOSIS — R928 Other abnormal and inconclusive findings on diagnostic imaging of breast: Secondary | ICD-10-CM | POA: Diagnosis not present

## 2018-09-15 DIAGNOSIS — N6019 Diffuse cystic mastopathy of unspecified breast: Secondary | ICD-10-CM | POA: Diagnosis not present

## 2018-10-14 DIAGNOSIS — E860 Dehydration: Secondary | ICD-10-CM | POA: Diagnosis not present

## 2018-10-14 DIAGNOSIS — R197 Diarrhea, unspecified: Secondary | ICD-10-CM | POA: Diagnosis not present

## 2018-10-14 DIAGNOSIS — R110 Nausea: Secondary | ICD-10-CM | POA: Diagnosis not present

## 2018-10-14 DIAGNOSIS — K861 Other chronic pancreatitis: Secondary | ICD-10-CM | POA: Diagnosis not present

## 2018-12-15 DIAGNOSIS — W57XXXA Bitten or stung by nonvenomous insect and other nonvenomous arthropods, initial encounter: Secondary | ICD-10-CM | POA: Diagnosis not present

## 2018-12-15 DIAGNOSIS — K861 Other chronic pancreatitis: Secondary | ICD-10-CM | POA: Diagnosis not present

## 2019-01-14 DIAGNOSIS — R350 Frequency of micturition: Secondary | ICD-10-CM | POA: Diagnosis not present

## 2019-01-14 DIAGNOSIS — N760 Acute vaginitis: Secondary | ICD-10-CM | POA: Diagnosis not present

## 2019-02-16 DIAGNOSIS — E559 Vitamin D deficiency, unspecified: Secondary | ICD-10-CM | POA: Diagnosis not present

## 2019-02-16 DIAGNOSIS — E039 Hypothyroidism, unspecified: Secondary | ICD-10-CM | POA: Diagnosis not present

## 2019-02-16 DIAGNOSIS — R5383 Other fatigue: Secondary | ICD-10-CM | POA: Diagnosis not present

## 2019-02-23 DIAGNOSIS — K858 Other acute pancreatitis without necrosis or infection: Secondary | ICD-10-CM | POA: Diagnosis not present

## 2019-02-23 DIAGNOSIS — Z0000 Encounter for general adult medical examination without abnormal findings: Secondary | ICD-10-CM | POA: Diagnosis not present

## 2019-02-23 DIAGNOSIS — F339 Major depressive disorder, recurrent, unspecified: Secondary | ICD-10-CM | POA: Diagnosis not present

## 2019-04-26 DIAGNOSIS — M545 Low back pain: Secondary | ICD-10-CM | POA: Diagnosis not present

## 2019-04-29 DIAGNOSIS — M533 Sacrococcygeal disorders, not elsewhere classified: Secondary | ICD-10-CM | POA: Diagnosis not present

## 2019-04-29 DIAGNOSIS — M791 Myalgia, unspecified site: Secondary | ICD-10-CM | POA: Diagnosis not present

## 2019-04-29 DIAGNOSIS — Z79899 Other long term (current) drug therapy: Secondary | ICD-10-CM | POA: Diagnosis not present

## 2019-04-29 DIAGNOSIS — R1084 Generalized abdominal pain: Secondary | ICD-10-CM | POA: Diagnosis not present

## 2019-04-29 DIAGNOSIS — G629 Polyneuropathy, unspecified: Secondary | ICD-10-CM | POA: Diagnosis not present

## 2019-04-29 DIAGNOSIS — Z87891 Personal history of nicotine dependence: Secondary | ICD-10-CM | POA: Diagnosis not present

## 2019-04-29 DIAGNOSIS — M545 Low back pain: Secondary | ICD-10-CM | POA: Diagnosis not present

## 2019-04-29 DIAGNOSIS — M5416 Radiculopathy, lumbar region: Secondary | ICD-10-CM | POA: Diagnosis not present

## 2019-04-29 DIAGNOSIS — M461 Sacroiliitis, not elsewhere classified: Secondary | ICD-10-CM | POA: Diagnosis not present

## 2019-04-29 DIAGNOSIS — G8929 Other chronic pain: Secondary | ICD-10-CM | POA: Diagnosis not present

## 2019-05-06 DIAGNOSIS — M545 Low back pain: Secondary | ICD-10-CM | POA: Diagnosis not present

## 2019-05-14 DIAGNOSIS — M4696 Unspecified inflammatory spondylopathy, lumbar region: Secondary | ICD-10-CM | POA: Diagnosis not present

## 2019-05-14 DIAGNOSIS — M533 Sacrococcygeal disorders, not elsewhere classified: Secondary | ICD-10-CM | POA: Diagnosis not present

## 2019-05-14 DIAGNOSIS — M545 Low back pain: Secondary | ICD-10-CM | POA: Diagnosis not present

## 2019-06-07 DIAGNOSIS — M5136 Other intervertebral disc degeneration, lumbar region: Secondary | ICD-10-CM | POA: Diagnosis not present

## 2019-06-07 DIAGNOSIS — Z79899 Other long term (current) drug therapy: Secondary | ICD-10-CM | POA: Diagnosis not present

## 2019-06-07 DIAGNOSIS — M461 Sacroiliitis, not elsewhere classified: Secondary | ICD-10-CM | POA: Diagnosis not present

## 2019-06-07 DIAGNOSIS — G588 Other specified mononeuropathies: Secondary | ICD-10-CM | POA: Diagnosis not present

## 2019-06-07 DIAGNOSIS — Z87891 Personal history of nicotine dependence: Secondary | ICD-10-CM | POA: Diagnosis not present

## 2019-06-07 DIAGNOSIS — G629 Polyneuropathy, unspecified: Secondary | ICD-10-CM | POA: Diagnosis not present

## 2019-06-07 DIAGNOSIS — M533 Sacrococcygeal disorders, not elsewhere classified: Secondary | ICD-10-CM | POA: Diagnosis not present

## 2019-06-07 DIAGNOSIS — M791 Myalgia, unspecified site: Secondary | ICD-10-CM | POA: Diagnosis not present

## 2019-06-07 DIAGNOSIS — G8929 Other chronic pain: Secondary | ICD-10-CM | POA: Diagnosis not present

## 2019-07-22 ENCOUNTER — Other Ambulatory Visit: Payer: Self-pay

## 2019-07-22 DIAGNOSIS — Z20822 Contact with and (suspected) exposure to covid-19: Secondary | ICD-10-CM

## 2019-07-24 LAB — NOVEL CORONAVIRUS, NAA: SARS-CoV-2, NAA: NOT DETECTED

## 2019-08-17 ENCOUNTER — Ambulatory Visit: Payer: Medicare Other | Attending: Internal Medicine

## 2019-08-17 DIAGNOSIS — Z20822 Contact with and (suspected) exposure to covid-19: Secondary | ICD-10-CM | POA: Diagnosis not present

## 2019-08-19 LAB — NOVEL CORONAVIRUS, NAA: SARS-CoV-2, NAA: DETECTED — AB

## 2019-08-23 DIAGNOSIS — U071 COVID-19: Secondary | ICD-10-CM | POA: Diagnosis not present

## 2019-08-23 DIAGNOSIS — Z888 Allergy status to other drugs, medicaments and biological substances status: Secondary | ICD-10-CM | POA: Diagnosis not present

## 2019-08-23 DIAGNOSIS — R112 Nausea with vomiting, unspecified: Secondary | ICD-10-CM | POA: Diagnosis not present

## 2019-08-23 DIAGNOSIS — Z885 Allergy status to narcotic agent status: Secondary | ICD-10-CM | POA: Diagnosis not present

## 2019-08-23 DIAGNOSIS — R079 Chest pain, unspecified: Secondary | ICD-10-CM | POA: Diagnosis not present

## 2019-08-23 DIAGNOSIS — R1013 Epigastric pain: Secondary | ICD-10-CM | POA: Diagnosis not present

## 2019-08-23 DIAGNOSIS — J1282 Pneumonia due to coronavirus disease 2019: Secondary | ICD-10-CM | POA: Diagnosis not present

## 2019-08-23 DIAGNOSIS — Z9049 Acquired absence of other specified parts of digestive tract: Secondary | ICD-10-CM | POA: Diagnosis not present

## 2019-08-23 DIAGNOSIS — R509 Fever, unspecified: Secondary | ICD-10-CM | POA: Diagnosis not present

## 2019-08-23 DIAGNOSIS — Z87891 Personal history of nicotine dependence: Secondary | ICD-10-CM | POA: Diagnosis not present

## 2019-09-02 DIAGNOSIS — K861 Other chronic pancreatitis: Secondary | ICD-10-CM | POA: Diagnosis not present

## 2019-09-14 DIAGNOSIS — K140 Glossitis: Secondary | ICD-10-CM | POA: Diagnosis not present

## 2019-09-14 DIAGNOSIS — R638 Other symptoms and signs concerning food and fluid intake: Secondary | ICD-10-CM | POA: Diagnosis not present

## 2019-09-14 DIAGNOSIS — B370 Candidal stomatitis: Secondary | ICD-10-CM | POA: Diagnosis not present

## 2019-09-14 DIAGNOSIS — G4700 Insomnia, unspecified: Secondary | ICD-10-CM | POA: Diagnosis not present

## 2019-09-14 DIAGNOSIS — R591 Generalized enlarged lymph nodes: Secondary | ICD-10-CM | POA: Diagnosis not present

## 2019-09-14 DIAGNOSIS — Z8616 Personal history of COVID-19: Secondary | ICD-10-CM | POA: Diagnosis not present

## 2019-09-14 DIAGNOSIS — K120 Recurrent oral aphthae: Secondary | ICD-10-CM | POA: Diagnosis not present

## 2019-09-14 DIAGNOSIS — J189 Pneumonia, unspecified organism: Secondary | ICD-10-CM | POA: Diagnosis not present

## 2019-09-21 DIAGNOSIS — R591 Generalized enlarged lymph nodes: Secondary | ICD-10-CM | POA: Diagnosis not present

## 2019-09-21 DIAGNOSIS — Z8616 Personal history of COVID-19: Secondary | ICD-10-CM | POA: Diagnosis not present

## 2019-09-21 DIAGNOSIS — G4700 Insomnia, unspecified: Secondary | ICD-10-CM | POA: Diagnosis not present

## 2019-09-21 DIAGNOSIS — R61 Generalized hyperhidrosis: Secondary | ICD-10-CM | POA: Diagnosis not present

## 2019-09-21 DIAGNOSIS — B370 Candidal stomatitis: Secondary | ICD-10-CM | POA: Diagnosis not present

## 2019-12-16 DIAGNOSIS — B001 Herpesviral vesicular dermatitis: Secondary | ICD-10-CM | POA: Diagnosis not present

## 2020-01-24 DIAGNOSIS — B078 Other viral warts: Secondary | ICD-10-CM | POA: Diagnosis not present

## 2020-01-24 DIAGNOSIS — D235 Other benign neoplasm of skin of trunk: Secondary | ICD-10-CM | POA: Diagnosis not present

## 2020-01-24 DIAGNOSIS — L738 Other specified follicular disorders: Secondary | ICD-10-CM | POA: Diagnosis not present

## 2020-02-22 DIAGNOSIS — M791 Myalgia, unspecified site: Secondary | ICD-10-CM | POA: Diagnosis not present

## 2020-02-22 DIAGNOSIS — M792 Neuralgia and neuritis, unspecified: Secondary | ICD-10-CM | POA: Diagnosis not present

## 2020-02-22 DIAGNOSIS — K861 Other chronic pancreatitis: Secondary | ICD-10-CM | POA: Diagnosis not present

## 2020-02-22 DIAGNOSIS — R109 Unspecified abdominal pain: Secondary | ICD-10-CM | POA: Diagnosis not present

## 2020-02-22 DIAGNOSIS — M47816 Spondylosis without myelopathy or radiculopathy, lumbar region: Secondary | ICD-10-CM | POA: Diagnosis not present

## 2020-02-22 DIAGNOSIS — M461 Sacroiliitis, not elsewhere classified: Secondary | ICD-10-CM | POA: Diagnosis not present

## 2020-02-22 DIAGNOSIS — G8929 Other chronic pain: Secondary | ICD-10-CM | POA: Diagnosis not present

## 2020-02-22 DIAGNOSIS — M5136 Other intervertebral disc degeneration, lumbar region: Secondary | ICD-10-CM | POA: Diagnosis not present

## 2020-02-22 DIAGNOSIS — M545 Low back pain: Secondary | ICD-10-CM | POA: Diagnosis not present

## 2020-02-28 DIAGNOSIS — K8689 Other specified diseases of pancreas: Secondary | ICD-10-CM | POA: Diagnosis not present

## 2020-02-28 DIAGNOSIS — Z0000 Encounter for general adult medical examination without abnormal findings: Secondary | ICD-10-CM | POA: Diagnosis not present

## 2020-02-28 DIAGNOSIS — E039 Hypothyroidism, unspecified: Secondary | ICD-10-CM | POA: Diagnosis not present

## 2020-03-02 DIAGNOSIS — K861 Other chronic pancreatitis: Secondary | ICD-10-CM | POA: Diagnosis not present

## 2020-03-02 DIAGNOSIS — Z0000 Encounter for general adult medical examination without abnormal findings: Secondary | ICD-10-CM | POA: Diagnosis not present

## 2020-03-30 DIAGNOSIS — G588 Other specified mononeuropathies: Secondary | ICD-10-CM | POA: Diagnosis not present

## 2020-03-30 DIAGNOSIS — K861 Other chronic pancreatitis: Secondary | ICD-10-CM | POA: Diagnosis not present

## 2020-03-30 DIAGNOSIS — M7918 Myalgia, other site: Secondary | ICD-10-CM | POA: Diagnosis not present

## 2020-03-30 DIAGNOSIS — M5136 Other intervertebral disc degeneration, lumbar region: Secondary | ICD-10-CM | POA: Diagnosis not present

## 2020-03-30 DIAGNOSIS — M792 Neuralgia and neuritis, unspecified: Secondary | ICD-10-CM | POA: Diagnosis not present

## 2020-03-30 DIAGNOSIS — R109 Unspecified abdominal pain: Secondary | ICD-10-CM | POA: Diagnosis not present

## 2020-03-30 DIAGNOSIS — M461 Sacroiliitis, not elsewhere classified: Secondary | ICD-10-CM | POA: Diagnosis not present

## 2020-04-27 DIAGNOSIS — Z23 Encounter for immunization: Secondary | ICD-10-CM | POA: Diagnosis not present

## 2020-05-01 DIAGNOSIS — M47816 Spondylosis without myelopathy or radiculopathy, lumbar region: Secondary | ICD-10-CM | POA: Diagnosis not present

## 2020-05-01 DIAGNOSIS — M461 Sacroiliitis, not elsewhere classified: Secondary | ICD-10-CM | POA: Diagnosis not present

## 2020-05-01 DIAGNOSIS — Z87891 Personal history of nicotine dependence: Secondary | ICD-10-CM | POA: Diagnosis not present

## 2020-05-01 DIAGNOSIS — G629 Polyneuropathy, unspecified: Secondary | ICD-10-CM | POA: Diagnosis not present

## 2020-05-01 DIAGNOSIS — M7918 Myalgia, other site: Secondary | ICD-10-CM | POA: Diagnosis not present

## 2020-05-01 DIAGNOSIS — K861 Other chronic pancreatitis: Secondary | ICD-10-CM | POA: Diagnosis not present

## 2020-05-01 DIAGNOSIS — M5136 Other intervertebral disc degeneration, lumbar region: Secondary | ICD-10-CM | POA: Diagnosis not present

## 2020-05-12 DIAGNOSIS — M542 Cervicalgia: Secondary | ICD-10-CM | POA: Diagnosis not present

## 2020-05-12 DIAGNOSIS — M25512 Pain in left shoulder: Secondary | ICD-10-CM | POA: Diagnosis not present

## 2020-06-14 DIAGNOSIS — M25512 Pain in left shoulder: Secondary | ICD-10-CM | POA: Diagnosis not present

## 2020-08-22 DIAGNOSIS — Z124 Encounter for screening for malignant neoplasm of cervix: Secondary | ICD-10-CM | POA: Diagnosis not present

## 2020-08-22 DIAGNOSIS — Z6828 Body mass index (BMI) 28.0-28.9, adult: Secondary | ICD-10-CM | POA: Diagnosis not present

## 2020-08-22 DIAGNOSIS — N951 Menopausal and female climacteric states: Secondary | ICD-10-CM | POA: Diagnosis not present

## 2020-08-24 DIAGNOSIS — Z560 Unemployment, unspecified: Secondary | ICD-10-CM | POA: Diagnosis not present

## 2020-08-24 DIAGNOSIS — R1084 Generalized abdominal pain: Secondary | ICD-10-CM | POA: Diagnosis not present

## 2020-08-24 DIAGNOSIS — M7918 Myalgia, other site: Secondary | ICD-10-CM | POA: Diagnosis not present

## 2020-08-24 DIAGNOSIS — G8929 Other chronic pain: Secondary | ICD-10-CM | POA: Diagnosis not present

## 2020-08-24 DIAGNOSIS — M5136 Other intervertebral disc degeneration, lumbar region: Secondary | ICD-10-CM | POA: Diagnosis not present

## 2020-08-24 DIAGNOSIS — M461 Sacroiliitis, not elsewhere classified: Secondary | ICD-10-CM | POA: Diagnosis not present

## 2020-08-24 DIAGNOSIS — Z87891 Personal history of nicotine dependence: Secondary | ICD-10-CM | POA: Diagnosis not present

## 2020-08-24 DIAGNOSIS — G629 Polyneuropathy, unspecified: Secondary | ICD-10-CM | POA: Diagnosis not present

## 2020-08-24 DIAGNOSIS — M47816 Spondylosis without myelopathy or radiculopathy, lumbar region: Secondary | ICD-10-CM | POA: Diagnosis not present

## 2020-08-24 DIAGNOSIS — K861 Other chronic pancreatitis: Secondary | ICD-10-CM | POA: Diagnosis not present

## 2020-09-13 ENCOUNTER — Other Ambulatory Visit: Payer: Self-pay

## 2020-09-13 ENCOUNTER — Other Ambulatory Visit: Payer: 59

## 2020-09-13 DIAGNOSIS — Z20822 Contact with and (suspected) exposure to covid-19: Secondary | ICD-10-CM

## 2020-09-15 LAB — SARS-COV-2, NAA 2 DAY TAT

## 2020-09-15 LAB — NOVEL CORONAVIRUS, NAA: SARS-CoV-2, NAA: NOT DETECTED

## 2020-10-04 DIAGNOSIS — M8588 Other specified disorders of bone density and structure, other site: Secondary | ICD-10-CM | POA: Diagnosis not present

## 2020-10-04 DIAGNOSIS — N958 Other specified menopausal and perimenopausal disorders: Secondary | ICD-10-CM | POA: Diagnosis not present

## 2020-10-04 DIAGNOSIS — Z1231 Encounter for screening mammogram for malignant neoplasm of breast: Secondary | ICD-10-CM | POA: Diagnosis not present

## 2020-10-06 DIAGNOSIS — H524 Presbyopia: Secondary | ICD-10-CM | POA: Diagnosis not present

## 2020-10-18 DIAGNOSIS — H52223 Regular astigmatism, bilateral: Secondary | ICD-10-CM | POA: Diagnosis not present

## 2020-10-18 DIAGNOSIS — H524 Presbyopia: Secondary | ICD-10-CM | POA: Diagnosis not present

## 2020-11-13 DIAGNOSIS — M533 Sacrococcygeal disorders, not elsewhere classified: Secondary | ICD-10-CM | POA: Diagnosis not present

## 2020-11-13 DIAGNOSIS — K861 Other chronic pancreatitis: Secondary | ICD-10-CM | POA: Diagnosis not present

## 2020-11-13 DIAGNOSIS — M7918 Myalgia, other site: Secondary | ICD-10-CM | POA: Diagnosis not present

## 2020-11-13 DIAGNOSIS — M461 Sacroiliitis, not elsewhere classified: Secondary | ICD-10-CM | POA: Diagnosis not present

## 2020-11-13 DIAGNOSIS — Z87891 Personal history of nicotine dependence: Secondary | ICD-10-CM | POA: Diagnosis not present

## 2020-11-13 DIAGNOSIS — M5136 Other intervertebral disc degeneration, lumbar region: Secondary | ICD-10-CM | POA: Diagnosis not present

## 2021-02-16 DIAGNOSIS — W57XXXA Bitten or stung by nonvenomous insect and other nonvenomous arthropods, initial encounter: Secondary | ICD-10-CM | POA: Diagnosis not present

## 2021-02-16 DIAGNOSIS — S30861A Insect bite (nonvenomous) of abdominal wall, initial encounter: Secondary | ICD-10-CM | POA: Diagnosis not present

## 2021-03-01 DIAGNOSIS — K861 Other chronic pancreatitis: Secondary | ICD-10-CM | POA: Diagnosis not present

## 2021-03-01 DIAGNOSIS — R109 Unspecified abdominal pain: Secondary | ICD-10-CM | POA: Diagnosis not present

## 2021-03-01 DIAGNOSIS — R1084 Generalized abdominal pain: Secondary | ICD-10-CM | POA: Diagnosis not present

## 2021-03-01 DIAGNOSIS — M47816 Spondylosis without myelopathy or radiculopathy, lumbar region: Secondary | ICD-10-CM | POA: Diagnosis not present

## 2021-03-01 DIAGNOSIS — G8929 Other chronic pain: Secondary | ICD-10-CM | POA: Diagnosis not present

## 2021-03-01 DIAGNOSIS — M5136 Other intervertebral disc degeneration, lumbar region: Secondary | ICD-10-CM | POA: Diagnosis not present

## 2021-03-01 DIAGNOSIS — M461 Sacroiliitis, not elsewhere classified: Secondary | ICD-10-CM | POA: Diagnosis not present

## 2021-03-01 DIAGNOSIS — M7918 Myalgia, other site: Secondary | ICD-10-CM | POA: Diagnosis not present

## 2021-03-01 DIAGNOSIS — Z87891 Personal history of nicotine dependence: Secondary | ICD-10-CM | POA: Diagnosis not present

## 2021-03-01 DIAGNOSIS — G629 Polyneuropathy, unspecified: Secondary | ICD-10-CM | POA: Diagnosis not present

## 2021-03-12 DIAGNOSIS — Z79899 Other long term (current) drug therapy: Secondary | ICD-10-CM | POA: Diagnosis not present

## 2021-03-12 DIAGNOSIS — Z0000 Encounter for general adult medical examination without abnormal findings: Secondary | ICD-10-CM | POA: Diagnosis not present

## 2021-03-12 DIAGNOSIS — R5383 Other fatigue: Secondary | ICD-10-CM | POA: Diagnosis not present

## 2021-03-12 DIAGNOSIS — E039 Hypothyroidism, unspecified: Secondary | ICD-10-CM | POA: Diagnosis not present

## 2021-03-12 DIAGNOSIS — K861 Other chronic pancreatitis: Secondary | ICD-10-CM | POA: Diagnosis not present

## 2021-03-15 ENCOUNTER — Emergency Department (INDEPENDENT_AMBULATORY_CARE_PROVIDER_SITE_OTHER): Payer: Medicare HMO

## 2021-03-15 ENCOUNTER — Other Ambulatory Visit: Payer: Self-pay

## 2021-03-15 ENCOUNTER — Emergency Department
Admission: RE | Admit: 2021-03-15 | Discharge: 2021-03-15 | Disposition: A | Discharge status: Home / Self Care | Payer: Medicare HMO | Source: Ambulatory Visit

## 2021-03-15 VITALS — BP 122/86 | HR 81 | Temp 98.7°F | Resp 18

## 2021-03-15 DIAGNOSIS — J40 Bronchitis, not specified as acute or chronic: Secondary | ICD-10-CM

## 2021-03-15 DIAGNOSIS — R509 Fever, unspecified: Secondary | ICD-10-CM | POA: Diagnosis not present

## 2021-03-15 DIAGNOSIS — R059 Cough, unspecified: Secondary | ICD-10-CM | POA: Diagnosis not present

## 2021-03-15 LAB — POC SARS CORONAVIRUS 2 AG -  ED: SARS Coronavirus 2 Ag: NEGATIVE

## 2021-03-15 MED ORDER — BENZONATATE 200 MG PO CAPS: 200.0000 mg | ORAL_CAPSULE | Freq: Three times a day (TID) | ORAL | 0 refills | Status: AC | PRN

## 2021-03-15 MED ORDER — PREDNISONE 20 MG PO TABS: ORAL_TABLET | ORAL | 0 refills | Status: AC

## 2021-03-15 MED ORDER — AMOXICILLIN-POT CLAVULANATE 875-125 MG PO TABS: 1.0000 | ORAL_TABLET | Freq: Two times a day (BID) | ORAL | 0 refills | Status: AC

## 2021-03-15 NOTE — Discharge Instructions (Addendum)
Advised/instructed patient to take medications as directed with food to completion. Advised/encouraged patient increase daily water intake while taking these medications.

## 2021-03-15 NOTE — ED Triage Notes (Signed)
Pt c/o fever, cough, runny nose and chills since Tuesday. Temp 100.5 fever this am. Advil at 930am. Diarrhea this am. Son sick last week, UC covid test neg. Sudafed and guaifenesin prn.

## 2021-03-15 NOTE — ED Provider Notes (Signed)
[name]    CSN: [id number] Arrival date & time: 03/15/21  1019      History   Chief Complaint Chief Complaint  Patient presents with   Fever   Nasal Congestion   Cough    HPI Cassie Berry is a 55 y.o. female.   HPI 55 year old female presents with fever, cough, runny nose, and chills since Tuesday.  Reports oral temperature of 100.5 this morning and took Advil at 9:30 AM.  Additionally, patient reports bout of diarrhea this morning.  Past Medical History:  Diagnosis Date   ADD (attention deficit disorder)    Anxiety    Depression    Diverticulosis    Duodenal stenosis    Duodenitis    DVT (deep venous thrombosis) (HCC)    GERD (gastroesophageal reflux disease)    Migraine headache    Pancreatitis    Pneumonia    02/2011   PONV (postoperative nausea and vomiting)    Seizures ([place])    pt thinks she may have had febrile seizure in hospital last year june 2013    Patient Active Problem List   Diagnosis Date Noted   Bowel habit changes 03/19/2013   Abdominal pain, other specified site 03/19/2013   GERD (gastroesophageal reflux disease) 03/19/2013   Pancreatic duct stricture 07/31/2012   Long term (current) use of anticoagulants 03/06/2012   Nausea with vomiting 02/18/2012   Deep vein thrombosis, upper right extremity ([place]) 02/12/2012   Feeding difficulties 02/11/2012   Nonspecific (abnormal) findings on radiological and other examination of gastrointestinal tract 02/11/2012   Anemia 02/01/2012   Bilateral pleural effusion 01/30/2012   Fever 01/30/2012   Hyponatremia 01/30/2012   Acute pancreatitis 01/23/2012   Right hip pain 11/28/2010   Greater trochanteric bursitis 11/28/2010   Leg length inequality 11/28/2010   ABDOMINAL PAIN-RUQ 09/05/2009   ABDOMINAL PAIN, EPIGASTRIC 08/21/2009    Past Surgical History:  Procedure Laterality Date   APPENDECTOMY     CHOLECYSTECTOMY     ERCP  01/23/2012   Procedure: ENDOSCOPIC RETROGRADE  CHOLANGIOPANCREATOGRAPHY (ERCP);  Surgeon: Inda Castle, MD;  Location: [facility];  Service: Endoscopy;  Laterality: N/A;   ESOPHAGOGASTRODUODENOSCOPY  01/25/2012   Procedure: ESOPHAGOGASTRODUODENOSCOPY (EGD);  Surgeon: Milus Banister, MD;  Location: [facility];  Service: Endoscopy;  Laterality: N/A;   ESOPHAGOGASTRODUODENOSCOPY  02/20/2012   Procedure: ESOPHAGOGASTRODUODENOSCOPY (EGD);  Surgeon: Ladene Artist, MD,FACG;  Location: [facility];  Service: Endoscopy;  Laterality: N/A;  ?feeding tube placement at same time post pyloric tube   SPHINCTEROTOMY  01/23/2012   Procedure: SPHINCTEROTOMY;  Surgeon: Inda Castle, MD;  Location: [facility];  Service: Endoscopy;  Laterality: N/A;    OB History   No obstetric history on file.      Home Medications    Prior to Admission medications   Medication Sig Start Date End Date Taking? Authorizing Provider  amoxicillin-clavulanate (AUGMENTIN) 875-125 MG tablet Take 1 tablet by mouth every 12 (twelve) hours for 5 days. 03/15/21 03/20/21 Yes Eliezer Lofts, FNP  benzonatate (TESSALON) 200 MG capsule Take 1 capsule (200 mg total) by mouth 3 (three) times daily as needed for up to 7 days for cough. 03/15/21 03/22/21 Yes Eliezer Lofts, FNP  escitalopram (LEXAPRO) 10 MG tablet Take by mouth. 01/09/16  Yes [provider]  predniSONE (DELTASONE) 20 MG tablet Take 3 tabs PO daily x 5 days. 03/15/21  Yes Eliezer Lofts, FNP  buPROPion (WELLBUTRIN XL) 300 MG 24 hr tablet Take 300 mg by mouth  every morning.     [provider]  cetirizine (ZYRTEC ALLERGY) 10 MG tablet Take 10 mg by mouth daily.    [provider]  cholecalciferol (VITAMIN D) 1000 UNITS tablet Take 2,000 Units by mouth daily. Does not take every day    [provider]  dicyclomine (BENTYL) 10 MG capsule Take 10 mg by mouth 2 (two) times daily as needed for spasms.    [provider]  estradiol (VIVELLE-DOT) 0.05 MG/24HR patch Place 1 patch onto  the skin 2 (two) times a week. 02/06/21   [provider]  fluticasone (FLONASE) 50 MCG/ACT nasal spray Place 1 spray into the nose daily.    [provider]  lipase/protease/amylase (CREON) 36000 UNITS CPEP capsule Creon 36,000 unit-114,000 unit-180,000 unit capsule,delayed release    [provider]  LORazepam (ATIVAN) 0.5 MG tablet Take 1 mg by mouth at bedtime. As needed    [provider]  magnesium citrate SOLN Take 1 Bottle by mouth daily as needed for severe constipation.    [provider]  methocarbamol (ROBAXIN) 500 MG tablet Take 500 mg by mouth 3 (three) times daily as needed. 02/01/21   [provider]  morphine (MSIR) 15 MG tablet Take 7.5 mg by mouth every 4 (four) hours as needed for severe pain.    [provider]  ondansetron (ZOFRAN) 4 MG tablet Take 1 tablet (4 mg total) by mouth every 8 (eight) hours as needed for nausea or vomiting. 02/02/14   Kenton Kingfisher, Abigail, PA-C  Pancrelipase, Lip-Prot-Amyl, 36000 UNITS CPEP Take 2 capsules by mouth 3 (three) times daily. 5 cap. Three times daily before meals,20 min. Before meals, 2-3 cap. With snacks. 08/19/13   Irene Shipper, MD  pantoprazole (PROTONIX) 40 MG tablet Take 1 tablet by mouth daily. 01/10/14   [provider]  Polyethylene Glycol 3350 (MIRALAX PO) Take by mouth daily as needed.    [provider]  promethazine (PHENERGAN) 25 MG tablet Take 1 tablet (25 mg total) by mouth every 6 (six) hours as needed for nausea. 07/07/13   Irene Shipper, MD  Suvorexant (BELSOMRA) 10 MG TABS Take 10 mg by mouth daily as needed (for sleep).    [provider]    Family History Family History  Problem Relation Age of Onset   Lung cancer Mother    Diabetes Father    Heart disease Father    Hypertension Father    Colon cancer Cousin        Paternal side   Colon cancer Paternal Grandfather    Prostate cancer Paternal Grandfather    Breast cancer Maternal  Aunt    Diabetes Sister    Hypertension Brother    Hyperlipidemia Brother     Social History Social History   Tobacco Use   Smoking status: Former    Types: Cigarettes    Quit date: 08/12/1988    Years since quitting: 32.6   Smokeless tobacco: Never  Substance Use Topics   Alcohol use: Yes    Comment: Occasional   Drug use: No     Allergies   Macrodantin, Dilaudid [hydromorphone hcl], and Nitrofurantoin   Review of Systems Review of Systems  Constitutional:  Positive for chills and fever.  HENT:  Positive for congestion and rhinorrhea.   Respiratory:  Positive for cough.   Gastrointestinal:  Positive for diarrhea.  All other systems reviewed and are negative.   Physical Exam Triage Vital Signs ED Triage Vitals  Enc  Vitals Group     BP 03/15/21 1036 122/86     Pulse Rate 03/15/21 1036 81     Resp 03/15/21 1036 18     Temp 03/15/21 1036 98.7 F (37.1 C)     Temp Source 03/15/21 1036 Oral     SpO2 03/15/21 1036 97 %     Weight --      Height --      Head Circumference --      Peak Flow --      Pain Score 03/15/21 1038 0     Pain Loc --      Pain Edu? --      Excl. in [place]? --    No data found.  Updated Vital Signs BP 122/86 (BP Location: Right Arm)   Pulse 81   Temp 98.7 F (37.1 C) (Oral)   Resp 18   LMP 12/11/2010   SpO2 97%      Physical Exam Vitals and nursing note reviewed.  Constitutional:      General: She is not in acute distress.    Appearance: Normal appearance. She is normal weight. She is ill-appearing.  HENT:     Head: Normocephalic and atraumatic.     Right Ear: Tympanic membrane, ear canal and external ear normal.     Left Ear: Tympanic membrane, ear canal and external ear normal.     Nose: Congestion and rhinorrhea present.     Mouth/Throat:     Mouth: Mucous membranes are moist.     Pharynx: Oropharynx is clear.  Eyes:     Extraocular Movements: Extraocular movements intact.     Conjunctiva/sclera: Conjunctivae normal.      Pupils: Pupils are equal, round, and reactive to light.  Cardiovascular:     Rate and Rhythm: Normal rate and regular rhythm.     Pulses: Normal pulses.     Heart sounds: Normal heart sounds.  Pulmonary:     Effort: Pulmonary effort is normal.     Comments: Diffuse scattered rhonchi noted throughout, decreased breath sounds bibasilarly Musculoskeletal:        General: Normal range of motion.     Cervical back: Normal range of motion and neck supple. Tenderness present.  Lymphadenopathy:     Cervical: Cervical adenopathy present.  Skin:    General: Skin is warm and dry.  Neurological:     General: No focal deficit present.     Mental Status: She is alert and oriented to person, place, and time. Mental status is at baseline.  Psychiatric:        Mood and Affect: Mood normal.        Behavior: Behavior normal.        Thought Content: Thought content normal.     UC Treatments / Results  Labs (all labs ordered are listed, but only abnormal results are displayed) Labs Reviewed  POC SARS CORONAVIRUS 2 AG -  ED    EKG   Radiology DG Chest 2 View  Result Date: 03/15/2021 CLINICAL DATA:  Cough. Additional history provided: Productive cough, fever for 3 days. EXAM: CHEST - 2 VIEW COMPARISON:  Report from chest radiograph 09/14/2019 (images unavailable). Chest radiographs 02/02/2014. FINDINGS: Heart size within normal limits. No appreciable airspace consolidation. No evidence of pleural effusion or pneumothorax. Redemonstrated calcified right hilar lymph node, which may reflect sequela of prior granulomatous disease. No acute bony abnormality identified. Surgical clips within the right upper quadrant of the abdomen. IMPRESSION: No evidence of acute cardiopulmonary  abnormality. Redemonstrated calcified right hilar lymph node, which may reflect sequela of prior granulomatous disease. Electronically Signed   By: Kellie Simmering DO   On: 03/15/2021 12:30    Procedures Procedures (including  critical care time)  Medications Ordered in UC Medications - No data to display  Initial Impression / Assessment and Plan / UC Course  I have reviewed the triage vital signs and the nursing notes.  Pertinent labs & imaging results that were available during my care of the patient were reviewed by me and considered in my medical decision making (see chart for details).   MDM: 1. Cough-Rx'd Augmentin and Tessalon Perles; 2.  Bronchitis-Rx'd prednisone burst. Advised/instructed patient to take medications as directed with food to completion. Advised/encouraged patient increase daily water intake while taking these medications.  Patient discharged home, hemodynamically stable. Final Clinical Impressions(s) / UC Diagnoses   Final diagnoses:  Cough  Bronchitis     Discharge Instructions      Advised/instructed patient to take medications as directed with food to completion. Advised/encouraged patient increase daily water intake while taking these medications.     ED Prescriptions     Medication Sig Dispense Auth. Provider   amoxicillin-clavulanate (AUGMENTIN) 875-125 MG tablet Take 1 tablet by mouth every 12 (twelve) hours for 5 days. 10 tablet Eliezer Lofts, FNP   predniSONE (DELTASONE) 20 MG tablet Take 3 tabs PO daily x 5 days. 15 tablet Eliezer Lofts, FNP   benzonatate (TESSALON) 200 MG capsule Take 1 capsule (200 mg total) by mouth 3 (three) times daily as needed for up to 7 days for cough. 30 capsule Eliezer Lofts, FNP      PDMP not reviewed this encounter.   Eliezer Lofts, [place] 03/15/21 1301

## 2021-03-22 DIAGNOSIS — R9389 Abnormal findings on diagnostic imaging of other specified body structures: Secondary | ICD-10-CM | POA: Diagnosis not present

## 2021-03-22 DIAGNOSIS — H9313 Tinnitus, bilateral: Secondary | ICD-10-CM | POA: Diagnosis not present

## 2021-03-22 DIAGNOSIS — Z0000 Encounter for general adult medical examination without abnormal findings: Secondary | ICD-10-CM | POA: Diagnosis not present

## 2021-04-11 DIAGNOSIS — H6983 Other specified disorders of Eustachian tube, bilateral: Secondary | ICD-10-CM | POA: Diagnosis not present

## 2021-04-11 DIAGNOSIS — H93293 Other abnormal auditory perceptions, bilateral: Secondary | ICD-10-CM | POA: Diagnosis not present

## 2021-04-11 DIAGNOSIS — H9313 Tinnitus, bilateral: Secondary | ICD-10-CM | POA: Diagnosis not present

## 2021-07-12 DIAGNOSIS — Z87891 Personal history of nicotine dependence: Secondary | ICD-10-CM | POA: Diagnosis not present

## 2021-07-12 DIAGNOSIS — Z23 Encounter for immunization: Secondary | ICD-10-CM | POA: Diagnosis not present

## 2021-07-12 DIAGNOSIS — Z9049 Acquired absence of other specified parts of digestive tract: Secondary | ICD-10-CM | POA: Diagnosis not present

## 2021-07-12 DIAGNOSIS — Z79899 Other long term (current) drug therapy: Secondary | ICD-10-CM | POA: Diagnosis not present

## 2021-07-12 DIAGNOSIS — K859 Acute pancreatitis without necrosis or infection, unspecified: Secondary | ICD-10-CM | POA: Diagnosis not present

## 2021-08-16 DIAGNOSIS — K859 Acute pancreatitis without necrosis or infection, unspecified: Secondary | ICD-10-CM | POA: Diagnosis not present

## 2021-08-16 DIAGNOSIS — K8689 Other specified diseases of pancreas: Secondary | ICD-10-CM | POA: Diagnosis not present

## 2021-12-31 DIAGNOSIS — M47816 Spondylosis without myelopathy or radiculopathy, lumbar region: Secondary | ICD-10-CM | POA: Diagnosis not present

## 2022-01-04 DIAGNOSIS — M47896 Other spondylosis, lumbar region: Secondary | ICD-10-CM | POA: Diagnosis not present

## 2022-01-30 DIAGNOSIS — M47896 Other spondylosis, lumbar region: Secondary | ICD-10-CM | POA: Diagnosis not present

## 2022-01-30 DIAGNOSIS — M1712 Unilateral primary osteoarthritis, left knee: Secondary | ICD-10-CM | POA: Diagnosis not present

## 2022-03-29 ENCOUNTER — Other Ambulatory Visit: Payer: Self-pay

## 2022-03-29 NOTE — Patient Outreach (Signed)
[place]) Care Management  03/29/2022  Cassie Berry 06/24/1966 [id number]   Telephone call to patient for nurse call.  No answer.  HIPAA compliant voice message left.    Plan: RN CM will attempt again within 4 business days and send letter.   Jone Baseman, RN, MSN [facility] Care Management Care Management Coordinator Direct Line [phone] Toll Free: [phone]  Fax: [phone]

## 2022-04-02 ENCOUNTER — Other Ambulatory Visit: Payer: Self-pay

## 2022-04-02 NOTE — Patient Outreach (Signed)
[place] [facility]) Care Management  04/02/2022  YISSEL HABERMEHL 09/03/1965 [id number]   Telephone call to patient for nurse call.  No answer.  HIPAA compliant voice message left.    Plan: RN CM will attempt again within 4 business days.  Jone Baseman, RN, MSN [facility] Care Management Care Management Coordinator Direct Line [phone] Toll Free: [phone]  Fax: [phone]

## 2022-04-05 ENCOUNTER — Other Ambulatory Visit: Payer: Self-pay

## 2022-04-05 NOTE — Patient Outreach (Signed)
[place] [facility]) Care Management  04/05/2022  DEIJAH SPIKES 1966/03/05 [id number]   Telephone call to patient for nurse call.  No answer.  HIPAA compliant voice message left.    Plan: RN CM will close case.  Jone Baseman, RN, MSN [facility] Care Management Care Management Coordinator Direct Line [phone] Toll Free: [phone]  Fax: [phone]

## 2022-04-16 DIAGNOSIS — Z0000 Encounter for general adult medical examination without abnormal findings: Secondary | ICD-10-CM | POA: Diagnosis not present

## 2022-04-16 DIAGNOSIS — E039 Hypothyroidism, unspecified: Secondary | ICD-10-CM | POA: Diagnosis not present

## 2022-04-16 DIAGNOSIS — K861 Other chronic pancreatitis: Secondary | ICD-10-CM | POA: Diagnosis not present

## 2022-04-16 DIAGNOSIS — R5383 Other fatigue: Secondary | ICD-10-CM | POA: Diagnosis not present

## 2022-04-17 DIAGNOSIS — E039 Hypothyroidism, unspecified: Secondary | ICD-10-CM | POA: Diagnosis not present

## 2022-04-17 DIAGNOSIS — Z0000 Encounter for general adult medical examination without abnormal findings: Secondary | ICD-10-CM | POA: Diagnosis not present

## 2022-04-17 DIAGNOSIS — Z79899 Other long term (current) drug therapy: Secondary | ICD-10-CM | POA: Diagnosis not present

## 2022-12-12 DIAGNOSIS — Z124 Encounter for screening for malignant neoplasm of cervix: Secondary | ICD-10-CM | POA: Diagnosis not present

## 2022-12-12 DIAGNOSIS — M8588 Other specified disorders of bone density and structure, other site: Secondary | ICD-10-CM | POA: Diagnosis not present

## 2022-12-12 DIAGNOSIS — N951 Menopausal and female climacteric states: Secondary | ICD-10-CM | POA: Diagnosis not present

## 2022-12-12 DIAGNOSIS — Z1231 Encounter for screening mammogram for malignant neoplasm of breast: Secondary | ICD-10-CM | POA: Diagnosis not present

## 2022-12-12 DIAGNOSIS — Z6826 Body mass index (BMI) 26.0-26.9, adult: Secondary | ICD-10-CM | POA: Diagnosis not present

## 2022-12-12 DIAGNOSIS — N958 Other specified menopausal and perimenopausal disorders: Secondary | ICD-10-CM | POA: Diagnosis not present

## 2023-02-26 DIAGNOSIS — H00021 Hordeolum internum right upper eyelid: Secondary | ICD-10-CM | POA: Diagnosis not present

## 2023-02-27 DIAGNOSIS — H00031 Abscess of right upper eyelid: Secondary | ICD-10-CM | POA: Diagnosis not present

## 2023-03-27 DIAGNOSIS — H0011 Chalazion right upper eyelid: Secondary | ICD-10-CM | POA: Diagnosis not present

## 2023-03-27 DIAGNOSIS — H5203 Hypermetropia, bilateral: Secondary | ICD-10-CM | POA: Diagnosis not present

## 2023-03-27 DIAGNOSIS — H524 Presbyopia: Secondary | ICD-10-CM | POA: Diagnosis not present

## 2023-03-27 DIAGNOSIS — H52223 Regular astigmatism, bilateral: Secondary | ICD-10-CM | POA: Diagnosis not present

## 2023-04-18 DIAGNOSIS — E039 Hypothyroidism, unspecified: Secondary | ICD-10-CM | POA: Diagnosis not present

## 2023-04-18 DIAGNOSIS — Z0000 Encounter for general adult medical examination without abnormal findings: Secondary | ICD-10-CM | POA: Diagnosis not present

## 2023-04-23 DIAGNOSIS — Z8249 Family history of ischemic heart disease and other diseases of the circulatory system: Secondary | ICD-10-CM | POA: Diagnosis not present

## 2023-04-23 DIAGNOSIS — G4700 Insomnia, unspecified: Secondary | ICD-10-CM | POA: Diagnosis not present

## 2023-04-23 DIAGNOSIS — Z0000 Encounter for general adult medical examination without abnormal findings: Secondary | ICD-10-CM | POA: Diagnosis not present

## 2023-04-23 DIAGNOSIS — K861 Other chronic pancreatitis: Secondary | ICD-10-CM | POA: Diagnosis not present

## 2023-05-03 ENCOUNTER — Encounter: Payer: Self-pay | Admitting: Internal Medicine

## 2023-09-11 DIAGNOSIS — R197 Diarrhea, unspecified: Secondary | ICD-10-CM | POA: Diagnosis not present

## 2023-09-11 DIAGNOSIS — K8681 Exocrine pancreatic insufficiency: Secondary | ICD-10-CM | POA: Diagnosis not present

## 2023-09-11 DIAGNOSIS — Z1211 Encounter for screening for malignant neoplasm of colon: Secondary | ICD-10-CM | POA: Diagnosis not present

## 2023-10-28 DIAGNOSIS — Z8249 Family history of ischemic heart disease and other diseases of the circulatory system: Secondary | ICD-10-CM | POA: Diagnosis not present

## 2023-10-28 DIAGNOSIS — F410 Panic disorder [episodic paroxysmal anxiety] without agoraphobia: Secondary | ICD-10-CM | POA: Diagnosis not present

## 2023-11-06 DIAGNOSIS — Z8249 Family history of ischemic heart disease and other diseases of the circulatory system: Secondary | ICD-10-CM | POA: Diagnosis not present

## 2023-11-18 DIAGNOSIS — R509 Fever, unspecified: Secondary | ICD-10-CM | POA: Diagnosis not present

## 2023-11-18 DIAGNOSIS — R5383 Other fatigue: Secondary | ICD-10-CM | POA: Diagnosis not present

## 2023-11-18 DIAGNOSIS — J069 Acute upper respiratory infection, unspecified: Secondary | ICD-10-CM | POA: Diagnosis not present

## 2023-11-18 DIAGNOSIS — N390 Urinary tract infection, site not specified: Secondary | ICD-10-CM | POA: Diagnosis not present

## 2023-11-18 DIAGNOSIS — F410 Panic disorder [episodic paroxysmal anxiety] without agoraphobia: Secondary | ICD-10-CM | POA: Diagnosis not present

## 2024-01-14 DIAGNOSIS — M1712 Unilateral primary osteoarthritis, left knee: Secondary | ICD-10-CM | POA: Diagnosis not present

## 2024-02-02 DIAGNOSIS — L03113 Cellulitis of right upper limb: Secondary | ICD-10-CM | POA: Diagnosis not present

## 2024-02-11 DIAGNOSIS — M1712 Unilateral primary osteoarthritis, left knee: Secondary | ICD-10-CM | POA: Diagnosis not present

## 2024-03-19 DIAGNOSIS — N950 Postmenopausal bleeding: Secondary | ICD-10-CM | POA: Diagnosis not present

## 2024-03-19 DIAGNOSIS — Z01419 Encounter for gynecological examination (general) (routine) without abnormal findings: Secondary | ICD-10-CM | POA: Diagnosis not present

## 2024-03-19 DIAGNOSIS — Z6826 Body mass index (BMI) 26.0-26.9, adult: Secondary | ICD-10-CM | POA: Diagnosis not present

## 2024-03-19 DIAGNOSIS — N951 Menopausal and female climacteric states: Secondary | ICD-10-CM | POA: Diagnosis not present

## 2024-03-26 DIAGNOSIS — T63061A Toxic effect of venom of other North and South American snake, accidental (unintentional), initial encounter: Secondary | ICD-10-CM | POA: Diagnosis not present

## 2024-03-26 DIAGNOSIS — Z8719 Personal history of other diseases of the digestive system: Secondary | ICD-10-CM | POA: Diagnosis not present

## 2024-03-26 DIAGNOSIS — Z87891 Personal history of nicotine dependence: Secondary | ICD-10-CM | POA: Diagnosis not present

## 2024-03-26 DIAGNOSIS — K861 Other chronic pancreatitis: Secondary | ICD-10-CM | POA: Diagnosis not present

## 2024-03-26 DIAGNOSIS — R9431 Abnormal electrocardiogram [ECG] [EKG]: Secondary | ICD-10-CM | POA: Diagnosis not present

## 2024-03-26 DIAGNOSIS — F419 Anxiety disorder, unspecified: Secondary | ICD-10-CM | POA: Diagnosis not present

## 2024-03-26 DIAGNOSIS — M7989 Other specified soft tissue disorders: Secondary | ICD-10-CM | POA: Diagnosis not present

## 2024-03-26 DIAGNOSIS — Z881 Allergy status to other antibiotic agents status: Secondary | ICD-10-CM | POA: Diagnosis not present

## 2024-03-26 DIAGNOSIS — S91332A Puncture wound without foreign body, left foot, initial encounter: Secondary | ICD-10-CM | POA: Diagnosis not present

## 2024-03-26 DIAGNOSIS — Z79818 Long term (current) use of other agents affecting estrogen receptors and estrogen levels: Secondary | ICD-10-CM | POA: Diagnosis not present

## 2024-03-26 DIAGNOSIS — F32A Depression, unspecified: Secondary | ICD-10-CM | POA: Diagnosis not present

## 2024-03-26 DIAGNOSIS — M79662 Pain in left lower leg: Secondary | ICD-10-CM | POA: Diagnosis not present

## 2024-03-26 DIAGNOSIS — Z86718 Personal history of other venous thrombosis and embolism: Secondary | ICD-10-CM | POA: Diagnosis not present

## 2024-03-27 DIAGNOSIS — Z8719 Personal history of other diseases of the digestive system: Secondary | ICD-10-CM | POA: Diagnosis not present

## 2024-03-27 DIAGNOSIS — F419 Anxiety disorder, unspecified: Secondary | ICD-10-CM | POA: Diagnosis not present

## 2024-03-27 DIAGNOSIS — Z79818 Long term (current) use of other agents affecting estrogen receptors and estrogen levels: Secondary | ICD-10-CM | POA: Diagnosis not present

## 2024-03-27 DIAGNOSIS — F32A Depression, unspecified: Secondary | ICD-10-CM | POA: Diagnosis not present

## 2024-03-28 DIAGNOSIS — F32A Depression, unspecified: Secondary | ICD-10-CM | POA: Diagnosis not present

## 2024-03-28 DIAGNOSIS — Z79818 Long term (current) use of other agents affecting estrogen receptors and estrogen levels: Secondary | ICD-10-CM | POA: Diagnosis not present

## 2024-03-28 DIAGNOSIS — Z8719 Personal history of other diseases of the digestive system: Secondary | ICD-10-CM | POA: Diagnosis not present

## 2024-03-28 DIAGNOSIS — F419 Anxiety disorder, unspecified: Secondary | ICD-10-CM | POA: Diagnosis not present

## 2024-03-29 DIAGNOSIS — Z79818 Long term (current) use of other agents affecting estrogen receptors and estrogen levels: Secondary | ICD-10-CM | POA: Diagnosis not present

## 2024-03-29 DIAGNOSIS — Z8719 Personal history of other diseases of the digestive system: Secondary | ICD-10-CM | POA: Diagnosis not present

## 2024-03-29 DIAGNOSIS — F32A Depression, unspecified: Secondary | ICD-10-CM | POA: Diagnosis not present

## 2024-03-29 DIAGNOSIS — F419 Anxiety disorder, unspecified: Secondary | ICD-10-CM | POA: Diagnosis not present

## 2024-03-29 NOTE — Nursing Note (Signed)
 Left leg measurements  Foot 23cm Ankle 22 1/2cm Calf 36 1/2cm

## 2024-03-29 NOTE — Discharge Summary (Signed)
 [facility]  [facility] Inpatient Discharge Summary  PCP: No primary care provider on file. Discharge Details   Admit date:         03/26/2024 Discharge date:        03/29/2024  Hospital Days:    2 days  Code Status:   Full Code Advanced Directives on file: No Directive        Discharge Diagnoses:  Principal Problem:   Toxic effect of copperhead snake venom Active Problems:   History of chronic pancreatitis   Anxiety and depression   Current use of estrogen therapy   Task list for follow-up: Continue to monitor patient's pain and left lower extremity edema.   Follow-Up Appointments Suggested: Follow-up with Primary Care Physician   Hospital follow up  Follow-Up Appointments Already Scheduled: No future appointments.  Discharge Medications:   Current Discharge Medication List     START taking these medications      Details  acetaminophen  500 mg tablet Commonly known as: TYLENOL   Take two tablets (1,000 mg dose) by mouth every 8 (eight) hours for 10 days.   cyclobenzaprine  10 mg tablet Commonly known as: FLEXERIL   Take one tablet (10 mg dose) by mouth 3 (three) times a day as needed for Muscle spasms for up to 10 days. Quantity: 30 tablet   oxyCODONE  HCl 10 mg immediate release tablet Commonly known as: ROXICODONE   Take one tablet (10 mg dose) by mouth every 6 (six) hours as needed for up to 3 days. Max Daily Amount: 40 mg Quantity: 6 tablet       CONTINUE these medications which have NOT CHANGED      Details  ADVIL 200 mg tablet Generic drug: ibuprofen  Take two tablets (400 mg dose) by mouth 3 (three) times a day as needed.   albuterol sulfate HFA 108 (90 Base) MCG/ACT inhaler Commonly known as: PROVENTIL,VENTOLIN,PROAIR  Inhale one puff into the lungs every 4 (four) hours as needed.   buPROPion  hcl 300 mg 24 hr tablet Commonly known as: WELLBUTRIN  XL  Take one tablet (300 mg dose) by mouth daily.   CREON  36000  units Cpep DR capsule Generic drug: pancrelipase  (lipase-protease-amylase)  Take one capsule (36,000 Units dose) by mouth see administration instructions. Take 5 caps w/meals and 2-3 caps w/snacks   escitalopram oxalate 10 mg tablet Commonly known as: LEXAPRO  Take one tablet (10 mg dose) by mouth daily.   estradiol 0.05 mg/24 hours patch Commonly known as: ESTRADERM,ALORA,VIVELLE-DOT,MINIVELLE  Place one patch onto the skin 2 (two) times a week.   fluticasone propionate 50 mcg/actuation nasal spray Commonly known as: FLONASE  one spray by Nasal route daily.   methocarbamol 500 mg tablet Commonly known as: ROBAXIN  Take one and a half tablets (750 mg dose) by mouth every 4 (four) hours.   progesterone 100 mg capsule Commonly known as: PROMETRIUM  Take one capsule (100 mg dose) by mouth at bedtime.   promethazine  25 MG tablet Commonly known as: PHENERGAN   Take one tablet (25 mg dose) by mouth every 4 (four) hours.   valacyclovir 1000 mg tablet Commonly known as: VALTREX  Take one tablet (1,000 mg dose) by mouth every 12 (twelve) hours as needed.      * You might also be taking other medications not listed above. If you have questions about any of your other medications, talk to the person who prescribed them or your Primary Care Provider.  ASK your doctor about these medications      Details  ALPRAZolam 0.5 mg tablet Commonly known as: XANAX Ask about: Should I take this medication?  Take one tablet (0.5 mg dose) by mouth 2 (two) times daily.        Allergies: Allergies[1]  Consultations this Admission: IP CONSULT TO CASE MANAGEMENT, RN/SW  Procedures/Imaging:     US  Venous Lower Extremity Left  Final Result  IMPRESSION: No evidence of DVT in the left lower extremity    Electronically Signed by: Glendia Guillaume, MD on 03/28/2024 1:35 PM      Pertinent Labs:  Cardiac Labs: Recent Labs    Units 03/26/24 2155  CK U/L 152   CBC: Recent  Labs    Units 03/27/24 0319 03/26/24 2155  WBC thou/mcL 9.0 6.1  HGB gm/dL 87.0 86.8  PLT thou/mcL 265 304   BMP: Recent Labs    Units 03/26/24 2155  NA mmol/L 137  K mmol/L 3.7  CL mmol/L 103  CO2 mmol/L 25  BUN mg/dL 18  CREATININE mg/dL 9.31   Lipid Panel: No results for input(s): CHOL, TRIG, HDL, LDL in the last 168 hours. Liver Enzymes: Recent Labs    Units 03/27/24 0319 03/26/24 2155  INR  1.2 1.2  AST U/L  --  19  ALT U/L  --  12  ALKPHOS U/L  --  60  BILITOT mg/dL  --  0.3   Endocrine Panels: Recent Labs    Units 03/26/24 2155  GLUCOSE mg/dL 90    Hospital Course   Physicians involved in care during this hospitalization Attending Provider: Lynwood JAYSON Life, MD Attending Provider: Yancy Sorrel, MD Attending Provider: Deedee Flatten, MD Attending Provider: Arlin Minion, MD Admitting Provider: Yancy Sorrel, MD Consulting Physician: Yancy Sorrel, MD Consulting Physician: Deedee Flatten, MD Consulting Physician: Cathlean FORBES Lanius, MD  Indication for Admission: Snake Bite   History of Present Illness as per H&P: Patient is a 58 year old female, with a PMH of Chronic Pancreatitis, Anxiety, and Depression, who presents to the hospital for a snake. Around 2115, patient was bitten by a cooper head snake in her left foot. Patient had severe pain after bite and presented to the ED. No fevers, chills, chest pain, shortness of breath, nausea, vomiting, or diarrhea.   Hospital Course:        Platelet, fibrinogen, INR have been stable. I talked with poison control. No more further labs are needed as long as swelling is not getting worse. Patient's left lower extremity circumferences has been stable.  Patient has tolerated third walking around with oral medication.  Patient is discharged March 29, 2024.  Instructed patient to take Tylenol , Flexeril , oxycodone  as needed for pain control.  Rolling walker was sent with patient.  Poison control will follow-up  with patient after discharge as well.     BP 130/75 (BP Location: Right Upper Arm, Patient Position: Lying)   Pulse 58   Temp 98.1 F (36.7 C) (Oral)   Resp 17   Ht 1.626 m (5' 4)   Wt 68 kg (150 lb)   SpO2 95%   BMI 25.75 kg/m   Physical Exam Constitutional:      General: She is not in acute distress.    Appearance: Normal appearance.  HENT:     Head: Normocephalic and atraumatic.     Nose: Nose normal. No congestion.     Mouth/Throat:     Mouth: Mucous membranes are moist.  Eyes:  Extraocular Movements: Extraocular movements intact.  Cardiovascular:     Rate and Rhythm: Normal rate and regular rhythm.     Pulses: Normal pulses.     Heart sounds: Normal heart sounds. No murmur heard.    No friction rub. No gallop.  Pulmonary:     Effort: Pulmonary effort is normal. No respiratory distress.     Breath sounds: Normal breath sounds. No stridor. No wheezing, rhonchi or rales.  Abdominal:     General: Abdomen is flat. Bowel sounds are normal. There is no distension.     Palpations: Abdomen is soft.     Tenderness: There is no abdominal tenderness.  Musculoskeletal:     Left lower leg: Edema present.  Skin:    General: Skin is warm.  Neurological:     General: No focal deficit present.     Mental Status: She is alert and oriented to person, place, and time.     Post Hospital Care   Activity: Activity Instructions     Activity as tolerated         Oxygen Orders for Discharge: O2 Device: None (Room air) SpO2: 95 % O2 Flow Rate (L/min): 0 L/min  Diet: Diet and Nourishment Orders (From admission, onward)     Start       03/27/24 0131  Regular Diet  Diet effective now                   Lines/Drains/Airways: Patient Lines/Drains/Airways Status     Active LDAs     Name Placement date Placement time Site Days   Peripheral IV 22 G Anterior;Left Forearm 03/27/24  2000  Forearm  1            Therapy Recommendations:  PT: Anticipated PT  needs at discharge: No PT needs anticipated Anticipated Caregiver Needs at Discharge: Supervision for safety/cues (boyfriend assist for iADLs) DME Equipment Recommendations: Rolling walker     AM-PAC Basic Mobility Raw Score (out of 24): 24 Routine Mobility Goal: Walk 250 Feet or More & All ADLs in Bathroom After Gathering Supplies in Room 8     Home Health Orders: DME Orders (From admission, onward)            Arrange for Home Use Only DME Walker / Type: Rolling  Once (Routine)       Question Answer Comment  Type: Rolling   Equipment justification Patient has mobility limitation that significantly impairs the ability to participate in MRADLs   Equipment justification Patient is able to safely use rolling walker   Equipment justification Patient's mobility deficit can sufficiently be resolved with the use of a rolling walker   Height 1.626 m (5' 4)   Weight (kg) 68                Home Health Agency     None       I spent 40 minutes performing discharge services.   Electronically signed: Deedee Flatten, MD 03/29/2024 / 9:55 AM      [1] Allergies Allergen Reactions  . Dilaudid  [Hydromorphone ] Hallucinations  . Macrodantin [Nitrofurantoin] Rash  *Some images could not be shown.

## 2024-03-29 NOTE — Nursing Note (Signed)
 LLE Measurements   Difference from last measurement Foot 24cm  -0.5cm Ankle 22.5cm  +1cm Calf 36.5cm -1.5cm Thigh 40cm  -1cm

## 2024-03-30 DIAGNOSIS — T63001A Toxic effect of unspecified snake venom, accidental (unintentional), initial encounter: Secondary | ICD-10-CM | POA: Diagnosis not present

## 2024-04-05 DIAGNOSIS — Z79899 Other long term (current) drug therapy: Secondary | ICD-10-CM | POA: Diagnosis not present

## 2024-04-05 DIAGNOSIS — T63001D Toxic effect of unspecified snake venom, accidental (unintentional), subsequent encounter: Secondary | ICD-10-CM | POA: Diagnosis not present

## 2024-04-19 DIAGNOSIS — N950 Postmenopausal bleeding: Secondary | ICD-10-CM | POA: Diagnosis not present

## 2024-04-21 DIAGNOSIS — Z0000 Encounter for general adult medical examination without abnormal findings: Secondary | ICD-10-CM | POA: Diagnosis not present

## 2024-04-21 DIAGNOSIS — E039 Hypothyroidism, unspecified: Secondary | ICD-10-CM | POA: Diagnosis not present

## 2024-04-28 DIAGNOSIS — K861 Other chronic pancreatitis: Secondary | ICD-10-CM | POA: Diagnosis not present

## 2024-04-28 DIAGNOSIS — W5911XD Bitten by nonvenomous snake, subsequent encounter: Secondary | ICD-10-CM | POA: Diagnosis not present

## 2024-04-28 DIAGNOSIS — Z0000 Encounter for general adult medical examination without abnormal findings: Secondary | ICD-10-CM | POA: Diagnosis not present

## 2024-04-28 DIAGNOSIS — Z8249 Family history of ischemic heart disease and other diseases of the circulatory system: Secondary | ICD-10-CM | POA: Diagnosis not present

## 2024-05-19 DIAGNOSIS — H524 Presbyopia: Secondary | ICD-10-CM | POA: Diagnosis not present

## 2024-05-19 DIAGNOSIS — H52223 Regular astigmatism, bilateral: Secondary | ICD-10-CM | POA: Diagnosis not present

## 2024-05-19 DIAGNOSIS — H5203 Hypermetropia, bilateral: Secondary | ICD-10-CM | POA: Diagnosis not present
